# Patient Record
Sex: Female | Born: 1990 | Race: Black or African American | Hispanic: No | Marital: Single | Smoking: Never smoker
Health system: Southern US, Community
[De-identification: ages and names within clinical notes are randomized; demographics above are authoritative.]

## PROBLEM LIST (undated history)

## (undated) ENCOUNTER — Ambulatory Visit: Admission: EM | Payer: BC Managed Care – PPO | Source: Home / Self Care

## (undated) DIAGNOSIS — B009 Herpesviral infection, unspecified: Secondary | ICD-10-CM

## (undated) DIAGNOSIS — K589 Irritable bowel syndrome without diarrhea: Secondary | ICD-10-CM

## (undated) DIAGNOSIS — A749 Chlamydial infection, unspecified: Secondary | ICD-10-CM

## (undated) DIAGNOSIS — K219 Gastro-esophageal reflux disease without esophagitis: Secondary | ICD-10-CM

## (undated) DIAGNOSIS — F988 Other specified behavioral and emotional disorders with onset usually occurring in childhood and adolescence: Secondary | ICD-10-CM

## (undated) DIAGNOSIS — B9689 Other specified bacterial agents as the cause of diseases classified elsewhere: Secondary | ICD-10-CM

## (undated) DIAGNOSIS — N760 Acute vaginitis: Secondary | ICD-10-CM

## (undated) DIAGNOSIS — I82409 Acute embolism and thrombosis of unspecified deep veins of unspecified lower extremity: Secondary | ICD-10-CM

## (undated) DIAGNOSIS — D649 Anemia, unspecified: Secondary | ICD-10-CM

## (undated) HISTORY — PX: NO PAST SURGERIES: SHX2092

## (undated) HISTORY — DX: Anemia, unspecified: D64.9

## (undated) HISTORY — DX: Chlamydial infection, unspecified: A74.9

---
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
## 2010-02-01 ENCOUNTER — Emergency Department (HOSPITAL_COMMUNITY)
Admission: EM | Admit: 2010-02-01 | Discharge: 2010-02-01 | Payer: Self-pay | Source: Home / Self Care | Admitting: Emergency Medicine

## 2011-11-27 ENCOUNTER — Telehealth: Payer: Self-pay | Admitting: Obstetrics and Gynecology

## 2011-11-28 ENCOUNTER — Ambulatory Visit (INDEPENDENT_AMBULATORY_CARE_PROVIDER_SITE_OTHER): Payer: BC Managed Care – PPO | Admitting: Obstetrics and Gynecology

## 2011-11-28 ENCOUNTER — Encounter: Payer: Self-pay | Admitting: Obstetrics and Gynecology

## 2011-11-28 VITALS — BP 92/60 | HR 60 | Ht 63.75 in | Wt 126.0 lb

## 2011-11-28 DIAGNOSIS — Z124 Encounter for screening for malignant neoplasm of cervix: Secondary | ICD-10-CM

## 2011-11-28 DIAGNOSIS — N926 Irregular menstruation, unspecified: Secondary | ICD-10-CM

## 2011-11-28 DIAGNOSIS — Z113 Encounter for screening for infections with a predominantly sexual mode of transmission: Secondary | ICD-10-CM

## 2011-11-28 DIAGNOSIS — Z01419 Encounter for gynecological examination (general) (routine) without abnormal findings: Secondary | ICD-10-CM

## 2011-11-28 NOTE — Progress Notes (Signed)
Regular Periods: no Mammogram: no  Monthly Breast Ex.: yes Exercise: yes  Tetanus < 10 years: yes Seatbelts: yes  NI. Bladder Functn.: yes Abuse at home: no  Daily BM's: yes Stressful Work: yes SCHOOL  Healthy Diet: yes Sigmoid-Colonoscopy: NO  Calcium: no Medical problems this year: CYCLE PROBLEMS   LAST PAP12/11  NL AT [facility]  Contraception: NONE  Mammogram:  NO  PCP: NO  PMH: NO CHANGE  FMH: NO CHANGE  Last Bone Scan: NO  PT IS SINGLE

## 2011-11-28 NOTE — Progress Notes (Signed)
Subjective:    Bridget Mcbride is a 21 y.o. female, G0P0, who presents for an annual exam. The patient reports skipping periods (up to 3 months) but recently has only missed 2 months.  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  Denies molimina during amenorrheic months nor a family history of oligomenorrhea.  Wants to resume birth control pills   Menstrual cycle:   LMP: [name] was 11/08/2011.             Review of Systems Pertinent items are noted in HPI. Denies pelvic pain, urinary tract symptoms, vaginitis symptoms, irregular bleeding, menopausal symptoms, change in bowel habits or rectal bleeding   Objective:    BP 92/60  Pulse 60  Ht 5' 3.75" (1.619 m)  Wt 126 lb (57.153 kg)  BMI 21.80 kg/m2  LMP 11/08/2011   Wt Readings from Last 1 Encounters:  11/28/11 126 lb (57.153 kg)   Body mass index is 21.80 kg/(m^2). General Appearance: Alert, no acute distress HEENT: Grossly normal Neck / Thyroid: Supple, no thyromegaly or cervical adenopathy Lungs: Clear to auscultation bilaterally Back: No CVA tenderness Breast Exam: No masses or nodes.No dimpling, nipple retraction or discharge. Cardiovascular: Regular rate and rhythm.  Gastrointestinal: Soft, non-tender, no masses or organomegaly Pelvic Exam: EGBUS-wnl, vagina-normal rugae, cervix- without lesions or tenderness, uterus appears normal size shape and consistency, adnexae-no masses or tenderness Rectovaginal: no masses and normal sphincter tone Lymphatic Exam: Non-palpable nodes in neck, clavicular,  axillary, or inguinal regions  Skin: no rashes or abnormalities Extremities: no clubbing cyanosis or edema  Neurologic: grossly normal Psychiatric: Alert and oriented  UPT: negative   Assessment:   Routine GYN Exam Irregular Menses   Plan:  STD testing  PAP sent  [facility]  BCP instruction sheet given  RTO 1 year or  prn  Shontel Santee,ELMIRAPA-C

## 2011-11-28 NOTE — Addendum Note (Signed)
Addended by: Tim Lair on: 11/28/2011 06:21 PM   Modules accepted: Orders

## 2011-11-29 LAB — RPR

## 2011-11-29 LAB — HIV ANTIBODY (ROUTINE TESTING W REFLEX): HIV: NONREACTIVE

## 2011-11-29 LAB — HSV 2 ANTIBODY, IGG: HSV 2 Glycoprotein G Ab, IgG: <0.1 IV

## 2011-11-30 LAB — PAP IG, CT-NG, RFX HPV ASCU
Chlamydia Probe Amp: NEGATIVE
GC Probe Amp: NEGATIVE

## 2011-12-02 NOTE — Progress Notes (Signed)
Quick Note:  Patient with LGSIL on PAP smear needs to be scheduled for colposcopy with first available M.D. Thanks, EP ______

## 2011-12-03 ENCOUNTER — Telehealth: Payer: Self-pay

## 2011-12-03 NOTE — Telephone Encounter (Signed)
TC TO PT CONCERNING ABNORMAL PAP. INFORMED PT THAT WE NEED TO SCHEDULE A COLPOSCOPY. WENT OVER COLPOSCOPY PROTOCOL AND SCHEDULED PT WITH ND ON 12-17-11 AT 2:45 PM. WILL MAIL PT A BROCHURE ON THE PROCEDURE. PT VOICED UNDERSTANDING.

## 2011-12-03 NOTE — Telephone Encounter (Signed)
LM FOR PT TO CALL BACK. 

## 2011-12-03 NOTE — Telephone Encounter (Signed)
Message copied by Winfred Leeds on Mon Dec 03, 2011 10:56 AM ------      Message from: Henreitta Leber      Created: Sun Dec 02, 2011  8:13 PM       Patient with LGSIL on PAP smear needs to be scheduled for colposcopy with first available M.D.  Thanks,  EP

## 2011-12-17 ENCOUNTER — Encounter: Payer: Self-pay | Admitting: Obstetrics and Gynecology

## 2011-12-17 ENCOUNTER — Ambulatory Visit (INDEPENDENT_AMBULATORY_CARE_PROVIDER_SITE_OTHER): Payer: BC Managed Care – PPO | Admitting: Obstetrics and Gynecology

## 2011-12-17 VITALS — BP 94/50 | Wt 125.0 lb

## 2011-12-17 DIAGNOSIS — R87612 Low grade squamous intraepithelial lesion on cytologic smear of cervix (LGSIL): Secondary | ICD-10-CM

## 2011-12-17 DIAGNOSIS — R6889 Other general symptoms and signs: Secondary | ICD-10-CM

## 2011-12-17 DIAGNOSIS — IMO0002 Reserved for concepts with insufficient information to code with codable children: Secondary | ICD-10-CM

## 2011-12-17 NOTE — Progress Notes (Signed)
Previous Pap Smear: LSIL HPV MILD DYSPLASIA/CIN1 Previous Colposcopy: n/a Referred From: n/a LMP: 11/08/11 Contraception: none G,P: 0, 0 Pt given Ibuprofen 600 mg in office colpo adequate Aw changes at 6  bx @6  with ecc Rt 6 months for pap Will call with results

## 2011-12-17 NOTE — Patient Instructions (Signed)
Abnormal Pap Test Information  During a Pap test, the cells on the surface of your cervix are checked to see if they look normal, abnormal, or if they show signs of having been altered by a certain type of virus called human papillomavirus, or HPV. Cervical cells that have been affected by HPV are called dysplasia. Dysplasia is not cancer, but describes abnormal cells found on the surface of the cervix. Depending on the degree of dysplasia, some of the cells may be considered pre-cancerous and may turn into cancer over time if follow up with a caregiver is delayed.   WHAT DOES AN ABNORMAL PAP TEST MEAN?  Having an abnormal pap test does not mean that you have cancer. However, certain types of abnormal pap tests can be a sign that a person is at a higher risk of developing cancer. Your caregiver will want to do other tests to find out more about the abnormal cells. Your abnormal Pap test results could show:   · Small and uncertain changes that should be carefully watched.    · Cervical dysplasia that has caused mild changes and can be followed over time.    · Cervical dysplasia that is more severe and needs to be followed and treated to ensure the problem goes away.  · Cancer.    When severe cervical dysplasia is found and treated early, it rarely will grow into cancer.   WHAT WILL BE DONE ABOUT MY ABNORMAL PAP TEST?  · A colposcopy may be needed. This is a procedure where your cervix is examined using light and magnification.  · A small tissue sample of your cervix (biopsy) may need to be removed and then examined. This is often performed if there are areas that appear infected.  · A sample of cells from the cervical canal may be removed with either a small brush or scraping instrument (curette).  Based on the results of the procedures above, some caregivers may recommend either cryotherapy of the cervix or a surgical LEEP where a portion of the cervix is removed. LEEP is short for "loop electrical excisional  procedure." Rarely, a caregiver may recommend a cone biopsy. This is a procedure where a small, cone-shaped sample of your cervix is taken out. The part that is taken out is the area where the abnormal cells are.    WHAT IF I HAVE A DYSPLASIA OR A CANCER?  You may be referred to a specialist. Radiation may also be a treatment for more advanced cancer. Having a hysterectomy is the last treatment option for dysplasia, but it is a more common treatment for someone with cancer. All treatment options will be discussed with you by your caregiver.  WHAT SHOULD YOU DO AFTER BEING TREATED?  If you have had an abnormal pap test, you should continue to have regular pap tests and check-ups as directed by your caregiver. Your cervical problem will be carefully watched so it does not get worse. Also, your caregiver can watch for, and treat, any new problems that may come up.  Document Released: 05/23/2010 Document Revised: 04/30/2011 Document Reviewed: 02/01/2011  ExitCare® Patient Information ©2013 [employer].

## 2011-12-23 ENCOUNTER — Encounter (HOSPITAL_COMMUNITY): Payer: Self-pay | Admitting: *Deleted

## 2011-12-23 ENCOUNTER — Emergency Department (INDEPENDENT_AMBULATORY_CARE_PROVIDER_SITE_OTHER)
Admission: EM | Admit: 2011-12-23 | Discharge: 2011-12-23 | Disposition: A | Discharge status: Home / Self Care | Payer: Self-pay | Source: Home / Self Care | Attending: Emergency Medicine | Admitting: Emergency Medicine

## 2011-12-23 DIAGNOSIS — M549 Dorsalgia, unspecified: Secondary | ICD-10-CM

## 2011-12-23 MED ORDER — IBUPROFEN 800 MG PO TABS
ORAL_TABLET | ORAL | Status: AC
  Filled 2011-12-23: qty 1

## 2011-12-23 MED ORDER — IBUPROFEN 800 MG PO TABS
800.0000 mg | ORAL_TABLET | Freq: Once | ORAL | Status: AC
  Administered 2011-12-23: 800 mg via ORAL

## 2011-12-23 MED ORDER — CYCLOBENZAPRINE HCL 5 MG PO TABS: 5.0000 mg | ORAL_TABLET | Freq: Three times a day (TID) | ORAL | Status: DC | PRN

## 2011-12-23 MED ORDER — NAPROXEN 375 MG PO TABS: 375.0000 mg | ORAL_TABLET | Freq: Two times a day (BID) | ORAL | Status: DC
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
## 2011-12-23 NOTE — ED Provider Notes (Signed)
Medical screening examination/treatment/procedure(s) were performed by non-physician practitioner and as supervising physician I was immediately available for consultation/collaboration.  Raynald Blend, MD 12/23/11 1642

## 2011-12-24 LAB — POCT URINALYSIS DIP (DEVICE)
Hgb urine dipstick: NEGATIVE
Protein, ur: 30 mg/dL — AB
Specific Gravity, Urine: 1.025 (ref 1.005–1.030)
pH: 6.5 (ref 5.0–8.0)

## 2011-12-26 ENCOUNTER — Telehealth: Payer: Self-pay

## 2011-12-26 NOTE — Telephone Encounter (Signed)
Message copied by Rolla Plate on Wed Dec 26, 2011  9:12 AM ------      Message from: Jaymes Graff      Created: Mon Dec 24, 2011  8:28 PM       Please review colpo results with patient and tell her I recommend a pap every six months for the next year.

## 2011-12-26 NOTE — Telephone Encounter (Signed)
Lm on vm tcb rgd labs 

## 2011-12-31 NOTE — Telephone Encounter (Signed)
Spoke with pt rgd labs informed repeat pap every 6 months pt voice understanding 

## 2011-12-31 NOTE — Telephone Encounter (Signed)
Lm on vm tcb rgd labs 

## 2012-02-18 ENCOUNTER — Telehealth: Payer: Self-pay | Admitting: Obstetrics and Gynecology

## 2012-02-18 NOTE — Telephone Encounter (Signed)
Spoke with pt Bridget Mcbride msg pt c/o vag irritation pt has appt 02/29/12 with nd pt voice understanding

## 2012-02-29 ENCOUNTER — Encounter: Payer: BC Managed Care – PPO | Admitting: Obstetrics and Gynecology

## 2012-02-29 ENCOUNTER — Telehealth: Payer: Self-pay | Admitting: Obstetrics and Gynecology

## 2012-02-29 NOTE — Telephone Encounter (Signed)
Tc to pt per telephone call. Appt sched 03/03/12 @ 3:15 with ND for eval. Pt agrees

## 2012-03-03 ENCOUNTER — Encounter: Payer: Self-pay | Admitting: Obstetrics and Gynecology

## 2012-03-03 ENCOUNTER — Ambulatory Visit (INDEPENDENT_AMBULATORY_CARE_PROVIDER_SITE_OTHER): Payer: BC Managed Care – PPO | Admitting: Obstetrics and Gynecology

## 2012-03-03 VITALS — BP 84/48 | Wt 123.0 lb

## 2012-03-03 DIAGNOSIS — N898 Other specified noninflammatory disorders of vagina: Secondary | ICD-10-CM

## 2012-03-03 DIAGNOSIS — A499 Bacterial infection, unspecified: Secondary | ICD-10-CM

## 2012-03-03 DIAGNOSIS — N760 Acute vaginitis: Secondary | ICD-10-CM

## 2012-03-03 DIAGNOSIS — B9689 Other specified bacterial agents as the cause of diseases classified elsewhere: Secondary | ICD-10-CM

## 2012-03-03 LAB — POCT WET PREP (WET MOUNT)
Clue Cells Wet Prep Whiff POC: POSITIVE
PH, VAGINAL: 5.5

## 2012-03-03 MED ORDER — METRONIDAZOLE 500 MG PO TABS: 500.0000 mg | ORAL_TABLET | Freq: Two times a day (BID) | ORAL | Status: DC

## 2012-03-03 NOTE — Patient Instructions (Signed)
Bacterial Vaginosis Bacterial vaginosis (BV) is a vaginal infection where the normal balance of bacteria in the vagina is disrupted. The normal balance is then replaced by an overgrowth of certain bacteria. There are several different kinds of bacteria that can cause BV. BV is the most common vaginal infection in women of childbearing age. CAUSES   The cause of BV is not fully understood. BV develops when there is an increase or imbalance of harmful bacteria.  Some activities or behaviors can upset the normal balance of bacteria in the vagina and put women at increased risk including:  Having a new sex partner or multiple sex partners.  Douching.  Using an intrauterine device (IUD) for contraception.  It is not clear what role sexual activity plays in the development of BV. However, women that have never had sexual intercourse are rarely infected with BV. Women do not get BV from toilet seats, bedding, swimming pools or from touching objects around them.  SYMPTOMS   Grey vaginal discharge.  A fish-like odor with discharge, especially after sexual intercourse.  Itching or burning of the vagina and vulva.  Burning or pain with urination.  Some women have no signs or symptoms at all. DIAGNOSIS  Your caregiver must examine the vagina for signs of BV. Your caregiver will perform lab tests and look at the sample of vaginal fluid through a microscope. They will look for bacteria and abnormal cells (clue cells), a pH test higher than 4.5, and a positive amine test all associated with BV.  RISKS AND COMPLICATIONS   Pelvic inflammatory disease (PID).  Infections following gynecology surgery.  Developing HIV.  Developing herpes virus. TREATMENT  Sometimes BV will clear up without treatment. However, all women with symptoms of BV should be treated to avoid complications, especially if gynecology surgery is planned. Female partners generally do not need to be treated. However, BV may spread  between female sex partners so treatment is helpful in preventing a recurrence of BV.   BV may be treated with antibiotics. The antibiotics come in either pill or vaginal cream forms. Either can be used with nonpregnant or pregnant women, but the recommended dosages differ. These antibiotics are not harmful to the baby.  BV can recur after treatment. If this happens, a second round of antibiotics will often be prescribed.  Treatment is important for pregnant women. If not treated, BV can cause a premature delivery, especially for a pregnant woman who had a premature birth in the past. All pregnant women who have symptoms of BV should be checked and treated.  For chronic reoccurrence of BV, treatment with a type of prescribed gel vaginally twice a week is helpful. HOME CARE INSTRUCTIONS   Finish all medication as directed by your caregiver.  Do not have sex until treatment is completed.  Tell your sexual partner that you have a vaginal infection. They should see their caregiver and be treated if they have problems, such as a mild rash or itching.  Practice safe sex. Use condoms. Only have 1 sex partner. PREVENTION  Basic prevention steps can help reduce the risk of upsetting the natural balance of bacteria in the vagina and developing BV:  Do not have sexual intercourse (be abstinent).  Do not douche.  Use all of the medicine prescribed for treatment of BV, even if the signs and symptoms go away.  Tell your sex partner if you have BV. That way, they can be treated, if needed, to prevent reoccurrence. SEEK MEDICAL CARE IF:     Your symptoms are not improving after 3 days of treatment.  You have increased discharge, pain, or fever. MAKE SURE YOU:   Understand these instructions.  Will watch your condition.  Will get help right away if you are not doing well or get worse. FOR MORE INFORMATION  Division of STD Prevention (DSTDP), Centers for Disease Control and Prevention:  www.cdc.gov/std American Social Health Association (ASHA): www.ashastd.org  Document Released: 02/05/2005 Document Revised: 04/30/2011 Document Reviewed: 07/29/2008 ExitCare Patient Information 2013 [employer].  

## 2012-03-03 NOTE — Progress Notes (Signed)
Color: white Odor: yes Itching:no Thin:yes Thick:no Fever:no Dyspareunia:no Hx PID:no HX STD:no Pelvic Pain:no Desires Gc/CT:no Desires HIV,RPR,HbsAG:no Pt would like to be checked for bv.  She states that the discharge has been present for two months BP 84/48  Wt 123 lb (55.792 kg)  LMP 12/01/2011 Physical Examination: Abdomen - soft, nontender, nondistended, no masses or organomegaly Physical Examination: Pelvic - normal external genitalia, vulva, vagina, cervix, uterus and adnexa, VULVA: normal appearing vulva with no masses, tenderness or lesions Physical Examination: Pelvic - normal external genitalia, vulva, vagina, cervix, uterus and adnexa, VAGINA: normal appearing vagina with normal color and discharge, no lesions, vaginal discharge - white and malodorous, WET MOUNT done - results: clue cells, CERVIX: normal appearing cervix without discharge or lesions, UTERUS: uterus is normal size, shape, consistency and nontender, ADNEXA: normal adnexa in size, nontender and no masses osom positive for BV BV Flagyl rx given to pt.  Perineal hygeine reviewed

## 2012-03-17 ENCOUNTER — Encounter: Payer: BC Managed Care – PPO | Admitting: Obstetrics and Gynecology

## 2012-04-02 ENCOUNTER — Telehealth: Payer: Self-pay | Admitting: Obstetrics and Gynecology

## 2012-04-02 NOTE — Telephone Encounter (Signed)
Lm on vm tcb rgd msg 

## 2012-04-08 ENCOUNTER — Ambulatory Visit: Payer: BC Managed Care – PPO | Admitting: Family Medicine

## 2012-04-08 ENCOUNTER — Encounter: Payer: Self-pay | Admitting: Family Medicine

## 2012-04-08 VITALS — BP 92/62 | Ht 64.0 in | Wt 123.0 lb

## 2012-04-08 DIAGNOSIS — N898 Other specified noninflammatory disorders of vagina: Secondary | ICD-10-CM

## 2012-04-08 DIAGNOSIS — N760 Acute vaginitis: Secondary | ICD-10-CM

## 2012-04-08 LAB — POCT WET PREP (WET MOUNT)
Clue Cells Wet Prep Whiff POC: POSITIVE
pH: 5

## 2012-04-08 NOTE — Progress Notes (Signed)
Vaginal Discharge/Discomfort/Itching  Subjective: BP 92/62  Ht 5\' 4"  (1.626 m)  Wt 123 lb (55.792 kg)  BMI 21.1 kg/m2  LMP 11/02/2011    Bridget Mcbride is an 22 y.o. woman who presents c/o vaginal discharge: large amount, thin white to grey with bad odor. Symptoms have been present for 2 days.  Seen in the office 3 weeks ago and used the treatment 2 weeks ago with improvement x 2 days and smell returned.  Reports following hygiene measures and changing workout clothes as soon as possible. Patient reports this has happened often and want a cure.  Associated symptoms: no pelvic pain, dyspareunia, or bleeding.  States irregular menses for last 2-3 years and uses no contraception.  STD screen:declined       Objective: No hair d/t shaving. Vagina: WNL, Labia: covered with whitish-grey fishy discharge. BSU: normal. Cervix: nonparous os, No CMT,  No discharge.  Uterus:WNL. No adnexa tenderness or masses.    Wet prep: vaginal pH is 5.5,Wet mount positive for clues, no yeast.  Plan: 1. BV: Will treat with Metrogel 0.75% qhs intravaginally for 5 days. Rx given.  Encouraged patient to avoid intercourse during treatment.   Discussed with may need prophylactic treatment which includes, metronidazole gel twice weekly for 4-6 months, which has been shown to reduce recurrences, although this benefit might not persist when suppressive therapy is discontinued.   2. Patient declines PT and contraception at this time.   Counseling:  treatment options Metrogel vs. Pill  Follow-up: prn   Elane Fritz FNP-BC 04/08/2012 1:54 PM

## 2012-04-08 NOTE — Patient Instructions (Signed)
Bacterial Vaginosis Bacterial vaginosis (BV) is a vaginal infection where the normal balance of bacteria in the vagina is disrupted. The normal balance is then replaced by an overgrowth of certain bacteria. There are several different kinds of bacteria that can cause BV. BV is the most common vaginal infection in women of childbearing age. CAUSES   The cause of BV is not fully understood. BV develops when there is an increase or imbalance of harmful bacteria.  Some activities or behaviors can upset the normal balance of bacteria in the vagina and put women at increased risk including:  Having a new sex partner or multiple sex partners.  Douching.  Using an intrauterine device (IUD) for contraception.  It is not clear what role sexual activity plays in the development of BV. However, women that have never had sexual intercourse are rarely infected with BV. Women do not get BV from toilet seats, bedding, swimming pools or from touching objects around them.  SYMPTOMS   Grey vaginal discharge.  A fish-like odor with discharge, especially after sexual intercourse.  Itching or burning of the vagina and vulva.  Burning or pain with urination.  Some women have no signs or symptoms at all. DIAGNOSIS  Your caregiver must examine the vagina for signs of BV. Your caregiver will perform lab tests and look at the sample of vaginal fluid through a microscope. They will look for bacteria and abnormal cells (clue cells), a pH test higher than 4.5, and a positive amine test all associated with BV.  RISKS AND COMPLICATIONS   Pelvic inflammatory disease (PID).  Infections following gynecology surgery.  Developing HIV.  Developing herpes virus. TREATMENT  Sometimes BV will clear up without treatment. However, all women with symptoms of BV should be treated to avoid complications, especially if gynecology surgery is planned. Female partners generally do not need to be treated. However, BV may spread  between female sex partners so treatment is helpful in preventing a recurrence of BV.   BV may be treated with antibiotics. The antibiotics come in either pill or vaginal cream forms. Either can be used with nonpregnant or pregnant women, but the recommended dosages differ. These antibiotics are not harmful to the baby.  BV can recur after treatment. If this happens, a second round of antibiotics will often be prescribed.  Treatment is important for pregnant women. If not treated, BV can cause a premature delivery, especially for a pregnant woman who had a premature birth in the past. All pregnant women who have symptoms of BV should be checked and treated.  For chronic reoccurrence of BV, treatment with a type of prescribed gel vaginally twice a week is helpful. HOME CARE INSTRUCTIONS   Finish all medication as directed by your caregiver.  Do not have sex until treatment is completed.  Tell your sexual partner that you have a vaginal infection. They should see their caregiver and be treated if they have problems, such as a mild rash or itching.  Practice safe sex. Use condoms. Only have 1 sex partner. PREVENTION  Basic prevention steps can help reduce the risk of upsetting the natural balance of bacteria in the vagina and developing BV:  Do not have sexual intercourse (be abstinent).  Do not douche.  Use all of the medicine prescribed for treatment of BV, even if the signs and symptoms go away.  Tell your sex partner if you have BV. That way, they can be treated, if needed, to prevent reoccurrence. SEEK MEDICAL CARE IF:     Your symptoms are not improving after 3 days of treatment.  You have increased discharge, pain, or fever. MAKE SURE YOU:   Understand these instructions.  Will watch your condition.  Will get help right away if you are not doing well or get worse. FOR MORE INFORMATION  Division of STD Prevention (DSTDP), Centers for Disease Control and Prevention:  www.cdc.gov/std American Social Health Association (ASHA): www.ashastd.org  Document Released: 02/05/2005 Document Revised: 04/30/2011 Document Reviewed: 07/29/2008 ExitCare Patient Information 2013 [employer].  

## 2012-04-09 ENCOUNTER — Encounter: Payer: BC Managed Care – PPO | Admitting: Family Medicine

## 2012-04-17 ENCOUNTER — Other Ambulatory Visit: Payer: Self-pay | Admitting: Family Medicine

## 2012-04-17 ENCOUNTER — Telehealth: Payer: Self-pay | Admitting: Family Medicine

## 2012-04-17 DIAGNOSIS — B3731 Acute candidiasis of vulva and vagina: Secondary | ICD-10-CM

## 2012-04-17 MED ORDER — FLUCONAZOLE 150 MG PO TABS: 150.0000 mg | ORAL_TABLET | Freq: Once | ORAL | Status: DC

## 2012-04-17 NOTE — Telephone Encounter (Signed)
Returned pt's call regarding symptoms of BV returned. Pt stated that she finished RX for BV and is now having thick white discharge. Pt was advised per NP LC to pick-up RX for yeast infection, Diflucan (to be E-scribed) Pt to call the office if symptoms do not resolve or worsen. Pt voice understanding. Mathis Bud

## 2012-05-01 ENCOUNTER — Ambulatory Visit: Payer: BC Managed Care – PPO | Admitting: Obstetrics and Gynecology

## 2012-05-01 ENCOUNTER — Encounter: Payer: Self-pay | Admitting: Obstetrics and Gynecology

## 2012-05-01 VITALS — BP 110/70 | HR 68 | Wt 126.0 lb

## 2012-05-01 DIAGNOSIS — N912 Amenorrhea, unspecified: Secondary | ICD-10-CM

## 2012-05-01 DIAGNOSIS — R590 Localized enlarged lymph nodes: Secondary | ICD-10-CM

## 2012-05-01 DIAGNOSIS — N9089 Other specified noninflammatory disorders of vulva and perineum: Secondary | ICD-10-CM

## 2012-05-01 DIAGNOSIS — N898 Other specified noninflammatory disorders of vagina: Secondary | ICD-10-CM

## 2012-05-01 DIAGNOSIS — N915 Oligomenorrhea, unspecified: Secondary | ICD-10-CM

## 2012-05-01 LAB — CBC
HCT: 42.3 % (ref 36.0–46.0)
Hemoglobin: 14 g/dL (ref 12.0–15.0)
MCHC: 33.1 g/dL (ref 30.0–36.0)
MCV: 86.5 fL (ref 78.0–100.0)
WBC: 6.4 10*3/uL (ref 4.0–10.5)

## 2012-05-01 LAB — PROGESTERONE: Progesterone: 0.4 ng/mL

## 2012-05-01 LAB — LUTEINIZING HORMONE: LH: 37 m[IU]/mL

## 2012-05-01 LAB — PROLACTIN: Prolactin: 9.9 ng/mL

## 2012-05-01 MED ORDER — VALACYCLOVIR HCL 500 MG PO TABS: ORAL_TABLET | ORAL | Status: DC

## 2012-05-01 MED ORDER — ACYCLOVIR 5 % EX CREA: 1.0000 "application " | TOPICAL_CREAM | CUTANEOUS | Status: DC

## 2012-05-01 NOTE — Progress Notes (Signed)
21 YO complains of an asymptomatic vaginal discharge and amenorrhea (LMP: 11/02/11).  Has a history of oligomenorrhea but has never been worked up. Menstrual flow is for 6 days with pad change every 3-4 hours  O: Abdomen: soft, non-tender without masses      Pelvic: EGBUS-tender erosion in right inferior vaginal area, vagina-normal with copious white discharge, cervix-no lesions and without tenderness,    uterus-normal size and adnexae-no tenderness or masses  Wet Prep:  ph-4.5,  whiff-negative HSV culture-pending   A: Oligomenorrhea      Recurrent Vulvar Lesions      Recurrent Inguinal Adenopathy      H/O HSV-1 (unknown site)   P:  [facility]       Reviewed physiologic vaginal discharge       Zovirax 5% 15 gm  apply every 3 hours x 10 days      Valtrex 500 mg  #30  bid x 3-5 days prn       HSV culture pending       RTO-as scheduled or prn  Tynslee Bowlds, PA-C

## 2012-05-01 NOTE — Progress Notes (Signed)
Vaginal discharge: mucoidthick mucoid Itching / Burning: no Fever: no  Symptoms have been present for 5 days. Has used over-the-counter treatment: no Associated symptoms:  Pelvic pain: no       Dyspareunia: no     Odor:  no  History of STD:  history of chlamydia STD screen:declined  Pt states she had a little knot on her right side  Over the weekend. Pt states the knot come and go. Also pt has not had a cycle since 9/13.

## 2012-05-01 NOTE — Patient Instructions (Signed)
Avoid: - excess soap on genital area (consider using plain oatmeal soap) - use of powder or sprays in genital area - douching - wearing underwear to bed (except with menses) - using more than is directed detergent when washing clothes - tight fitting garments around genital area - excess sugar intake   

## 2012-05-02 LAB — TSH: TSH: 2.836 u[IU]/mL (ref 0.350–4.500)

## 2012-05-02 LAB — TESTOSTERONE, FREE, TOTAL, SHBG
Testosterone-% Free: 1.1 % (ref 0.4–2.4)
Testosterone: 74 ng/dL — ABNORMAL HIGH (ref 10–70)

## 2012-05-02 LAB — HSV 2 ANTIBODY, IGG: HSV 2 Glycoprotein G Ab, IgG: 0.18 IV

## 2012-05-06 ENCOUNTER — Telehealth: Payer: Self-pay

## 2012-05-06 NOTE — Telephone Encounter (Signed)
Message copied by Rolla Plate on Tue May 06, 2012 11:01 AM ------      Message from: Henreitta Leber      Created: Mon May 05, 2012  8:55 AM       Please advise patient that she has polycystic ovarian syndrome and elevated testosterone.  If  she would like to discuss further, she can make an appointment.  Thank you,  EP ------

## 2012-05-06 NOTE — Telephone Encounter (Signed)
Spoke with pt rgd labs informed results pt has appt 05/14/12 at 2:45 with EP to discuss results pt voice understanding

## 2012-09-26 ENCOUNTER — Emergency Department (HOSPITAL_COMMUNITY)
Admission: EM | Admit: 2012-09-26 | Discharge: 2012-09-26 | Disposition: A | Discharge status: Home / Self Care | Payer: BC Managed Care – PPO | Source: Home / Self Care | Attending: Family Medicine | Admitting: Family Medicine

## 2012-09-26 ENCOUNTER — Encounter (HOSPITAL_COMMUNITY): Payer: Self-pay | Admitting: Emergency Medicine

## 2012-09-26 DIAGNOSIS — K644 Residual hemorrhoidal skin tags: Secondary | ICD-10-CM

## 2012-09-26 MED ORDER — LOPERAMIDE HCL 2 MG PO CAPS: 2.0000 mg | ORAL_CAPSULE | Freq: Four times a day (QID) | ORAL | Status: DC | PRN

## 2012-09-26 MED ORDER — TRIAMCINOLONE ACETONIDE 0.5 % EX OINT: TOPICAL_OINTMENT | Freq: Two times a day (BID) | CUTANEOUS | Status: DC

## 2012-09-26 MED ORDER — LIDOCAINE 5 % EX OINT
TOPICAL_OINTMENT | CUTANEOUS | Status: DC | PRN
Start: 2012-09-26 — End: 2013-08-03

## 2012-09-26 NOTE — ED Provider Notes (Signed)
CSN: [id number]     Arrival date & time 09/26/12  1132 History     First MD Initiated Contact with Patient 09/26/12 1204     Chief Complaint  Patient presents with  . Hemorrhoids   (Consider location/radiation/quality/duration/timing/severity/associated sxs/prior Treatment) HPI Comments: 22 year old female here complaining of rectal tenderness, swelling and pain since yesterday. States she has had constipation for several days followed or diarrhea in the last few days after completing antibiotics for Chlamydia infection about 2 weeks ago. Has not used any type of medications for her symptoms. Still having 1-3 small runny bowel movements per day. Denies abdominal pain. No nausea vomiting.   Past Medical History  Diagnosis Date  . Chlamydia   . Anemia    History reviewed. No pertinent past surgical history. Family History  Problem Relation Age of Onset  . Hypertension Mother   . Migraines Father   . Hypertension Maternal Aunt   . Hypertension Maternal Uncle   . Stroke Maternal Uncle   . Diabetes Paternal Aunt   . Arthritis Paternal Grandmother   . Hypertension Maternal Aunt   . Hypertension Maternal Aunt   . Hypertension Maternal Aunt   . Stroke Cousin    History  Substance Use Topics  . Smoking status: Never Smoker   . Smokeless tobacco: Never Used  . Alcohol Use: Yes     Comment: occasional   OB History   Grav Para Term Preterm Abortions TAB SAB Ect Mult Living   0              Review of Systems  Constitutional: Negative for fever, chills and appetite change.  Gastrointestinal: Positive for diarrhea and rectal pain. Negative for nausea, vomiting and abdominal pain.  Skin: Negative for rash.  All other systems reviewed and are negative.    Allergies  Review of patient's allergies indicates no known allergies.  Home Medications   Current Outpatient Rx  Name  Route  Sig  Dispense  Refill  . drospirenone-ethinyl estradiol (YASMIN,ZARAH,SYEDA) 3-0.03 MG tablet   Oral   Take 1 tablet by mouth daily.         Marland Kitchen acyclovir cream (ZOVIRAX) 5 %   Topical   Apply 1 application topically every 3 (three) hours.   15 g   0   . lidocaine (XYLOCAINE) 5 % ointment   Topical   Apply topically as needed.   35.44 g   0   . loperamide (IMODIUM) 2 MG capsule   Oral   Take 1 capsule (2 mg total) by mouth 4 (four) times daily as needed for diarrhea or loose stools.   30 capsule   0   . triamcinolone ointment (KENALOG) 0.5 %   Topical   Apply topically 2 (two) times daily.   30 g   0   . valACYclovir (VALTREX) 500 MG tablet      1 po bid  3-5 days   30 tablet   11    BP 105/70  Pulse 75  Temp(Src) 99.3 F (37.4 C) (Oral)  Resp 18  SpO2 98%  LMP 09/02/2012 Physical Exam  Nursing note and vitals reviewed. Constitutional: She is oriented to person, place, and time. She appears well-developed and well-nourished. No distress.  HENT:  Head: Normocephalic and atraumatic.  Cardiovascular: Normal heart sounds.   Pulmonary/Chest: Breath sounds normal.  Abdominal: Soft. Bowel sounds are normal. She exhibits no distension and no mass. There is no tenderness.  Genitourinary:  Rectal exam: There is a  large external hemorrhoid at 6:00 o'clock. Appears pink swollen and irritated, fluctuant, no hard consistency, tender to palpation. No mucosal ulcers or exudates. Anal tone is normal. No internal hemorrhoids felt, no rectal masses there is no stools in the vault.  Neurological: She is alert and oriented to person, place, and time.  Skin: No rash noted. She is not diaphoretic.    ED Course   Procedures (including critical care time)  Labs Reviewed - No data to display No results found. 1. Inflamed external hemorrhoid     MDM  Impress irritated external hemorrhoid. Prescribed lidocaine ointment and triamcinolone ointment. As per patient request also prescribe Imodium to help stop diarrhea. Supportive care and red flags that should prompt her  return to medical attention discussed with patient and provided in writing.  Sharin Grave, MD 09/26/12 1313

## 2012-09-26 NOTE — ED Notes (Signed)
Pt c/o external hemorrhoids onset yest. Reports she has been constipated and having diarrhea for the past 2 weeks sxs include: tenderness, swelling at rectum area... Denies: blood in toilet/toilet paper Taking OTC meds for diarrhea... Alert w/no signs of acute distress.

## 2012-09-28 ENCOUNTER — Emergency Department (INDEPENDENT_AMBULATORY_CARE_PROVIDER_SITE_OTHER)
Admission: EM | Admit: 2012-09-28 | Discharge: 2012-09-28 | Discharge status: Against Medical Advice | Payer: BC Managed Care – PPO | Source: Home / Self Care | Attending: Emergency Medicine | Admitting: Emergency Medicine

## 2012-09-28 ENCOUNTER — Emergency Department (INDEPENDENT_AMBULATORY_CARE_PROVIDER_SITE_OTHER): Payer: BC Managed Care – PPO

## 2012-09-28 ENCOUNTER — Encounter (HOSPITAL_COMMUNITY): Payer: Self-pay | Admitting: *Deleted

## 2012-09-28 DIAGNOSIS — R1031 Right lower quadrant pain: Secondary | ICD-10-CM

## 2012-09-28 HISTORY — DX: Other specified bacterial agents as the cause of diseases classified elsewhere: N76.0

## 2012-09-28 HISTORY — DX: Gastro-esophageal reflux disease without esophagitis: K21.9

## 2012-09-28 HISTORY — DX: Other specified bacterial agents as the cause of diseases classified elsewhere: B96.89

## 2012-09-28 LAB — POCT URINALYSIS DIP (DEVICE)
Glucose, UA: NEGATIVE mg/dL
Hgb urine dipstick: NEGATIVE
Ketones, ur: NEGATIVE mg/dL
Specific Gravity, Urine: 1.025 (ref 1.005–1.030)

## 2012-09-28 LAB — POCT PREGNANCY, URINE: Preg Test, Ur: NEGATIVE

## 2012-09-28 LAB — OCCULT BLOOD, POC DEVICE: Fecal Occult Bld: NEGATIVE

## 2012-09-28 NOTE — ED Notes (Signed)
Pt states she will sign out AMA and "go in the morning".  Risks again reviewed with pt.  Informed pt she needs to go to ED immediately for any worsening sxs.  Pt verbalized understanding.

## 2012-09-28 NOTE — ED Provider Notes (Signed)
Medical screening examination/treatment/procedure(s) were performed by -physician practitioner and as supervising physician I was immediately available for consultation/collaboration. We discuss this case and they some information and providers exam seem to be reasonable to pursue further imaging studies. I agreed with plan of care as it transfer the patient to the emergency department.  Raynald Blend, MD 09/28/12 [phone]

## 2012-09-28 NOTE — ED Notes (Signed)
States was told to f/u if any abd pain.  States started with intermittent generalized abd cramping yesterday.  Denies any blood in stool; states stool is "stringy".  Has taken 4 doses Imodium with only slight decrease in frequency of diarrhea.  Has some nausea, but denies vomiting or fevers.  Denies vaginal discharge - reports being treated for BV 2 wks ago.

## 2012-09-28 NOTE — ED Notes (Signed)
Pt states she is unsure if she wants to go to ED due to co-pay; informed pt of risk of not having further evaluation and that she will have to sign out AMA if she chooses to forego further treatment.  Pt discussing with family member.

## 2012-09-28 NOTE — ED Provider Notes (Signed)
CSN: [id number]     Arrival date & time 09/28/12  1348 History     None    Chief Complaint  Patient presents with  . Abdominal Pain    HPI Pt is 22 yo F presenting with progressively worsening abdominal pain. She was evaluated at [facility] 2 days ago for complaint of rectal pain, constipation with intermittent diarrhea. She has been taking imodium as well as steroid cream. Her abd pain has been getting worse and is now diffuse cramping pain.  Previously her abd pain was only with bowel movement. Now she has sharp pain in lower abdomen. Pain is worse before the bowel movement. Pain is better if she is lying flat. She has never had anything like this before.  Currently her bowel movements have been "stringy" for the last week. She states she would have loose bowel movements then feel like she still needed to defecate (almost like she was constipated.) She has been taking Imodium and stool is getting harder. No travel, no new foods. No fevers, no sick contacts. She took Flagyl 2 weeks ago but states she had diarrhea before then.   Past Medical History  Diagnosis Date  . Chlamydia   . Anemia   . BV (bacterial vaginosis)   . GERD (gastroesophageal reflux disease)    History reviewed. No pertinent past surgical history. Family History  Problem Relation Age of Onset  . Hypertension Mother   . Migraines Father   . Hypertension Maternal Aunt   . Hypertension Maternal Uncle   . Stroke Maternal Uncle   . Diabetes Paternal Aunt   . Arthritis Paternal Grandmother   . Hypertension Maternal Aunt   . Hypertension Maternal Aunt   . Hypertension Maternal Aunt   . Stroke Cousin    History  Substance Use Topics  . Smoking status: Never Smoker   . Smokeless tobacco: Never Used  . Alcohol Use: Yes     Comment: occasional   OB History   Grav Para Term Preterm Abortions TAB SAB Ect Mult Living   0              Review of Systems  Allergies  Review of patient's allergies indicates no known  allergies.  Home Medications   Current Outpatient Rx  Name  Route  Sig  Dispense  Refill  . drospirenone-ethinyl estradiol (YASMIN,ZARAH,SYEDA) 3-0.03 MG tablet   Oral   Take 1 tablet by mouth daily.         Marland Kitchen lidocaine (XYLOCAINE) 5 % ointment   Topical   Apply topically as needed.   35.44 g   0   . loperamide (IMODIUM) 2 MG capsule   Oral   Take 1 capsule (2 mg total) by mouth 4 (four) times daily as needed for diarrhea or loose stools.   30 capsule   0   . triamcinolone ointment (KENALOG) 0.5 %   Topical   Apply topically 2 (two) times daily.   30 g   0   . acyclovir cream (ZOVIRAX) 5 %   Topical   Apply 1 application topically every 3 (three) hours.   15 g   0   . valACYclovir (VALTREX) 500 MG tablet      1 po bid  3-5 days   30 tablet   11    BP 113/66  Pulse 84  Temp(Src) 99.8 F (37.7 C) (Oral)  Resp 18  SpO2 95%  LMP 09/02/2012 Physical Exam  Constitutional: She is oriented to person,  place, and time. She appears well-developed and well-nourished. No distress.  HENT:  Head: Normocephalic and atraumatic.  Eyes: Pupils are equal, round, and reactive to light.  Neck: Normal range of motion.  Cardiovascular: Normal rate, regular rhythm and normal heart sounds.   Pulmonary/Chest: Effort normal and breath sounds normal.  Abdominal:  Thin female. No epigastric tenderness. Localized RLQ pain with light palpation. Some TTP suprapubic as well but no rebound.  Musculoskeletal: Normal range of motion. She exhibits no edema.  Neurological: She is alert and oriented to person, place, and time.  Skin: Skin is dry. No rash noted.    ED Course   Procedures (including critical care time)  Labs Reviewed  POCT URINALYSIS DIP (DEVICE)  POCT PREGNANCY, URINE  OCCULT BLOOD, POC DEVICE   Dg Abd 1 View  09/28/2012   *RADIOLOGY REPORT*  Clinical Data: Abdominal pain, diarrhea  ABDOMEN - 1 VIEW  Comparison: None.  Findings: Paucity of small bowel gas.  Stomach  and colon are nondilated.  No abnormal abdominal calcifications.  Regional bones unremarkable.  IMPRESSION:  1. Negative   Original Report Authenticated By: D. Andria Rhein, MD   1. RLQ abdominal pain     MDM  Upreg, FOBT, UA reviewed and negative KUB ordered due to concern for stool burden but this was negative as well. Given patient's localized RLQ pain without other explanation here with negative imaging, I feel it is best she be transferred to the ED for further evaluation. Discussed with patient; she refused transfer to the ED and signed out AMA.  Hilarie Fredrickson, MD 09/28/12 1523  Hilarie Fredrickson, MD 09/28/12 [phone]

## 2013-05-03 LAB — HM PAP SMEAR: HM PAP: NORMAL

## 2013-08-03 ENCOUNTER — Telehealth: Payer: Self-pay

## 2013-08-03 NOTE — Telephone Encounter (Signed)
Medication and allergies:  Reviewed and updated  90 day supply/mail order: n/a Local pharmacy:  [id number] - [place], [place] - [id number]   Immunizations due:  UTD    A/P: No changes to personal, family history or past surgical hx PAP- 04/2013- normal per patient Flu- did not receive Tdap- less than 10 years ago  To Discuss with Provider: Nothing at this time.

## 2013-08-04 ENCOUNTER — Ambulatory Visit (INDEPENDENT_AMBULATORY_CARE_PROVIDER_SITE_OTHER): Payer: BC Managed Care – PPO | Admitting: Family Medicine

## 2013-08-04 ENCOUNTER — Encounter: Payer: Self-pay | Admitting: Family Medicine

## 2013-08-04 VITALS — BP 102/70 | HR 88 | Temp 98.0°F | Resp 16 | Ht 64.5 in | Wt 123.4 lb

## 2013-08-04 DIAGNOSIS — Z0000 Encounter for general adult medical examination without abnormal findings: Secondary | ICD-10-CM | POA: Insufficient documentation

## 2013-08-04 LAB — HEPATIC FUNCTION PANEL
ALT: 12 U/L (ref 0–35)
AST: 14 U/L (ref 0–37)
Albumin: 3.7 g/dL (ref 3.5–5.2)
Alkaline Phosphatase: 42 U/L (ref 39–117)
Bilirubin, Direct: 0 mg/dL (ref 0.0–0.3)
TOTAL PROTEIN: 7.3 g/dL (ref 6.0–8.3)
Total Bilirubin: 0.2 mg/dL (ref 0.2–1.2)

## 2013-08-04 LAB — BASIC METABOLIC PANEL
BUN: 14 mg/dL (ref 6–23)
CO2: 25 mEq/L (ref 19–32)
Calcium: 9.1 mg/dL (ref 8.4–10.5)
Chloride: 102 mEq/L (ref 96–112)
Creatinine, Ser: 1 mg/dL (ref 0.4–1.2)
GFR: 90.74 mL/min (ref >60.00)
GLUCOSE: 81 mg/dL (ref 70–99)
Potassium: 3.9 mEq/L (ref 3.5–5.1)
Sodium: 133 mEq/L — ABNORMAL LOW (ref 135–145)

## 2013-08-04 LAB — CBC WITH DIFFERENTIAL/PLATELET
Basophils Absolute: 0 10*3/uL (ref 0.0–0.1)
Basophils Relative: 0.7 % (ref 0.0–3.0)
EOS PCT: 6 % — AB (ref 0.0–5.0)
Eosinophils Absolute: 0.4 10*3/uL (ref 0.0–0.7)
HEMATOCRIT: 40.3 % (ref 36.0–46.0)
Hemoglobin: 13.5 g/dL (ref 12.0–15.0)
LYMPHS ABS: 2 10*3/uL (ref 0.7–4.0)
Lymphocytes Relative: 33.6 % (ref 12.0–46.0)
MCHC: 33.4 g/dL (ref 30.0–36.0)
MCV: 88.1 fl (ref 78.0–100.0)
Monocytes Absolute: 0.4 10*3/uL (ref 0.1–1.0)
Monocytes Relative: 6.5 % (ref 3.0–12.0)
NEUTROS PCT: 53.2 % (ref 43.0–77.0)
Neutro Abs: 3.2 10*3/uL (ref 1.4–7.7)
Platelets: 200 10*3/uL (ref 150.0–400.0)
RBC: 4.58 Mil/uL (ref 3.87–5.11)
RDW: 13.1 % (ref 11.5–15.5)
WBC: 6.1 10*3/uL (ref 4.0–10.5)

## 2013-08-04 LAB — TSH: TSH: 3.1 u[IU]/mL (ref 0.35–4.50)

## 2013-08-04 LAB — VITAMIN D 25 HYDROXY (VIT D DEFICIENCY, FRACTURES): VITD: 35.12 ng/mL

## 2013-08-04 LAB — LIPID PANEL
CHOL/HDL RATIO: 2
Cholesterol: 180 mg/dL (ref 0–200)
HDL: 75.9 mg/dL (ref >39.00)
LDL CALC: 91 mg/dL (ref 0–99)
NonHDL: 104.1
TRIGLYCERIDES: 68 mg/dL (ref 0.0–149.0)
VLDL: 13.6 mg/dL (ref 0.0–40.0)

## 2013-08-04 NOTE — Assessment & Plan Note (Signed)
Pt's PE WNL.  UTD on GYN.  Check labs.  Anticipatory guidance provided.  

## 2013-08-04 NOTE — Patient Instructions (Signed)
Follow up in 1 year or as needed Keep up the good work!  You look great! We'll notify you of your lab results and make any changes if needed Call with any questions or concerns HAPPY BIRTHDAY!!! Welcome!  We're glad to have you!!

## 2013-08-04 NOTE — Progress Notes (Signed)
Pre visit review using our clinic review tool, if applicable. No additional management support is needed unless otherwise documented below in the visit note. 

## 2013-08-04 NOTE — Progress Notes (Signed)
   Subjective:    Patient ID: Bridget Mcbride, female    DOB: [birth year], 23 y.o.   MRN: [id number]  HPI New to establish.  Previous MD- none.  GYN- [facility].   Review of Systems Patient reports no vision/ hearing changes, adenopathy,fever, weight change,  persistant/recurrent hoarseness , swallowing issues, chest pain, palpitations, edema, persistant/recurrent cough, hemoptysis, dyspnea (rest/exertional/paroxysmal nocturnal), gastrointestinal bleeding (melena, rectal bleeding), abdominal pain, significant heartburn, bowel changes, GU symptoms (dysuria, hematuria, incontinence), Gyn symptoms (abnormal  bleeding, pain),  syncope, focal weakness, memory loss, numbness & tingling, skin/hair/nail changes, abnormal bruising or bleeding, anxiety, or depression.     Objective:   Physical Exam General Appearance:    Alert, cooperative, no distress, appears stated age  Head:    Normocephalic, without obvious abnormality, atraumatic  Eyes:    PERRL, conjunctiva/corneas clear, EOM's intact, fundi    benign, both eyes  Ears:    Normal TM's and external ear canals, both ears  Nose:   Nares normal, septum midline, mucosa normal, no drainage    or sinus tenderness  Throat:   Lips, mucosa, and tongue normal; teeth and gums normal  Neck:   Supple, symmetrical, trachea midline, no adenopathy;    Thyroid: no enlargement/tenderness/nodules  Back:     Symmetric, no curvature, ROM normal, no CVA tenderness  Lungs:     Clear to auscultation bilaterally, respirations unlabored  Chest Wall:    No tenderness or deformity   Heart:    Regular rate and rhythm, S1 and S2 normal, no murmur, rub   or gallop  Breast Exam:    Deferred to GYN  Abdomen:     Soft, non-tender, bowel sounds active all four quadrants,    no masses, no organomegaly  Genitalia:    Deferred to GYN  Rectal:    Extremities:   Extremities normal, atraumatic, no cyanosis or edema  Pulses:   2+ and symmetric all extremities  Skin:    Skin color, texture, turgor normal, no rashes or lesions  Lymph nodes:   Cervical, supraclavicular, and axillary nodes normal  Neurologic:   CNII-XII intact, normal strength, sensation and reflexes    throughout          Assessment & Plan:

## 2013-08-05 ENCOUNTER — Encounter: Payer: Self-pay | Admitting: General Practice

## 2013-10-01 ENCOUNTER — Ambulatory Visit: Payer: BC Managed Care – PPO | Admitting: Medical

## 2013-10-01 ENCOUNTER — Other Ambulatory Visit (HOSPITAL_COMMUNITY): Payer: Self-pay | Admitting: Obstetrics and Gynecology

## 2013-10-01 DIAGNOSIS — N971 Female infertility of tubal origin: Secondary | ICD-10-CM

## 2013-10-01 DIAGNOSIS — Z0289 Encounter for other administrative examinations: Secondary | ICD-10-CM

## 2013-10-08 ENCOUNTER — Ambulatory Visit (HOSPITAL_COMMUNITY)
Admission: RE | Admit: 2013-10-08 | Discharge: 2013-10-08 | Disposition: A | Discharge status: Home / Self Care | Payer: BC Managed Care – PPO | Source: Ambulatory Visit | Attending: Obstetrics and Gynecology | Admitting: Obstetrics and Gynecology

## 2013-10-08 DIAGNOSIS — E282 Polycystic ovarian syndrome: Secondary | ICD-10-CM | POA: Insufficient documentation

## 2013-10-08 DIAGNOSIS — N971 Female infertility of tubal origin: Secondary | ICD-10-CM

## 2013-10-08 MED ORDER — IOHEXOL 300 MG/ML  SOLN
20.0000 mL | Freq: Once | INTRAMUSCULAR | Status: AC | PRN
  Administered 2013-10-08: 20 mL

## 2014-05-09 LAB — HM PAP SMEAR: HM Pap smear: NORMAL

## 2014-05-12 ENCOUNTER — Encounter: Payer: Self-pay | Admitting: General Practice

## 2014-08-09 ENCOUNTER — Encounter: Payer: Self-pay | Admitting: Family Medicine

## 2014-09-07 ENCOUNTER — Telehealth: Payer: Self-pay | Admitting: Family Medicine

## 2014-09-07 NOTE — Telephone Encounter (Signed)
Pre visit letter mailed 09/03/14 °

## 2014-09-23 ENCOUNTER — Telehealth: Payer: Self-pay | Admitting: Behavioral Health

## 2014-09-23 ENCOUNTER — Encounter: Payer: Self-pay | Admitting: Behavioral Health

## 2014-09-23 NOTE — Telephone Encounter (Signed)
Pre-Visit Call completed with patient and chart updated.   Pre-Visit Info documented in Specialty Comments under [employer].    

## 2014-09-24 ENCOUNTER — Encounter: Payer: Self-pay | Admitting: Family Medicine

## 2014-09-24 ENCOUNTER — Ambulatory Visit (INDEPENDENT_AMBULATORY_CARE_PROVIDER_SITE_OTHER): Payer: 59 | Admitting: Family Medicine

## 2014-09-24 VITALS — BP 102/80 | HR 69 | Temp 98.1°F | Resp 16 | Ht 60.0 in | Wt 121.4 lb

## 2014-09-24 DIAGNOSIS — Z0000 Encounter for general adult medical examination without abnormal findings: Secondary | ICD-10-CM | POA: Diagnosis not present

## 2014-09-24 DIAGNOSIS — Z23 Encounter for immunization: Secondary | ICD-10-CM

## 2014-09-24 DIAGNOSIS — R41840 Attention and concentration deficit: Secondary | ICD-10-CM

## 2014-09-24 LAB — CBC WITH DIFFERENTIAL/PLATELET
BASOS ABS: 0 10*3/uL (ref 0.0–0.1)
Basophils Relative: 1 % (ref 0–1)
EOS PCT: 3 % (ref 0–5)
Eosinophils Absolute: 0.1 10*3/uL (ref 0.0–0.7)
HCT: 41.1 % (ref 36.0–46.0)
Hemoglobin: 14.1 g/dL (ref 12.0–15.0)
LYMPHS ABS: 1.8 10*3/uL (ref 0.7–4.0)
LYMPHS PCT: 39 % (ref 12–46)
MCH: 30 pg (ref 26.0–34.0)
MCHC: 34.3 g/dL (ref 30.0–36.0)
MCV: 87.4 fL (ref 78.0–100.0)
MONO ABS: 0.3 10*3/uL (ref 0.1–1.0)
MONOS PCT: 6 % (ref 3–12)
MPV: 9.5 fL (ref 8.6–12.4)
Neutro Abs: 2.3 10*3/uL (ref 1.7–7.7)
Neutrophils Relative %: 51 % (ref 43–77)
Platelets: 197 10*3/uL (ref 150–400)
RBC: 4.7 MIL/uL (ref 3.87–5.11)
RDW: 13.5 % (ref 11.5–15.5)
WBC: 4.6 10*3/uL (ref 4.0–10.5)

## 2014-09-24 LAB — LIPID PANEL
CHOLESTEROL: 172 mg/dL (ref 125–200)
HDL: 64 mg/dL (ref >=46)
LDL CALC: 92 mg/dL (ref <130)
TRIGLYCERIDES: 79 mg/dL (ref <150)
Total CHOL/HDL Ratio: 2.7 Ratio (ref <=5.0)
VLDL: 16 mg/dL (ref <30)

## 2014-09-24 LAB — HEPATIC FUNCTION PANEL
ALT: 11 U/L (ref 6–29)
AST: 16 U/L (ref 10–30)
Albumin: 4.2 g/dL (ref 3.6–5.1)
Alkaline Phosphatase: 56 U/L (ref 33–115)
BILIRUBIN INDIRECT: 0.4 mg/dL (ref 0.2–1.2)
BILIRUBIN TOTAL: 0.5 mg/dL (ref 0.2–1.2)
Bilirubin, Direct: 0.1 mg/dL (ref <=0.2)
Total Protein: 7.2 g/dL (ref 6.1–8.1)

## 2014-09-24 LAB — BASIC METABOLIC PANEL
BUN: 20 mg/dL (ref 7–25)
CO2: 27 mmol/L (ref 20–31)
CREATININE: 0.92 mg/dL (ref 0.50–1.10)
Calcium: 9.4 mg/dL (ref 8.6–10.2)
Chloride: 101 mmol/L (ref 98–110)
GLUCOSE: 83 mg/dL (ref 65–99)
POTASSIUM: 4.5 mmol/L (ref 3.5–5.3)
SODIUM: 136 mmol/L (ref 135–146)

## 2014-09-24 NOTE — Patient Instructions (Signed)
Follow up 2 weeks after your testing to determine what the next steps are in the memory issue We'll notify you of your lab results and make any changes if needed Keep up the good work on healthy diet and regular exercise- you look great! Call and schedule evaluation for ADD at [facility] [phone] Call with any questions or concerns Enjoy the rest of your summer! Have a great weekend!

## 2014-09-24 NOTE — Progress Notes (Signed)
Pre visit review using our clinic review tool, if applicable. No additional management support is needed unless otherwise documented below in the visit note. 

## 2014-09-24 NOTE — Progress Notes (Signed)
   Subjective:    Patient ID: Bridget Mcbride, female    DOB: 1990-06-19, 24 y.o.   MRN: [id number]  HPI CPE- UTD on pap w/ Henreitta Leber (GYN).  Pt's concern today is her memory- 'it's getting really bad'.  Pt is unable to remember names during conversations, tasks she needs to complete.  Boyfriend is very frustrated b/c he feels she is not paying attention to him.  Pt had difficulty completing work in college but not HS.  Pt has difficulty w/ directions.  Pt reports she is having difficulty at both work and home.   Review of Systems Patient reports no vision/ hearing changes, adenopathy,fever, weight change,  persistant/recurrent hoarseness , swallowing issues, chest pain, palpitations, edema, persistant/recurrent cough, hemoptysis, dyspnea (rest/exertional/paroxysmal nocturnal), gastrointestinal bleeding (melena, rectal bleeding), abdominal pain, significant heartburn, bowel changes, GU symptoms (dysuria, hematuria, incontinence), Gyn symptoms (abnormal  bleeding, pain),  syncope, focal weakness, numbness & tingling, skin/hair/nail changes, abnormal bruising or bleeding, anxiety, or depression.     Objective:   Physical Exam General Appearance:    Alert, cooperative, no distress, appears stated age  Head:    Normocephalic, without obvious abnormality, atraumatic  Eyes:    PERRL, conjunctiva/corneas clear, EOM's intact, fundi    benign, both eyes  Ears:    Normal TM's and external ear canals, both ears  Nose:   Nares normal, septum midline, mucosa normal, no drainage    or sinus tenderness  Throat:   Lips, mucosa, and tongue normal; teeth and gums normal  Neck:   Supple, symmetrical, trachea midline, no adenopathy;    Thyroid: no enlargement/tenderness/nodules  Back:     Symmetric, no curvature, ROM normal, no CVA tenderness  Lungs:     Clear to auscultation bilaterally, respirations unlabored  Chest Wall:    No tenderness or deformity   Heart:    Regular rate and rhythm, S1 and S2  normal, no murmur, rub   or gallop  Breast Exam:    Deferred to GYN  Abdomen:     Soft, non-tender, bowel sounds active all four quadrants,    no masses, no organomegaly  Genitalia:    Deferred to GYN  Rectal:    Extremities:   Extremities normal, atraumatic, no cyanosis or edema  Pulses:   2+ and symmetric all extremities  Skin:   Skin color, texture, turgor normal, no rashes or lesions  Lymph nodes:   Cervical, supraclavicular, and axillary nodes normal  Neurologic:   CNII-XII intact, normal strength, sensation and reflexes    throughout          Assessment & Plan:

## 2014-09-25 LAB — VITAMIN D 25 HYDROXY (VIT D DEFICIENCY, FRACTURES): Vit D, 25-Hydroxy: 31 ng/mL (ref 30–100)

## 2014-09-25 LAB — TSH: TSH: 1.218 u[IU]/mL (ref 0.350–4.500)

## 2014-09-26 DIAGNOSIS — R41840 Attention and concentration deficit: Secondary | ICD-10-CM | POA: Insufficient documentation

## 2014-09-26 NOTE — Assessment & Plan Note (Signed)
Pt's PE WNL.  UTD on GYN.  Check labs.  Anticipatory guidance provided.  

## 2014-09-26 NOTE — Assessment & Plan Note (Signed)
New.  Pt's sxs are more consistent w/ inattention than memory loss.  Encouraged pt to seek out psychoeducational assessment prior to neurology referral.  If psychoed evaluation does not provide insight, will proceed w/ neuro referral.  Pt expressed understanding and is in agreement w/ plan.

## 2014-09-27 ENCOUNTER — Encounter: Payer: Self-pay | Admitting: General Practice

## 2015-03-23 ENCOUNTER — Encounter: Payer: Self-pay | Admitting: Family Medicine

## 2015-03-23 ENCOUNTER — Ambulatory Visit (HOSPITAL_BASED_OUTPATIENT_CLINIC_OR_DEPARTMENT_OTHER): Admission: RE | Admit: 2015-03-23 | Payer: 59 | Source: Ambulatory Visit

## 2015-03-23 ENCOUNTER — Ambulatory Visit (INDEPENDENT_AMBULATORY_CARE_PROVIDER_SITE_OTHER): Payer: 59 | Admitting: Family Medicine

## 2015-03-23 VITALS — BP 104/70 | HR 75 | Temp 98.0°F | Ht 64.5 in | Wt 126.2 lb

## 2015-03-23 VITALS — BP 105/69 | HR 69 | Ht 64.0 in | Wt 126.0 lb

## 2015-03-23 DIAGNOSIS — M25561 Pain in right knee: Secondary | ICD-10-CM | POA: Insufficient documentation

## 2015-03-23 DIAGNOSIS — M79604 Pain in right leg: Secondary | ICD-10-CM | POA: Diagnosis not present

## 2015-03-23 MED ORDER — TRAMADOL HCL 50 MG PO TABS: 50.0000 mg | ORAL_TABLET | Freq: Three times a day (TID) | ORAL | Status: DC | PRN

## 2015-03-23 MED ORDER — MELOXICAM 15 MG PO TABS: 15.0000 mg | ORAL_TABLET | Freq: Every day | ORAL | Status: DC

## 2015-03-23 MED ORDER — HYDROCODONE-ACETAMINOPHEN 5-325 MG PO TABS
1.0000 | ORAL_TABLET | Freq: Four times a day (QID) | ORAL | Status: DC | PRN
Start: 2015-03-23 — End: 2015-07-04

## 2015-03-23 NOTE — Assessment & Plan Note (Signed)
New.  Pt w/ exquisite pain and some swelling posterior to R knee.  Unable to flex knee w/o considerable pain.  No known injury.  Pt may have Baker's cyst or other soft tissue cause for pain as she has no bony tenderness on exam.  Due to her severely limited range of motion, will have her see Sports Med later today for complete evaluation and tx.  Start daily Mobic for inflammation and Tramadol for pain.  Pt expressed understanding and is in agreement w/ plan.

## 2015-03-23 NOTE — Patient Instructions (Signed)
Get the ultrasound of your leg today to assess for a blood clot. If this is negative take aleve 2 tabs twice a day with food for pain and inflammation. Heat 15 minutes at a time 3-4 times a day for spasms. Norco every 6 hours as needed for severe pain (no driving on this). ACE wrap when you can tolerate this. We will consider physical therapy depending on the test results also.

## 2015-03-23 NOTE — Patient Instructions (Signed)
You have an appt today at 3:15 with Dr Pearletha Forge upstairs on the 3rd floor Start the Meloxicam once daily for inflammation- take w/ food Use the Tramadol as needed for pain- this is a pain medication and may cause drowsiness Don't take any additional Ibuprofen or Aleve while on the Mobic but tylenol is ok ICE! Call with any questions or concerns Hang in there!!

## 2015-03-23 NOTE — Progress Notes (Signed)
Pre visit review using our clinic review tool, if applicable. No additional management support is needed unless otherwise documented below in the visit note. 

## 2015-03-23 NOTE — Progress Notes (Signed)
   Subjective:    Patient ID: Bridget Mcbride, female    DOB: Feb 25, 1990, 25 y.o.   MRN: [id number]  HPI Knee pain- R knee pain.  Pt reports pain started Monday, yesterday pain worsened.  Woke from a nap and then had pulling pain posterior to knee.  No swelling, redness, warmth.  No recent injury.   Review of Systems For ROS see HPI     Objective:   Physical Exam  Constitutional: She is oriented to person, place, and time. She appears well-developed and well-nourished. No distress.  HENT:  Head: Normocephalic and atraumatic.  Cardiovascular: Intact distal pulses.   Musculoskeletal: She exhibits edema (mild swelling behind R knee) and tenderness (very TTP posterior to R knee).  Pain w/ flexion of R knee- pt prefers to keep leg in full extension No TTP along medial or lateral joint line  Neurological: She is alert and oriented to person, place, and time.  Skin: Skin is warm and dry. No erythema.  Psychiatric: She has a normal mood and affect. Her behavior is normal. Thought content normal.  Vitals reviewed.         Assessment & Plan:

## 2015-03-28 NOTE — Progress Notes (Signed)
PCP and consultation requested by: Neena Rhymes, MD  Subjective:   HPI: Patient is a 25 y.o. female here for right leg pain.  Patient reports since 1/31 she has developed severe 10/10 posterior right knee/leg pain. Pain is sharp. Difficult to walk due to pain. Couldn't sleep due to this. No injuries. No long trips. Does take birth control pills. No skin changes, fever, redness, other complaints.  Past Medical History  Diagnosis Date  . Chlamydia   . Anemia   . BV (bacterial vaginosis)   . GERD (gastroesophageal reflux disease)     Current Outpatient Prescriptions on File Prior to Visit  Medication Sig Dispense Refill  . meloxicam (MOBIC) 15 MG tablet Take 1 tablet (15 mg total) by mouth daily. 30 tablet 1  . traMADol (ULTRAM) 50 MG tablet Take 1 tablet (50 mg total) by mouth every 8 (eight) hours as needed. 30 tablet 0   No current facility-administered medications on file prior to visit.    No past surgical history on file.  No Known Allergies  Social History   Social History  . Marital Status: Single    Spouse Name: N/A  . Number of Children: N/A  . Years of Education: N/A   Occupational History  . Not on file.   Social History Main Topics  . Smoking status: Never Smoker   . Smokeless tobacco: Never Used  . Alcohol Use: 0.0 oz/week    0 Standard drinks or equivalent per week     Comment: occasional  . Drug Use: No  . Sexual Activity: Not on file   Other Topics Concern  . Not on file   Social History Narrative    Family History  Problem Relation Age of Onset  . Hypertension Mother   . Migraines Father   . Hypertension Maternal Aunt   . Hypertension Maternal Uncle   . Stroke Maternal Uncle   . Diabetes Paternal Aunt   . Arthritis Paternal Grandmother   . Hypertension Maternal Aunt   . Hypertension Maternal Aunt   . Hypertension Maternal Aunt   . Stroke Cousin     BP 105/69 mmHg  Pulse 69  Ht  (1.626 m)  Wt 126 lb (57.153 kg)   BMI 21.62 kg/m2  LMP 03/17/2015  Review of Systems: See HPI above.    Objective:  Physical Exam:  Gen: NAD  Right leg/knee: No gross deformity, ecchymoses, swelling.  Cal measures 0.5cm larger in circumference compared to left. TTP popliteal fossa, midportion distal posterior thigh and proximal calf. FROM knee. Negative ant/post drawers. Negative valgus/varus testing. Negative lachmanns. Negative mcmurrays, apleys, patellar apprehension. NV intact distally.  MSK u/s:  No evidence bakers cyst, effusion, other abnormalities.    Assessment & Plan:  1. Right leg pain - concerning for DVT.  I ordered a doppler u/s of her right lower extremity which was ordered STAT.  Patient called and canceled this despite our discussion on the importance of this test, risk for PE, death if she is has a DVT.  We had discussed if this was normal to try heat, norco, nsaids, and ACE wrap.

## 2015-03-28 NOTE — Assessment & Plan Note (Signed)
concerning for DVT.  I ordered a doppler u/s of her right lower extremity which was ordered STAT.  Patient called and canceled this despite our discussion on the importance of this test, risk for PE, death if she is has a DVT.  We had discussed if this was normal to try heat, norco, nsaids, and ACE wrap.

## 2015-06-06 ENCOUNTER — Encounter (HOSPITAL_COMMUNITY): Payer: Self-pay | Admitting: Emergency Medicine

## 2015-06-06 ENCOUNTER — Ambulatory Visit (HOSPITAL_COMMUNITY)
Admission: EM | Admit: 2015-06-06 | Discharge: 2015-06-06 | Disposition: A | Discharge status: Home / Self Care | Payer: 59 | Attending: Family Medicine | Admitting: Family Medicine

## 2015-06-06 DIAGNOSIS — M545 Low back pain, unspecified: Secondary | ICD-10-CM

## 2015-06-06 DIAGNOSIS — T148 Other injury of unspecified body region: Secondary | ICD-10-CM

## 2015-06-06 DIAGNOSIS — T148XXA Other injury of unspecified body region, initial encounter: Secondary | ICD-10-CM
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
## 2015-07-02 ENCOUNTER — Ambulatory Visit (INDEPENDENT_AMBULATORY_CARE_PROVIDER_SITE_OTHER): Payer: 59 | Admitting: Internal Medicine

## 2015-07-02 ENCOUNTER — Ambulatory Visit (HOSPITAL_BASED_OUTPATIENT_CLINIC_OR_DEPARTMENT_OTHER)
Admission: RE | Admit: 2015-07-02 | Discharge: 2015-07-02 | Disposition: A | Discharge status: Home / Self Care | Payer: 59 | Source: Ambulatory Visit | Attending: Internal Medicine | Admitting: Internal Medicine

## 2015-07-02 ENCOUNTER — Ambulatory Visit (INDEPENDENT_AMBULATORY_CARE_PROVIDER_SITE_OTHER): Payer: 59

## 2015-07-02 ENCOUNTER — Emergency Department (HOSPITAL_COMMUNITY)
Admission: EM | Admit: 2015-07-02 | Discharge: 2015-07-02 | Disposition: A | Discharge status: Home / Self Care | Payer: 59 | Attending: Emergency Medicine | Admitting: Emergency Medicine

## 2015-07-02 ENCOUNTER — Encounter (HOSPITAL_COMMUNITY): Payer: Self-pay | Admitting: Emergency Medicine

## 2015-07-02 ENCOUNTER — Other Ambulatory Visit: Payer: Self-pay

## 2015-07-02 ENCOUNTER — Emergency Department (HOSPITAL_COMMUNITY)
Admission: EM | Admit: 2015-07-02 | Discharge: 2015-07-02 | Disposition: A | Discharge status: Other Acute Inpatient Hospital | Payer: 59

## 2015-07-02 VITALS — BP 102/60 | HR 108 | Temp 99.7°F | Resp 17 | Ht 64.0 in | Wt 120.0 lb

## 2015-07-02 DIAGNOSIS — M7989 Other specified soft tissue disorders: Secondary | ICD-10-CM

## 2015-07-02 DIAGNOSIS — M25552 Pain in left hip: Secondary | ICD-10-CM

## 2015-07-02 DIAGNOSIS — M79662 Pain in left lower leg: Secondary | ICD-10-CM | POA: Diagnosis not present

## 2015-07-02 DIAGNOSIS — M79605 Pain in left leg: Secondary | ICD-10-CM | POA: Diagnosis present

## 2015-07-02 DIAGNOSIS — Z793 Long term (current) use of hormonal contraceptives: Secondary | ICD-10-CM | POA: Insufficient documentation

## 2015-07-02 DIAGNOSIS — Z8742 Personal history of other diseases of the female genital tract: Secondary | ICD-10-CM | POA: Diagnosis not present

## 2015-07-02 DIAGNOSIS — I82402 Acute embolism and thrombosis of unspecified deep veins of left lower extremity: Secondary | ICD-10-CM | POA: Diagnosis not present

## 2015-07-02 DIAGNOSIS — Z862 Personal history of diseases of the blood and blood-forming organs and certain disorders involving the immune mechanism: Secondary | ICD-10-CM | POA: Insufficient documentation

## 2015-07-02 DIAGNOSIS — R509 Fever, unspecified: Secondary | ICD-10-CM | POA: Diagnosis not present

## 2015-07-02 DIAGNOSIS — Z8619 Personal history of other infectious and parasitic diseases: Secondary | ICD-10-CM | POA: Diagnosis not present

## 2015-07-02 LAB — POCT CBC
Granulocyte percent: 86.1 %G — AB (ref 37–80)
HEMATOCRIT: 36.4 % — AB (ref 37.7–47.9)
HEMOGLOBIN: 12.9 g/dL (ref 12.2–16.2)
LYMPH, POC: 1.1 (ref 0.6–3.4)
MCH, POC: 29.8 pg (ref 27–31.2)
MCHC: 35.6 g/dL — AB (ref 31.8–35.4)
MCV: 83.7 fL (ref 80–97)
MID (cbc): 0.4 (ref 0–0.9)
MPV: 7.1 fL (ref 0–99.8)
POC GRANULOCYTE: 9 — AB (ref 2–6.9)
POC LYMPH PERCENT: 10.5 %L (ref 10–50)
POC MID %: 3.4 %M (ref 0–12)
Platelet Count, POC: 249 10*3/uL (ref 142–424)
RBC: 4.34 M/uL (ref 4.04–5.48)
RDW, POC: 11.9 %
WBC: 10.4 10*3/uL — AB (ref 4.6–10.2)

## 2015-07-02 LAB — CBC WITH DIFFERENTIAL/PLATELET
Basophils Absolute: 0 10*3/uL (ref 0.0–0.1)
Basophils Relative: 0 %
EOS PCT: 1 %
Eosinophils Absolute: 0.1 10*3/uL (ref 0.0–0.7)
HEMATOCRIT: 36.3 % (ref 36.0–46.0)
Hemoglobin: 12.1 g/dL (ref 12.0–15.0)
LYMPHS ABS: 1.3 10*3/uL (ref 0.7–4.0)
Lymphocytes Relative: 12 %
MCH: 28.6 pg (ref 26.0–34.0)
MCHC: 33.3 g/dL (ref 30.0–36.0)
MCV: 85.8 fL (ref 78.0–100.0)
MONO ABS: 0.4 10*3/uL (ref 0.1–1.0)
MONOS PCT: 4 %
NEUTROS ABS: 8.8 10*3/uL — AB (ref 1.7–7.7)
Neutrophils Relative %: 83 %
PLATELETS: 216 10*3/uL (ref 150–400)
RBC: 4.23 MIL/uL (ref 3.87–5.11)
RDW: 12 % (ref 11.5–15.5)
WBC: 10.6 10*3/uL — AB (ref 4.0–10.5)

## 2015-07-02 LAB — BRAIN NATRIURETIC PEPTIDE: B Natriuretic Peptide: 12.3 pg/mL (ref 0.0–100.0)

## 2015-07-02 LAB — BASIC METABOLIC PANEL
Anion gap: 10 (ref 5–15)
BUN: 14 mg/dL (ref 6–20)
CALCIUM: 9.2 mg/dL (ref 8.9–10.3)
CO2: 24 mmol/L (ref 22–32)
CREATININE: 0.78 mg/dL (ref 0.44–1.00)
Chloride: 103 mmol/L (ref 101–111)
GFR calc Af Amer: >60 mL/min (ref >60)
GLUCOSE: 89 mg/dL (ref 65–99)
Potassium: 4.1 mmol/L (ref 3.5–5.1)
Sodium: 137 mmol/L (ref 135–145)

## 2015-07-02 LAB — CK: Total CK: 82 U/L (ref 7–177)

## 2015-07-02 LAB — COMPREHENSIVE METABOLIC PANEL
ALBUMIN: 4.2 g/dL (ref 3.6–5.1)
ALT: 7 U/L (ref 6–29)
AST: 13 U/L (ref 10–30)
Alkaline Phosphatase: 67 U/L (ref 33–115)
BUN: 16 mg/dL (ref 7–25)
CALCIUM: 9.3 mg/dL (ref 8.6–10.2)
CHLORIDE: 103 mmol/L (ref 98–110)
CO2: 26 mmol/L (ref 20–31)
CREATININE: 0.81 mg/dL (ref 0.50–1.10)
Glucose, Bld: 108 mg/dL — ABNORMAL HIGH (ref 65–99)
POTASSIUM: 4.5 mmol/L (ref 3.5–5.3)
SODIUM: 137 mmol/L (ref 135–146)
Total Bilirubin: 0.5 mg/dL (ref 0.2–1.2)
Total Protein: 7.9 g/dL (ref 6.1–8.1)

## 2015-07-02 LAB — I-STAT TROPONIN, ED: Troponin i, poc: 0 ng/mL (ref 0.00–0.08)

## 2015-07-02 LAB — POCT SEDIMENTATION RATE: POCT SED RATE: 34 mm/h — AB (ref 0–22)

## 2015-07-02 MED ORDER — KETOROLAC TROMETHAMINE 60 MG/2ML IM SOLN
60.0000 mg | Freq: Once | INTRAMUSCULAR | Status: AC
  Administered 2015-07-02: 60 mg via INTRAMUSCULAR

## 2015-07-02 MED ORDER — IBUPROFEN 800 MG PO TABS
800.0000 mg | ORAL_TABLET | Freq: Once | ORAL | Status: AC
  Administered 2015-07-02: 800 mg via ORAL
  Filled 2015-07-02: qty 1

## 2015-07-02 MED ORDER — RIVAROXABAN (XARELTO) VTE STARTER PACK (15 & 20 MG)
ORAL_TABLET | ORAL | Status: DC
Start: 2015-07-02 — End: 2015-10-12

## 2015-07-02 MED ORDER — RIVAROXABAN 15 MG PO TABS
15.0000 mg | ORAL_TABLET | Freq: Once | ORAL | Status: AC
  Administered 2015-07-02: 15 mg via ORAL
  Filled 2015-07-02: qty 1

## 2015-07-02 NOTE — ED Notes (Signed)
Pt here for multiple DVT in left lower leg. [facility] and sent here for outpatient DVT study, which was positive. Pt has pain and swelling to left lower leg. Pt denies CP, SOB.

## 2015-07-02 NOTE — Progress Notes (Signed)
VASCULAR LAB PRELIMINARY  PRELIMINARY  PRELIMINARY  PRELIMINARY  Bilateral lower extremity venous duplex completed.    Preliminary report:  There is acute, occlusive DVT noted in the left lower extremity, including the posterior tibial, peroneal, soleal, gastrocnemius, popliteal, femoral, common femoral veins, profunda, and at the saphenofemoral junction.  There is no propagation to the right lower extremity.   Birdie Beveridge, RVT 07/02/2015, 2:57 PM

## 2015-07-02 NOTE — Discharge Instructions (Signed)
Take the prescribed medication as directed-- start tomorrow, you have already had today's dose. Use caution when taking this medication, UA at higher risk for bleeding. Please monitor for any blood in the stool, urine, bleeding from mouth/gums, etc.   Avoid taking NSAIDs with this as it will increase your risk of bleeding further.  Recommend tylenol instead. Follow-up with your primary care physician as soon as possible. Return to the ED for new or worsening symptoms-- chest pain, shortness of breath, excessive bleeding, etc.

## 2015-07-02 NOTE — Progress Notes (Signed)
By signing my name below I, [name], attest that this documentation has been prepared under the direction and in the presence of Ellamae Siaobert Dorlis Judice, MD. Electonically Signed. [name], Scribe 07/02/2015 at 1:42 PM   Subjective:    Patient ID: Bridget CaneJasmine N Bott, female    DOB: 02/26/1990, 25 y.o.   MRN: [id number]  Chief Complaint  Patient presents with  . Leg Pain    left side     HPI Bridget CaneJasmine N Mcbride is a 25 y.o. female who presents to the [facility] complaining of left leg pain and swelling that has been constant since yesterday. Swelling is in left thigh and calf. Pain is from hip down to her ankle. Pt also reports pain radiating throughout her left hip with urination and BM. Pt denies fever, or redness in her skin. Pain started in her hip 4 days after working out for the first time in 3 months at the gym. Pt cannot walk due to pain in left leg.   Pt states she had similar leg pain in her rt knee 6 months ago. Pain was only in lower leg and knee. Pain at that time occurred 3 days after working out at the gym.   Pt denies SOB, cough, sore throat, runny nose, joint swelling, vaginal discharge, dysuria, blood in her stool, black tarry stool, or hematuria.   Pt on hormonal birth control for the past 7 years.   Pt reports low back pain and stated 3 days ago pt went to the chiropractor and her back pain was resolved.  Pt's period started on time yesterday. Pt finished treating her BV a week ago.     Patient Active Problem List   Diagnosis Date Noted  . Posterior right knee pain 03/23/2015  . Inattention 09/26/2014  . Routine general medical examination at a health care facility 08/04/2013  . BV (bacterial vaginosis) 03/03/2012    Current outpatient prescriptions:  .  drospirenone-ethinyl estradiol (YASMIN,ZARAH,SYEDA) 3-0.03 MG tablet, TAKE 1 TABLET(S) EVERY DAY BY ORAL ROUTE., Disp: , Rfl: 0 .  HYDROcodone-acetaminophen (NORCO) 5-325 MG  tablet, Take 1 tablet by mouth every 6 (six) hours as needed for moderate pain. (Patient not taking: Reported on 07/02/2015), Disp: 40 tablet, Rfl: 0 .  meloxicam (MOBIC) 15 MG tablet, Take 1 tablet (15 mg total) by mouth daily. (Patient not taking: Reported on 07/02/2015), Disp: 30 tablet, Rfl: 1 .  traMADol (ULTRAM) 50 MG tablet, Take 1 tablet (50 mg total) by mouth every 8 (eight) hours as needed. (Patient not taking: Reported on 07/02/2015), Disp: 30 tablet, Rfl: 0  No Known Allergies     Review of Systems  Constitutional: Negative for chills.  HENT: Negative for congestion.   Respiratory: Negative for cough.   Genitourinary: Negative for dysuria.  Musculoskeletal:       Left leg pain and swelling       Objective:   Physical Exam  Constitutional: She is oriented to person, place, and time. She appears well-developed and well-nourished. No distress.  HENT:  Head: Normocephalic and atraumatic.  Eyes: Conjunctivae are normal. Pupils are equal, round, and reactive to light.  Neck: Neck supple.  Pulmonary/Chest: Effort normal.  Musculoskeletal: Normal range of motion.  Left leg swollen from hip to ankle without pitting edema. There is no redness no petechiae, distal pulses intact. Left leg is tender to palpation everywhere. There is pain with any ROM of the left leg and any weight bearing. The left knee has no  effusion, redness, tenderness except in the popliteal region.  Neurological: She is alert and oriented to person, place, and time.  Skin: Skin is warm and dry. No ecchymosis and no rash noted. No erythema.  Psychiatric: She has a normal mood and affect. Her behavior is normal.  Nursing note and vitals reviewed.  Pt's legs were measured with measuring tape: 10 cm below knee cap: Pt's left calf was 37cm while her right calf was 33cm. 10cm above knee cap: Pt's left thigh was 48cm while her right thigh was 45cm.  Filed Vitals:   07/02/15 1116  BP: 102/60  Pulse: 108  Temp:  99.7 F (37.6 C)  TempSrc: Oral  Resp: 17  Height:  (1.626 m)  Weight: 120 lb (54.432 kg)  SpO2: 97%   Results for orders placed or performed in visit on 07/02/15  POCT CBC  Result Value Ref Range   WBC 10.4 (A) 4.6 - 10.2 K/uL   Lymph, poc 1.1 0.6 - 3.4   POC LYMPH PERCENT 10.5 10 - 50 %L   MID (cbc) 0.4 0 - 0.9   POC MID % 3.4 0 - 12 %M   POC Granulocyte 9.0 (A) 2 - 6.9   Granulocyte percent 86.1 (A) 37 - 80 %G   RBC 4.34 4.04 - 5.48 M/uL   Hemoglobin 12.9 12.2 - 16.2 g/dL   HCT, POC 40.9 (A) 81.1 - 47.9 %   MCV 83.7 80 - 97 fL   MCH, POC 29.8 27 - 31.2 pg   MCHC 35.6 (A) 31.8 - 35.4 g/dL   RDW, POC 91.4 %   Platelet Count, POC 249 142 - 424 K/uL   MPV 7.1 0 - 99.8 fL   Dg Hip Unilat W Or W/o Pelvis 2-3 Views Left  07/02/2015  CLINICAL DATA:  Left hip pain. EXAM: DG HIP (WITH OR WITHOUT PELVIS) 2-3V LEFT COMPARISON:  [facility] 09/28/2012 FINDINGS: There is no evidence of hip fracture or dislocation. There is no evidence of arthropathy or other focal bone abnormality. IMPRESSION: Negative. Electronically Signed   By: Elberta Fortis M.D.   On: 07/02/2015 13:39         Assessment & Plan:  Pain and swelling of lower leg, left - Plan: POCT SEDIMENTATION RATE, CK, ANA, ketorolac (TORADOL) injection 60 mg, LE VENOUS  Fever, unspecified - Plan: POCT CBC, Comprehensive metabolic panel  Pain, hip, left - Plan: DG HIP UNILAT W OR W/O PELVIS 2-3 VIEWS LEFT, LE VENOUS  Suspect acute DVT to iliac area Send emerg [facility] for [facility]  I have completed the patient encounter in its entirety as documented by the scribe, with editing by me where necessary. Paras Kreider P. Merla Riches, M.D.  Addend-doppler:positive clots thru LE to groin Will send to ER for further dx and admit for stabiliz Will need hypercoag profile ?extens to inf VC/need for basket

## 2015-07-02 NOTE — Patient Instructions (Addendum)
[facility].  Let them know that you are there as an outpatient for a doppler    IF you received an x-ray today, you will receive an invoice from [facility]. Please contact [facility] at [phone] with questions or concerns regarding your invoice.   IF you received labwork today, you will receive an invoice from [facility]. Please contact [facility] at [phone] with questions or concerns regarding your invoice.   Our billing staff will not be able to assist you with questions regarding bills from these companies.  You will be contacted with the lab results as soon as they are available. The fastest way to get your results is to activate your My Chart account. Instructions are located on the last page of this paperwork. If you have not heard from us regarding the results in 2 weeks, please contact this office.

## 2015-07-02 NOTE — ED Provider Notes (Signed)
CSN: [id number]     Arrival date & time 07/02/15  1512 History   First MD Initiated Contact with Patient 07/02/15 1533     Chief Complaint  Patient presents with  . DVT     (Consider location/radiation/quality/duration/timing/severity/associated sxs/prior Treatment) The history is provided by the patient and medical records.    25 year old female with history of anemia, GERD, presenting to the ED for DVT in left leg.  Patient reports she began having left leg pain yesterday. She states pain is throughout her entire leg, but worse in her left calf. She reports some mild swelling of her left leg and foot as well. States pain is worse with ambulation or exertional activity. She states similar symptoms in her right leg about 3 months ago but never had this evaluated. She was seen at urgent care earlier today and sent for an outpatient vascular study which did reveal acute DVT throughout the left leg. She denies any chest pain or shortness of breath. No fever or chills. No history of DVT or PE in the past. Patient is currently on OCPs. She states she has been on these for several years.  States her physician recently did change her pills because of changes in her insurance coverage.  Patient denies possibility of pregnancy, started her menstrual cycle yesterday.  VSS.  Past Medical History  Diagnosis Date  . Chlamydia   . Anemia   . BV (bacterial vaginosis)   . GERD (gastroesophageal reflux disease)    History reviewed. No pertinent past surgical history. Family History  Problem Relation Age of Onset  . Hypertension Mother   . Migraines Father   . Hypertension Maternal Aunt   . Hypertension Maternal Uncle   . Stroke Maternal Uncle   . Diabetes Paternal Aunt   . Arthritis Paternal Grandmother   . Hypertension Maternal Aunt   . Hypertension Maternal Aunt   . Hypertension Maternal Aunt   . Stroke Cousin    Social History  Substance Use Topics  . Smoking status: Never Smoker   .  Smokeless tobacco: Never Used  . Alcohol Use: 0.0 oz/week    0 Standard drinks or equivalent per week     Comment: occasional   OB History    Gravida Para Term Preterm AB TAB SAB Ectopic Multiple Living   0              Review of Systems  Musculoskeletal: Positive for myalgias and arthralgias.  All other systems reviewed and are negative.     Allergies  Review of patient's allergies indicates no known allergies.  Home Medications   Prior to Admission medications   Medication Sig Start Date End Date Taking? Authorizing Provider  drospirenone-ethinyl estradiol (YASMIN,ZARAH,SYEDA) 3-0.03 MG tablet TAKE 1 TABLET(S) EVERY DAY BY ORAL ROUTE. 02/17/15   Historical Provider, MD  HYDROcodone-acetaminophen (NORCO) 5-325 MG tablet Take 1 tablet by mouth every 6 (six) hours as needed for moderate pain. Patient not taking: Reported on 07/02/2015 03/23/15   Lenda KelpShane R Hudnall, MD  meloxicam (MOBIC) 15 MG tablet Take 1 tablet (15 mg total) by mouth daily. Patient not taking: Reported on 07/02/2015 03/23/15   Sheliah HatchKatherine E Tabori, MD  traMADol (ULTRAM) 50 MG tablet Take 1 tablet (50 mg total) by mouth every 8 (eight) hours as needed. Patient not taking: Reported on 07/02/2015 03/23/15   Sheliah HatchKatherine E Tabori, MD  valACYclovir (VALTREX) 500 MG tablet TAKE 1 TABLET BY MOUTH TWICE A DAY FOR 3-5 DAYS 05/08/15   Historical  Provider, MD   BP 118/66 mmHg  Pulse 95  Temp(Src) 98.7 F (37.1 C) (Oral)  Resp 20  Wt 54.432 kg  SpO2 98%  LMP 07/01/2015 (Exact Date)   Physical Exam  Constitutional: She is oriented to person, place, and time. She appears well-developed and well-nourished.  HENT:  Head: Normocephalic and atraumatic.  Mouth/Throat: Oropharynx is clear and moist.  Eyes: Conjunctivae and EOM are normal. Pupils are equal, round, and reactive to light.  Neck: Normal range of motion.  Cardiovascular: Normal rate, regular rhythm and normal heart sounds.   Pulmonary/Chest: Effort normal and breath sounds  normal.  Abdominal: Soft. Bowel sounds are normal.  Musculoskeletal: Normal range of motion.  Slight asymmetry of left leg compared with right, tenderness throughout left thigh and calf; there is no overlying skin changes or significant warmth to touch, DP pulses intact; ambulatory with steady gait  Neurological: She is alert and oriented to person, place, and time.  Skin: Skin is warm and dry.  Psychiatric: She has a normal mood and affect.  Nursing note and vitals reviewed.   ED Course  Procedures (including critical care time) Labs Review Labs Reviewed  CBC WITH DIFFERENTIAL/PLATELET - Abnormal; Notable for the following:    WBC 10.6 (*)    Neutro Abs 8.8 (*)    All other components within normal limits  BASIC METABOLIC PANEL  BRAIN NATRIURETIC PEPTIDE  I-STAT TROPOININ, ED    Imaging Review  VASCULAR LAB PRELIMINARY PRELIMINARY PRELIMINARY PRELIMINARY  Bilateral lower extremity venous duplex completed.   Preliminary report: There is acute, occlusive DVT noted in the left lower extremity, including the posterior tibial, peroneal, soleal, gastrocnemius, popliteal, femoral, common femoral veins, profunda, and at the saphenofemoral junction. There is no propagation to the right lower extremity.   KANADY, CANDACE, RVT 07/02/2015, 2:57 PM  Dg Hip Unilat W Or W/o Pelvis 2-3 Views Left  07/02/2015  CLINICAL DATA:  Left hip pain. EXAM: DG HIP (WITH OR WITHOUT PELVIS) 2-3V LEFT COMPARISON:  KUB 09/28/2012 FINDINGS: There is no evidence of hip fracture or dislocation. There is no evidence of arthropathy or other focal bone abnormality. IMPRESSION: Negative. Electronically Signed   By: Elberta Fortis M.D.   On: 07/02/2015 13:39   I have personally reviewed and evaluated these images and lab results as part of my medical decision-making.   EKG Interpretation None      MDM   Final diagnoses:  DVT of leg (deep venous thrombosis), left   25 y.o. F here for DVT of left leg.   This was discovered today at an OP vascular study.  Findings as above.  Patient does have some mild swelling and tenderness of left leg when compared with right.  She is currently on OCP's which i suspect is the culprit for her DVT.  She denies chest pain or SOB.  Lab work reassuring without any evidence of heart strain.  EKG NSR, no acute ischemia.  VS remain stable.  Will start on xarelto, first dose given here.  Discussed bleeding precautions for home and recommended to hold on NSAIDs while taking this.  Monitor for chest pain/SOB that could indicate acute PE.  Follow-up with PCP.  She will likely need to come off her OCP's as well-- patient seems unhappy about this but will discuss with PCP.  Discussed plan with patient, he/she acknowledged understanding and agreed with plan of care.  Return precautions given for new or worsening symptoms.  Case discussed with attending physician, Dr. Adela Lank,  who agrees with assessment and plan of care.  Garlon Hatchet, PA-C 07/02/15 1954  Melene Plan, DO 07/02/15 2233

## 2015-07-04 ENCOUNTER — Encounter: Payer: Self-pay | Admitting: Family Medicine

## 2015-07-04 ENCOUNTER — Ambulatory Visit (INDEPENDENT_AMBULATORY_CARE_PROVIDER_SITE_OTHER): Payer: 59 | Admitting: Family Medicine

## 2015-07-04 VITALS — BP 102/66 | HR 72 | Temp 98.1°F | Resp 16 | Ht 64.0 in | Wt 124.1 lb

## 2015-07-04 DIAGNOSIS — I82412 Acute embolism and thrombosis of left femoral vein: Secondary | ICD-10-CM

## 2015-07-04 DIAGNOSIS — I82409 Acute embolism and thrombosis of unspecified deep veins of unspecified lower extremity: Secondary | ICD-10-CM | POA: Insufficient documentation

## 2015-07-04 LAB — ANA: Anti Nuclear Antibody(ANA): NEGATIVE

## 2015-07-04 MED ORDER — HYDROCODONE-ACETAMINOPHEN 5-325 MG PO TABS: 1.0000 | ORAL_TABLET | Freq: Four times a day (QID) | ORAL | Status: DC | PRN

## 2015-07-04 NOTE — Progress Notes (Signed)
Pre visit review using our clinic review tool, if applicable. No additional management support is needed unless otherwise documented below in the visit note. 

## 2015-07-04 NOTE — Progress Notes (Signed)
   Subjective:    Patient ID: Bridget CaneJasmine N Pulley, female    DOB: [birth year], 25 y.o.   MRN: [id number]  HPI ER F/U- pt was in ER 5/13 after going to UC for L leg pain.  Venous doppler showerd 'acute, occlusive DVT noted in the left lower extremity, including the posterior tibial, peroneal, soleal, gastrocnemius, popliteal, femoral, common femoral veins, profunda, and at the saphenofemoral junction'.  She was started on Xarelto in the ER and told to stop her OCPs.  Pt had been having leg pain since Feb but declined additional w/u at that time.  No family hx of blood clots- was told that it is most likely due to her birth control.  Pt is not a smoker.  Pt has a sedentary job and rides a motorcycle.  Pt states she is done w/ OCPs.  Pt is in pain from groin to lower leg.   Review of Systems For ROS see HPI     Objective:   Physical Exam  Constitutional: She is oriented to person, place, and time. She appears well-developed and well-nourished.  HENT:  Head: Normocephalic and atraumatic.  Cardiovascular: Intact distal pulses.   Pulmonary/Chest: Effort normal. No respiratory distress. She has no wheezes. She has no rales.  Musculoskeletal: She exhibits edema (edema of entire L leg) and tenderness (TTP over L leg).  Neurological: She is alert and oriented to person, place, and time. No cranial nerve deficit. Coordination normal.  Skin: Skin is warm and dry. No erythema.  Psychiatric: She has a normal mood and affect. Her behavior is normal. Thought content normal.  Vitals reviewed.         Assessment & Plan:

## 2015-07-04 NOTE — Patient Instructions (Signed)
Follow up as needed We'll call you with your hematology appt If your toes are blue, cold, or you cannot find a pulse- please go to the ER!  Same applies for chest pain or shortness of breath! Call with any questions or concerns Hang in there!!!

## 2015-07-05 ENCOUNTER — Telehealth: Payer: Self-pay

## 2015-07-05 ENCOUNTER — Telehealth: Payer: Self-pay | Admitting: Family Medicine

## 2015-07-05 NOTE — Assessment & Plan Note (Signed)
New.  Pt has extensive L leg DVT that is reported as 'near occlusive'.  Pt was on OCPs but no other risk factors.  No family hx of clot.  Based on extensive degree of clotting, will refer to hematology for a hypercoagulable work up.  Pt is comfortable taking her Xarelto and thus far is not having side effects.  Due to degree of pain, will start pain meds prn.  Reviewed supportive care and red flags that should prompt return.  Pt expressed understanding and is in agreement w/ plan.

## 2015-07-05 NOTE — Telephone Encounter (Signed)
Notes Recorded by Rogers Seedsallie A Sundi Slevin, Rad Tech on 07/05/2015 at 11:39 AM Left VM to call back to confirm appt with Dr. Beverely Lowabori  Notes Recorded by Tonye Pearsonobert P Doolittle, MD on 07/04/2015 at 8:26 PM She was seen in ER--is supposed to see Dr Beverely Lowabori in F/U--call to be sure she has this appt as she needs to be seen fairly quickly

## 2015-07-05 NOTE — Telephone Encounter (Signed)
Ok for work note indicating pt can return on Monday 5/23 (she can return sooner if she would like but I'm comfortable w/ her being out until Monday due to her pain and swelling)

## 2015-07-05 NOTE — Telephone Encounter (Signed)
Pt calling to report she was seen in the office on yesterday(07/04/15) and has not returned to work.  She is requesting a work note be faxed to [employer]),  stating she has been written out of work.  The note should indicate when she is able to return to work.  Please fax to [phone], Attn: Judene CompanionMary McFadden.

## 2015-07-06 NOTE — Telephone Encounter (Signed)
Letter faxed.

## 2015-07-07 ENCOUNTER — Telehealth: Payer: Self-pay | Admitting: Family Medicine

## 2015-07-07 NOTE — Telephone Encounter (Signed)
Pt called asking for a note to give to her part time job (reprinted the one you wrote for full time and you have already signed it), also pt asking for a note to release her from her obligation for a month to [employer] due to her injury.

## 2015-07-07 NOTE — Telephone Encounter (Signed)
Letters printed and placed at front desk for pick up.

## 2015-07-07 NOTE — Telephone Encounter (Signed)
Ok to provide note indicating that due to medical reasons, she will not be able to exercise for 1 month from date of DVT so that she will not have to pay the monthly fee

## 2015-07-08 ENCOUNTER — Telehealth: Payer: Self-pay

## 2015-07-08 NOTE — Telephone Encounter (Signed)
Pt has been called and made aware that [facility] and pt should be receiving a call for scheduling.

## 2015-07-08 NOTE — Telephone Encounter (Signed)
Pt mom called stating that she was told someone would have called her/ the patient by today regarding the referral that was submitted for pt blood clots and they have not heard from anyone. Pt mom would like a call back today [phone].

## 2015-07-08 NOTE — Telephone Encounter (Signed)
Bridget Mcbride can you check on this referral it was Placed as a STAT to hematology, and still says ready for initial scheduling

## 2015-07-11 ENCOUNTER — Telehealth: Payer: Self-pay | Admitting: Family Medicine

## 2015-07-11 NOTE — Telephone Encounter (Signed)
Pt is still waiting for referral to go through for scheduling, I called to check status w/[facility] spoke with Debby Budndre' and he states that referral is up for review w/Dr. Pt is concern about waiting and wants to know what she is do.

## 2015-07-11 NOTE — Telephone Encounter (Signed)
There is no concern w/ waiting- she's on the appropriate blood thinner and the hematology work up is just to make sure there is not an underlying problem for when she goes off the blood thinner in 6 months

## 2015-07-11 NOTE — Telephone Encounter (Signed)
Patient notified of PCP recommendations and is agreement and expresses an understanding.  

## 2015-07-15 ENCOUNTER — Ambulatory Visit: Payer: 59

## 2015-07-15 ENCOUNTER — Other Ambulatory Visit (HOSPITAL_BASED_OUTPATIENT_CLINIC_OR_DEPARTMENT_OTHER): Payer: 59

## 2015-07-15 ENCOUNTER — Ambulatory Visit (HOSPITAL_BASED_OUTPATIENT_CLINIC_OR_DEPARTMENT_OTHER): Payer: 59 | Admitting: Hematology & Oncology

## 2015-07-15 ENCOUNTER — Encounter: Payer: Self-pay | Admitting: Hematology & Oncology

## 2015-07-15 VITALS — BP 95/58 | HR 71 | Temp 98.1°F | Resp 14 | Ht 64.0 in | Wt 123.0 lb

## 2015-07-15 DIAGNOSIS — I82402 Acute embolism and thrombosis of unspecified deep veins of left lower extremity: Secondary | ICD-10-CM | POA: Diagnosis not present

## 2015-07-15 DIAGNOSIS — I82409 Acute embolism and thrombosis of unspecified deep veins of unspecified lower extremity: Secondary | ICD-10-CM

## 2015-07-15 LAB — CBC WITH DIFFERENTIAL (CANCER CENTER ONLY)
BASO#: 0 10*3/uL (ref 0.0–0.2)
BASO%: 0.5 % (ref 0.0–2.0)
EOS ABS: 0.3 10*3/uL (ref 0.0–0.5)
EOS%: 4.8 % (ref 0.0–7.0)
HEMATOCRIT: 36.9 % (ref 34.8–46.6)
HEMOGLOBIN: 12.5 g/dL (ref 11.6–15.9)
LYMPH#: 1.9 10*3/uL (ref 0.9–3.3)
LYMPH%: 32.9 % (ref 14.0–48.0)
MCH: 29 pg (ref 26.0–34.0)
MCHC: 33.9 g/dL (ref 32.0–36.0)
MCV: 86 fL (ref 81–101)
MONO#: 0.4 10*3/uL (ref 0.1–0.9)
MONO%: 6.3 % (ref 0.0–13.0)
NEUT%: 55.5 % (ref 39.6–80.0)
NEUTROS ABS: 3.3 10*3/uL (ref 1.5–6.5)
Platelets: 368 10*3/uL (ref 145–400)
RBC: 4.31 10*6/uL (ref 3.70–5.32)
RDW: 11.9 % (ref 11.1–15.7)
WBC: 5.9 10*3/uL (ref 3.9–10.0)

## 2015-07-15 MED ORDER — RIVAROXABAN 20 MG PO TABS: 20.0000 mg | ORAL_TABLET | Freq: Every day | ORAL | Status: DC

## 2015-07-15 NOTE — Progress Notes (Signed)
Referral MD  Reason for Referral: Thromboembolic disease of the left leg   Chief Complaint  Patient presents with  . Other    New Patient  : I will blood clot in my left leg.  HPI: Bridget Mcbride is a very nice 25 year old African-American female. She has no significant past medical history. She was on oral contraceptives. She been on these for about 6 years.  A couple weeks ago, she began have some pain in the left leg. There was some swelling.  Of note, she said that she been on different oral contraceptives.  They swelling and pain got or prominent. She went to urgent care on May 13. She then had a Doppler done. She had a thrombus extending from the posterior tibial vein up to the common femoral vein.  She started on Xarelto. She stopped her oral contraceptive.  She was then referred to the [facility] for an evaluation.  She does not smoke. She only drinks socially. She works but does not have any issues at work with sitting or walking.  There is no family history of blood clots. There's no history of miscarriages most women. [name] has never been pregnant.  She was referred to the [facility] to see how week and help manage her blood clot.  She has no cough. There's no shortness of breath. She's had no chest wall pain. She's had no hemoptysis.  Her monthly cycles have been a little bit erratic.  She's had no change in bowel or bladder habits. She thought that she may have had a blood clot in the right leg several weeks prior but did not get anything done because of the co-pay for the Doppler test.  Overall, her performance status is ECOG 0.                Past Medical History  Diagnosis Date  . Chlamydia   . Anemia   . BV (bacterial vaginosis)   . GERD (gastroesophageal reflux disease)   :  No past surgical history on file.:   Current outpatient prescriptions:  .  fluconazole (DIFLUCAN) 150 MG tablet,  Take 150 mg by mouth as needed., Disp: , Rfl:  .  HYDROcodone-acetaminophen (NORCO/VICODIN) 5-325 MG tablet, Take 1 tablet by mouth every 6 (six) hours as needed for moderate pain., Disp: 60 tablet, Rfl: 0 .  metroNIDAZOLE (METROCREAM) 0.75 % cream, Apply 1 application topically 2 (two) times daily. PRN, Disp: , Rfl:  .  Rivaroxaban 15 & 20 MG TBPK, Take as directed on package: Start with one  tablet by mouth twice a day with food. On Day 22, switch to one  tablet once a day with food., Disp: 51 each, Rfl: 0 .  valACYclovir (VALTREX) 500 MG tablet, Take 500 mg by mouth as needed., Disp: , Rfl:  .  rivaroxaban (XARELTO) 20 MG TABS tablet, Take 1 tablet (20 mg total) by mouth daily with supper., Disp: 30 tablet, Rfl: 12:  :  No Known Allergies:  Family History  Problem Relation Age of Onset  . Hypertension Mother   . Migraines Father   . Hypertension Maternal Aunt   . Hypertension Maternal Uncle   . Stroke Maternal Uncle   . Diabetes Paternal Aunt   . Arthritis Paternal Grandmother   . Hypertension Maternal Aunt   . Hypertension Maternal Aunt   . Hypertension Maternal Aunt   . Stroke Cousin   :  Social History   Social History  .  Marital Status: Single    Spouse Name: N/A  . Number of Children: N/A  . Years of Education: N/A   Occupational History  . Not on file.   Social History Main Topics  . Smoking status: Never Smoker   . Smokeless tobacco: Never Used  . Alcohol Use: 0.0 oz/week    0 Standard drinks or equivalent per week     Comment: occasional  . Drug Use: No  . Sexual Activity: No   Other Topics Concern  . Not on file   Social History Narrative  :  Pertinent items are noted in HPI.  Exam: @IPVITALS @  well-developed and well-nourished [place]. She actually is quite petite. Vital signs show a temperature of 98.1. Pulse 71. Blood pressure 95/58. Weight is 123 pounds. Head and neck exam shows no ocular or oral lesions. She has no  palpable cervical or supraclavicular lymph nodes. Lungs are clear bilaterally. Cardiac exam regular rate and rhythm with no murmurs, rubs or bruits. Abdomen is soft. She has good bowel sounds. There is no palpable abdominal mass prism palpable hepatosplenomegaly. Back exam shows no tenderness over the spine, ribs or hips. Extremities shows some nonpitting edema of the left leg. She has a negative Homans sign. Right leg is unremarkable. Skin exam shows no rashes, ecchymoses or petechia. Neurological exam shows no focal neurological deficits.    Recent Labs  07/15/15 0848  WBC 5.9  HGB 12.5  HCT 36.9  PLT 368   No results for input(s): NA, K, CL, CO2, GLUCOSE, BUN, CREATININE, CALCIUM in the last 72 hours.  Blood smear review:  None  Pathology: None     Assessment and Plan:  Bridget Mcbride is a very charming 25 year old Afro-American female. She has a thromboembolic event with her left leg. This happened while she was on oral contraceptives. I don't know if the change in oral contraceptives made a difference.  She obviously cannot be on oral contraceptives in the future.  It seems likely Xarelto is working pretty well. Her left leg is not as swollen.  I think that she will need at least 6 months of anticoagulation. I may elect to put her on for a year.  It is likely we will not find any hypercoagulable state but given that she is so young, and this happened while she was on oral contraceptives, she certainly may have an underlying hypercoagulable condition.  I told her that the 1 issue that she will have to take about is when she becomes pregnant, she will need anticoagulation for her pregnancy and afterwards. This would be with Lovenox. She was worried about having to do this.  I told her that she cancer have some I called beverages on occasion. She is not to drink every day. She can eat what she wants. She has a motorcycle. I do not see any problems with her riding on the  motorcycle  I told her that if she wants take Advil on occasion, that she can take this as long she takes it with food and not on empty stomach.  I probably would repeat her Doppler in 3 months. I think this would be very reasonable. Hopefully, we will see that most of the thrombus has resolved.  I told her that it would probably take about a week or so to get the hypercoagulable studies back.  I spent about 45 minutes with her. She is very nice. I answered all of her questions.

## 2015-07-16 LAB — D-DIMER, QUANTITATIVE (NOT AT ARMC): D-DIMER: 1.77 mg{FEU}/L — AB (ref 0.00–0.49)

## 2015-07-17 LAB — PROTEIN S, TOTAL: PROTEIN S AG TOTAL: 136 % (ref 60–150)

## 2015-07-17 LAB — ANTITHROMBIN III: Antithrombin Activity: 135 % (ref 75–135)

## 2015-07-17 LAB — PROTEIN S ACTIVITY: PROTEIN S ACTIVITY: 164 % — AB (ref 63–140)

## 2015-07-17 LAB — PROTEIN C ACTIVITY: PROTEIN C ACTIVITY: 132 % (ref 73–180)

## 2015-07-18 LAB — PROTEIN C, TOTAL: PROTEIN C ANTIGEN: 87 % (ref 60–150)

## 2015-07-19 LAB — BETA-2-GLYCOPROTEIN I ABS, IGG/M/A: Beta-2 Glycoprotein I Ab, IgG: <9 GPI IgG units (ref 0–20)

## 2015-07-19 LAB — LUPUS ANTICOAGULANT PANEL
PTT-LA: 38.5 s (ref 0.0–43.6)
dRVVT Confirm: 1.8 ratio — ABNORMAL HIGH (ref 0.8–1.2)
dRVVT Mix: 61.9 s — ABNORMAL HIGH (ref 0.0–47.0)
dRVVT: 97.6 s — ABNORMAL HIGH (ref 0.0–47.0)

## 2015-07-19 LAB — CARDIOLIPIN ANTIBODIES, IGG, IGM, IGA
Anticardiolipin Ab,IgA,Qn: <9 APL U/mL (ref 0–11)
Anticardiolipin Ab,IgG,Qn: <9 GPL U/mL (ref 0–14)

## 2015-07-21 LAB — PROTHROMBIN GENE MUTATION

## 2015-07-22 LAB — FACTOR 5 LEIDEN

## 2015-08-08 ENCOUNTER — Other Ambulatory Visit: Payer: Self-pay | Admitting: Hematology & Oncology

## 2015-08-08 DIAGNOSIS — I82401 Acute embolism and thrombosis of unspecified deep veins of right lower extremity: Secondary | ICD-10-CM

## 2015-08-08 NOTE — Telephone Encounter (Signed)
I left a message on her answer machine about the results of her hypercoagulable panel. Everything looks normal outside of the positive lupus anticoagulant. I'm not sure how significant this is. By criteria, we have to repeat this when we see her back. It will not change how we manage her with anticoagulation right now.  I told her she was cost back if she has any questions.  Again I told her that the actual blood clotting studies and conditions are all normal area and we just have to repeat this lupus anticoagulant.  Bridget Mcbride

## 2015-09-15 LAB — HM PAP SMEAR

## 2015-09-26 ENCOUNTER — Encounter: Payer: Self-pay | Admitting: General Practice

## 2015-10-11 ENCOUNTER — Ambulatory Visit (HOSPITAL_BASED_OUTPATIENT_CLINIC_OR_DEPARTMENT_OTHER)
Admission: RE | Admit: 2015-10-11 | Discharge: 2015-10-11 | Disposition: A | Discharge status: Home / Self Care | Payer: BLUE CROSS/BLUE SHIELD | Source: Ambulatory Visit | Attending: Hematology & Oncology | Admitting: Hematology & Oncology

## 2015-10-11 ENCOUNTER — Telehealth: Payer: Self-pay | Admitting: *Deleted

## 2015-10-11 ENCOUNTER — Other Ambulatory Visit: Payer: Self-pay

## 2015-10-11 ENCOUNTER — Ambulatory Visit: Payer: Self-pay | Admitting: Hematology & Oncology

## 2015-10-11 DIAGNOSIS — I82409 Acute embolism and thrombosis of unspecified deep veins of unspecified lower extremity: Secondary | ICD-10-CM | POA: Diagnosis present

## 2015-10-11 NOTE — Telephone Encounter (Signed)
-----   Message from Josph MachoPeter R Ennever, MD sent at 10/11/2015  1:45 PM EDT ----- Call - NO blood clot in the LEFT leg!!! Yeah!!!  Bridget Mcbride

## 2015-10-12 ENCOUNTER — Encounter: Payer: Self-pay | Admitting: Hematology & Oncology

## 2015-10-12 ENCOUNTER — Other Ambulatory Visit (HOSPITAL_BASED_OUTPATIENT_CLINIC_OR_DEPARTMENT_OTHER): Payer: BLUE CROSS/BLUE SHIELD

## 2015-10-12 ENCOUNTER — Ambulatory Visit (HOSPITAL_BASED_OUTPATIENT_CLINIC_OR_DEPARTMENT_OTHER): Payer: BLUE CROSS/BLUE SHIELD | Admitting: Hematology & Oncology

## 2015-10-12 VITALS — BP 113/64 | HR 77 | Temp 99.3°F | Resp 16 | Ht 64.0 in | Wt 123.0 lb

## 2015-10-12 DIAGNOSIS — D6862 Lupus anticoagulant syndrome: Secondary | ICD-10-CM

## 2015-10-12 DIAGNOSIS — Z86718 Personal history of other venous thrombosis and embolism: Secondary | ICD-10-CM | POA: Diagnosis not present

## 2015-10-12 DIAGNOSIS — Z7901 Long term (current) use of anticoagulants: Secondary | ICD-10-CM

## 2015-10-12 DIAGNOSIS — I82409 Acute embolism and thrombosis of unspecified deep veins of unspecified lower extremity: Secondary | ICD-10-CM

## 2015-10-12 DIAGNOSIS — I82402 Acute embolism and thrombosis of unspecified deep veins of left lower extremity: Secondary | ICD-10-CM

## 2015-10-12 DIAGNOSIS — I82401 Acute embolism and thrombosis of unspecified deep veins of right lower extremity: Secondary | ICD-10-CM

## 2015-10-12 LAB — COMPREHENSIVE METABOLIC PANEL (CC13)
ALK PHOS: 58 IU/L (ref 39–117)
ALT: 9 IU/L (ref 0–32)
AST: 16 IU/L (ref 0–40)
Albumin, Serum: 4.5 g/dL (ref 3.5–5.5)
Albumin/Globulin Ratio: 1.4 (ref 1.2–2.2)
BILIRUBIN TOTAL: 0.3 mg/dL (ref 0.0–1.2)
BUN/Creatinine Ratio: 16 (ref 9–23)
BUN: 14 mg/dL (ref 6–20)
CHLORIDE: 96 mmol/L (ref 96–106)
CO2: 26 mmol/L (ref 18–29)
CREATININE: 0.88 mg/dL (ref 0.57–1.00)
Calcium, Ser: 9 mg/dL (ref 8.7–10.2)
GFR calc Af Amer: 105 mL/min/{1.73_m2} (ref >59)
GFR calc non Af Amer: 91 mL/min/{1.73_m2} (ref >59)
GLOBULIN, TOTAL: 3.2 g/dL (ref 1.5–4.5)
GLUCOSE: 104 mg/dL — AB (ref 65–99)
Potassium, Ser: 3.4 mmol/L — ABNORMAL LOW (ref 3.5–5.2)
SODIUM: 127 mmol/L — AB (ref 134–144)
Total Protein: 7.7 g/dL (ref 6.0–8.5)

## 2015-10-12 LAB — CBC WITH DIFFERENTIAL (CANCER CENTER ONLY)
BASO#: 0 10*3/uL (ref 0.0–0.2)
BASO%: 0.5 % (ref 0.0–2.0)
EOS%: 1.4 % (ref 0.0–7.0)
Eosinophils Absolute: 0.1 10*3/uL (ref 0.0–0.5)
HEMATOCRIT: 41.4 % (ref 34.8–46.6)
HGB: 14.3 g/dL (ref 11.6–15.9)
LYMPH#: 2.1 10*3/uL (ref 0.9–3.3)
LYMPH%: 36.9 % (ref 14.0–48.0)
MCH: 29.3 pg (ref 26.0–34.0)
MCHC: 34.5 g/dL (ref 32.0–36.0)
MCV: 85 fL (ref 81–101)
MONO#: 0.3 10*3/uL (ref 0.1–0.9)
MONO%: 4.7 % (ref 0.0–13.0)
NEUT#: 3.3 10*3/uL (ref 1.5–6.5)
NEUT%: 56.5 % (ref 39.6–80.0)
Platelets: 178 10*3/uL (ref 145–400)
RBC: 4.88 10*6/uL (ref 3.70–5.32)
RDW: 12.7 % (ref 11.1–15.7)
WBC: 5.8 10*3/uL (ref 3.9–10.0)

## 2015-10-12 NOTE — Progress Notes (Signed)
Hematology and Oncology Follow Up Visit  Bridget Mcbride [id number] 1990/09/09 25 y.o. 10/12/2015   Principle Diagnosis:   DVT of the LEFT posterior tibial vein to the common femoral vein  Current Therapy:    Xarelto 20 mg po q day     Interim History:  [name] is back for follow-up. We saw her initially on May 26. At that point time, we do some hypercoagulable studies. Of course, there is also show that she had a lupus anticoagulant. All her other studies were negative.  We did go ahead and repeat a Doppler of her leg today. This did not show any evidence of thromboembolic disease.  The left leg feels well. She has occasional tightness. This mostly is in the right leg however.  She's had no problems with bleeding or bruising from the Xarelto. She's had no excessive monthly cycles.  She's had no rashes. She's had no cough or shortness of breath. Has been no chest wall discomfort.  She is still working full-time. She may go to the beach for [place] weekend.  Overall, her performance status is ECOG 0.  Medications:  Current Outpatient Prescriptions:  .  Cholecalciferol (VITAMIN D3) 50000 units CAPS, Take 50,000 capsules by mouth once a week., Disp: , Rfl:  .  fluconazole (DIFLUCAN) 150 MG tablet, Take 150 mg by mouth as needed., Disp: , Rfl:  .  rivaroxaban (XARELTO) 20 MG TABS tablet, Take 1 tablet (20 mg total) by mouth daily with supper., Disp: 30 tablet, Rfl: 12 .  valACYclovir (VALTREX) 500 MG tablet, Take 500 mg by mouth as needed., Disp: , Rfl:   Allergies: No Known Allergies  Past Medical History, Surgical history, Social history, and Family History were reviewed and updated.  Review of Systems: As above  Physical Exam:  height is 5\' 4"  (1.626 m) and weight is 123 lb (55.8 kg). Her oral temperature is 99.3 F (37.4 C). Her blood pressure is 113/64 and her pulse is 77. Her respiration is 16.   Wt Readings from Last 3 Encounters:  10/12/15 123 lb  (55.8 kg)  07/15/15 123 lb (55.8 kg)  07/04/15 124 lb 2 oz (56.3 kg)     Head and neck exam shows no ocular or oral lesions. She has no palpable cervical or supraclavicular lymph nodes. Lungs are clear bilaterally. Cardiac exam regular rate and rhythm with no murmurs, rubs or bruits. Abdomen is soft. She has good bowel sounds. There is no palpable abdominal mass prism palpable hepatosplenomegaly. Back exam shows no tenderness over the spine, ribs or hips. Extremities shows some minimal nonpitting edema of the left leg. She has a negative Homan's sign. Right leg is unremarkable. Skin exam shows no rashes, ecchymoses or petechia. Neurological exam shows no focal neurological deficits.   Lab Results  Component Value Date   WBC 5.8 10/12/2015   HGB 14.3 10/12/2015   HCT 41.4 10/12/2015   MCV 85 10/12/2015   PLT 178 10/12/2015     Chemistry      Component Value Date/Time   NA 137 07/02/2015 1626   K 4.1 07/02/2015 1626   CL 103 07/02/2015 1626   CO2 24 07/02/2015 1626   BUN 14 07/02/2015 1626   CREATININE 0.78 07/02/2015 1626   CREATININE 0.81 07/02/2015 1222      Component Value Date/Time   CALCIUM 9.2 07/02/2015 1626   ALKPHOS 67 07/02/2015 1222   AST 13 07/02/2015 1222   ALT 7 07/02/2015 1222   BILITOT 0.5  07/02/2015 1222         Impression and Plan: [name] is a[age] year old African-American female. She had a thrombus in the left leg.  I'm not convinced of this lupus anticoagulant is truly positive. We are checking this again today.  I do think that she probably would be a good candidate for maintenance Xarelto for 1 year. I think some of the newer studies seem to suggest that this is a good way of helping patients with thromboembolic disease and decreasing that risk of recurrence.  I talked to her about this. She is agreeable to the low-dose Xarelto. She has 1 more month of full dose Xarelto that she will pick up at the drugstore. After that, we will put her on  low-dose.  We will get her back here in 6 months. I did this would be reasonable. I will check the lupus anticoagulant again.       Josph MachoENNEVER,PETER R, MD 8/23/20175:33 PM

## 2015-10-13 LAB — D-DIMER, QUANTITATIVE: D-DIMER: <0.2 mg/L FEU (ref 0.00–0.49)

## 2015-10-14 ENCOUNTER — Telehealth: Payer: Self-pay

## 2015-10-14 LAB — LUPUS ANTICOAGULANT PANEL
DRVVT MIX: 45.1 s (ref 0.0–47.0)
DRVVT: 52.6 s — AB (ref 0.0–47.0)
PTT-LA: 31.9 s (ref 0.0–51.9)

## 2015-10-14 NOTE — Telephone Encounter (Addendum)
-----   Message from Josph MachoPeter R Ennever, MD sent at 10/14/2015 10:18 AM EDT ----- Call - NO lupus anti-coagulant is found!!  Keep taking your same medications.  pete  Attempted to contact patient with above message. Received answer stating pt's VM had not been set up. Will continue to attempt to reach. dph

## 2015-10-18 ENCOUNTER — Telehealth: Payer: Self-pay | Admitting: *Deleted

## 2015-10-18 ENCOUNTER — Other Ambulatory Visit: Payer: Self-pay | Admitting: *Deleted

## 2015-10-18 MED ORDER — RIVAROXABAN 10 MG PO TABS: 10.0000 mg | ORAL_TABLET | Freq: Every day | ORAL | 12 refills | Status: DC

## 2015-10-18 NOTE — Telephone Encounter (Signed)
-----   Message from Josph MachoPeter R Ennever, MD sent at 10/14/2015 10:18 AM EDT ----- Call - NO lupus anti-coagulant is found!!  Keep taking your same medications.  pete

## 2016-04-13 ENCOUNTER — Telehealth: Payer: Self-pay | Admitting: *Deleted

## 2016-04-13 NOTE — Telephone Encounter (Signed)
Patient notifying the office that she injured her foot yesterday morning. She hit her middle toe on the tub and it's now swollen and bruised. She wants to know if she should be seen prior to next Wednesday already scheduled appointment.   Spoke to Dr Myna HidalgoEnnever He just wants patient to use ice and follow up at her already scheduled appointment. Patient aware of plan.

## 2016-04-18 ENCOUNTER — Ambulatory Visit (HOSPITAL_BASED_OUTPATIENT_CLINIC_OR_DEPARTMENT_OTHER): Payer: BLUE CROSS/BLUE SHIELD | Admitting: Hematology & Oncology

## 2016-04-18 ENCOUNTER — Other Ambulatory Visit (HOSPITAL_BASED_OUTPATIENT_CLINIC_OR_DEPARTMENT_OTHER): Payer: BLUE CROSS/BLUE SHIELD

## 2016-04-18 ENCOUNTER — Telehealth: Payer: Self-pay | Admitting: *Deleted

## 2016-04-18 ENCOUNTER — Ambulatory Visit (HOSPITAL_BASED_OUTPATIENT_CLINIC_OR_DEPARTMENT_OTHER)
Admission: RE | Admit: 2016-04-18 | Discharge: 2016-04-18 | Disposition: A | Discharge status: Home / Self Care | Payer: BLUE CROSS/BLUE SHIELD | Source: Ambulatory Visit | Attending: Hematology & Oncology | Admitting: Hematology & Oncology

## 2016-04-18 VITALS — BP 118/68 | HR 76 | Temp 97.9°F | Resp 20 | Wt 135.8 lb

## 2016-04-18 DIAGNOSIS — Z7901 Long term (current) use of anticoagulants: Secondary | ICD-10-CM | POA: Diagnosis not present

## 2016-04-18 DIAGNOSIS — D6862 Lupus anticoagulant syndrome: Secondary | ICD-10-CM

## 2016-04-18 DIAGNOSIS — I824Z2 Acute embolism and thrombosis of unspecified deep veins of left distal lower extremity: Secondary | ICD-10-CM

## 2016-04-18 DIAGNOSIS — I82442 Acute embolism and thrombosis of left tibial vein: Secondary | ICD-10-CM | POA: Diagnosis not present

## 2016-04-18 DIAGNOSIS — R200 Anesthesia of skin: Secondary | ICD-10-CM | POA: Insufficient documentation

## 2016-04-18 DIAGNOSIS — Z86718 Personal history of other venous thrombosis and embolism: Secondary | ICD-10-CM | POA: Diagnosis not present

## 2016-04-18 DIAGNOSIS — I82402 Acute embolism and thrombosis of unspecified deep veins of left lower extremity: Secondary | ICD-10-CM

## 2016-04-18 LAB — COMPREHENSIVE METABOLIC PANEL
ALBUMIN: 4.2 g/dL (ref 3.5–5.0)
ALK PHOS: 74 U/L (ref 40–150)
ALT: 13 U/L (ref 0–55)
AST: 17 U/L (ref 5–34)
Anion Gap: 8 mEq/L (ref 3–11)
BILIRUBIN TOTAL: 0.31 mg/dL (ref 0.20–1.20)
BUN: 21.1 mg/dL (ref 7.0–26.0)
CALCIUM: 9.4 mg/dL (ref 8.4–10.4)
CO2: 27 mEq/L (ref 22–29)
Chloride: 106 mEq/L (ref 98–109)
Creatinine: 1.1 mg/dL (ref 0.6–1.1)
EGFR: 79 mL/min/{1.73_m2} — ABNORMAL LOW (ref >90)
GLUCOSE: 53 mg/dL — AB (ref 70–140)
Potassium: 3.9 mEq/L (ref 3.5–5.1)
SODIUM: 141 meq/L (ref 136–145)
TOTAL PROTEIN: 7.7 g/dL (ref 6.4–8.3)

## 2016-04-18 LAB — CBC WITH DIFFERENTIAL (CANCER CENTER ONLY)
BASO#: 0 10*3/uL (ref 0.0–0.2)
BASO%: 0.8 % (ref 0.0–2.0)
EOS ABS: 0.3 10*3/uL (ref 0.0–0.5)
EOS%: 7.6 % — AB (ref 0.0–7.0)
HEMATOCRIT: 41.7 % (ref 34.8–46.6)
HEMOGLOBIN: 14.4 g/dL (ref 11.6–15.9)
LYMPH#: 1.8 10*3/uL (ref 0.9–3.3)
LYMPH%: 47.4 % (ref 14.0–48.0)
MCH: 30.5 pg (ref 26.0–34.0)
MCHC: 34.5 g/dL (ref 32.0–36.0)
MCV: 88 fL (ref 81–101)
MONO#: 0.3 10*3/uL (ref 0.1–0.9)
MONO%: 8.2 % (ref 0.0–13.0)
NEUT%: 36 % — ABNORMAL LOW (ref 39.6–80.0)
NEUTROS ABS: 1.4 10*3/uL — AB (ref 1.5–6.5)
Platelets: 183 10*3/uL (ref 145–400)
RBC: 4.72 10*6/uL (ref 3.70–5.32)
RDW: 12.2 % (ref 11.1–15.7)
WBC: 3.8 10*3/uL — AB (ref 3.9–10.0)

## 2016-04-18 NOTE — Progress Notes (Signed)
Hematology and Oncology Follow Up Visit  Bridget Mcbride [id number] 1991/01/09 26 y.o. 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 She is now on maintenance Xarelto. She is doing well with maintenance Xarelto. Of note her last Doppler showed a resolution of the thrombus in the left leg.  Her complaint now is some numbness in the right leg. This started a couple days ago. There was no trauma. There is no bowel or bladder issues. She's had no weakness. This seems to be more so on the lateral thigh. She has no bruising.  She denies any back pain. There is no headache. She's had no visual changes.  She says that when she is exercising, there is some slight swelling in her left leg. I suspect that she probably has some valvular damage in the veins. I told that she has to wear a compression stocking on the left leg when she exercises.  She is still working full-time.  She's had no cough or shortness of breath. There's been no issues with her monthly cycles. She's had no change in bowel or bladder habits. She's had no nausea or vomiting. She's had no problem with infections during the influenza season.  Overall, her performance status is ECOG 0.  Medications:  Current Outpatient Prescriptions:  .  rivaroxaban (XARELTO) 10 MG TABS tablet, Take 1 tablet (10 mg total) by mouth daily., Disp: 30 tablet, Rfl: 12 .  valACYclovir (VALTREX) 500 MG tablet, Take 500 mg by mouth as needed., Disp: , Rfl:   Allergies: No Known Allergies  Past Medical History, Surgical history, Social history, and Family History were reviewed and updated.  Review of Systems: As above  Physical Exam:  weight is 135 lb 12.8 oz (61.6 kg). Her oral temperature is 97.9 F (36.6 C). Her blood pressure is 118/68 and  her pulse is 76. Her respiration is 20.   Wt Readings from Last 3 Encounters:  04/18/16 135 lb 12.8 oz (61.6 kg)  10/12/15 123 lb (55.8 kg)  07/15/15 123 lb (55.8 kg)     Head and neck exam shows no ocular or oral lesions. She has no palpable cervical or supraclavicular lymph nodes. Lungs are clear bilaterally. Cardiac exam regular rate and rhythm with no murmurs, rubs or bruits. Abdomen is soft. She has good bowel sounds. There is no palpable abdominal mass prism palpable hepatosplenomegaly. Back exam shows no tenderness over the spine, ribs or hips. No spasms are noted in the lumbar paravertebral muscles. Extremities shows some minimal nonpitting edema of the left leg. She has a negative Homan's sign. Right leg is without swelling, rash, weakness. She has decent pulses in the distal extremities. She has good range of motion of the joints. She has a negative Homans sign. There is a negative straight leg raise. Skin exam shows no rashes, ecchymoses or petechia. Neurological exam shows no focal neurological deficits.   Lab Results  Component Value Date   WBC 3.8 (L) 04/18/2016   HGB 14.4 04/18/2016   HCT 41.7 04/18/2016   MCV 88 04/18/2016   PLT 183 04/18/2016     Chemistry      Component Value Date/Time   NA 127 (L) 10/12/2015 1452   K 3.4 (L) 10/12/2015 1452   CL 96 10/12/2015  1452   CO2 26 10/12/2015 1452   BUN 14 10/12/2015 1452   CREATININE 0.88 10/12/2015 1452   CREATININE 0.81 07/02/2015 1222      Component Value Date/Time   CALCIUM 9.0 10/12/2015 1452   ALKPHOS 58 10/12/2015 1452   AST 16 10/12/2015 1452   ALT 9 10/12/2015 1452   BILITOT 0.3 10/12/2015 1452         Impression and Plan: Bridget Mcbride is a 26 year old African-American female. She had a thrombus in the left leg.On her last Doppler, there was no thrombus in the left leg. Her last lupus anticoagulant test was also negative.   I'm not sure what is going on with the numbness in the right leg. She does  not have a swollen leg. She has decent pulses. There is no rash. There is no weakness.  I will check a Doppler of the right leg today.   She is on low-dose Xarelto. I don't think this is anything neurologic. I do not know if it might be coming from her back.  If the Doppler is negative, then I told her that she probably needs to see her family doctor to see what needs to be done.   I will like to see her back in 3 weeks just to follow-up.      [name], MD 2/28/20189:07 AM

## 2016-04-18 NOTE — Telephone Encounter (Addendum)
Patient aware of results  ----- Message from Josph MachoPeter R Ennever, MD sent at 04/18/2016 11:55 AM EST ----- Call - the doppler is NORMAL -- NO blood clot in the right leg!!! I will set up an MRI of your lower back to make sure that there is no issues with a bulging disc!!!  Bridget Mcbride

## 2016-04-18 NOTE — Addendum Note (Signed)
Addended by: Arlan OrganENNEVER, PETER R on: 04/18/2016 12:01 PM   Modules accepted: Orders

## 2016-04-19 ENCOUNTER — Ambulatory Visit (HOSPITAL_COMMUNITY): Admission: RE | Admit: 2016-04-19 | Payer: BLUE CROSS/BLUE SHIELD | Source: Ambulatory Visit

## 2016-04-20 LAB — LUPUS ANTICOAGULANT PANEL
DRVVT MIX: 60.2 s — AB (ref 0.0–47.0)
PTT-LA: 37.8 s (ref 0.0–51.9)
dRVVT Confirm: 1.5 ratio — ABNORMAL HIGH (ref 0.8–1.2)
dRVVT: 83.5 s — ABNORMAL HIGH (ref 0.0–47.0)

## 2016-05-04 ENCOUNTER — Ambulatory Visit (HOSPITAL_COMMUNITY): Admission: RE | Admit: 2016-05-04 | Payer: BLUE CROSS/BLUE SHIELD | Source: Ambulatory Visit

## 2016-05-04 ENCOUNTER — Ambulatory Visit (HOSPITAL_COMMUNITY)
Admission: RE | Admit: 2016-05-04 | Discharge: 2016-05-04 | Disposition: A | Discharge status: Home / Self Care | Payer: BLUE CROSS/BLUE SHIELD | Source: Ambulatory Visit | Attending: Hematology & Oncology | Admitting: Hematology & Oncology

## 2016-05-04 DIAGNOSIS — M4696 Unspecified inflammatory spondylopathy, lumbar region: Secondary | ICD-10-CM | POA: Insufficient documentation

## 2016-05-04 DIAGNOSIS — R200 Anesthesia of skin: Secondary | ICD-10-CM | POA: Diagnosis present

## 2016-05-04 MED ORDER — GADOBENATE DIMEGLUMINE 529 MG/ML IV SOLN
15.0000 mL | Freq: Once | INTRAVENOUS | Status: AC | PRN
  Administered 2016-05-04: 12 mL via INTRAVENOUS

## 2016-05-07 ENCOUNTER — Telehealth: Payer: Self-pay | Admitting: *Deleted

## 2016-05-07 NOTE — Telephone Encounter (Signed)
As noted by Dr. Myna HidalgoEnnever, I left a message informing patient that her MRI is normal. Nothing on the MRI accounts for her symptoms. He wants you to see your PCP to see what else can be done. Instructed patient to call [phone] if she had any questions.

## 2016-05-09 ENCOUNTER — Ambulatory Visit: Payer: Self-pay | Admitting: Hematology & Oncology

## 2016-05-09 ENCOUNTER — Other Ambulatory Visit: Payer: Self-pay

## 2016-05-15 ENCOUNTER — Other Ambulatory Visit: Payer: Self-pay | Admitting: *Deleted

## 2016-05-15 DIAGNOSIS — I824Z2 Acute embolism and thrombosis of unspecified deep veins of left distal lower extremity: Secondary | ICD-10-CM

## 2016-05-16 ENCOUNTER — Other Ambulatory Visit (HOSPITAL_BASED_OUTPATIENT_CLINIC_OR_DEPARTMENT_OTHER): Payer: BLUE CROSS/BLUE SHIELD

## 2016-05-16 ENCOUNTER — Ambulatory Visit (HOSPITAL_BASED_OUTPATIENT_CLINIC_OR_DEPARTMENT_OTHER): Payer: BLUE CROSS/BLUE SHIELD | Admitting: Family

## 2016-05-16 VITALS — BP 111/57 | HR 75 | Temp 98.9°F | Resp 19 | Wt 137.0 lb

## 2016-05-16 DIAGNOSIS — I824Z2 Acute embolism and thrombosis of unspecified deep veins of left distal lower extremity: Secondary | ICD-10-CM

## 2016-05-16 DIAGNOSIS — Z7901 Long term (current) use of anticoagulants: Secondary | ICD-10-CM | POA: Diagnosis not present

## 2016-05-16 DIAGNOSIS — Z86718 Personal history of other venous thrombosis and embolism: Secondary | ICD-10-CM | POA: Diagnosis not present

## 2016-05-16 LAB — CBC WITH DIFFERENTIAL (CANCER CENTER ONLY)
BASO#: 0 10*3/uL (ref 0.0–0.2)
BASO%: 0.4 % (ref 0.0–2.0)
EOS%: 3.6 % (ref 0.0–7.0)
Eosinophils Absolute: 0.3 10*3/uL (ref 0.0–0.5)
HCT: 40.3 % (ref 34.8–46.6)
HEMOGLOBIN: 13.9 g/dL (ref 11.6–15.9)
LYMPH#: 2.2 10*3/uL (ref 0.9–3.3)
LYMPH%: 31.2 % (ref 14.0–48.0)
MCH: 30.4 pg (ref 26.0–34.0)
MCHC: 34.5 g/dL (ref 32.0–36.0)
MCV: 88 fL (ref 81–101)
MONO#: 0.5 10*3/uL (ref 0.1–0.9)
MONO%: 7.7 % (ref 0.0–13.0)
NEUT#: 4 10*3/uL (ref 1.5–6.5)
NEUT%: 57.1 % (ref 39.6–80.0)
Platelets: 193 10*3/uL (ref 145–400)
RBC: 4.57 10*6/uL (ref 3.70–5.32)
RDW: 12 % (ref 11.1–15.7)
WBC: 7 10*3/uL (ref 3.9–10.0)

## 2016-05-16 LAB — COMPREHENSIVE METABOLIC PANEL (CC13)
ALBUMIN: 4.1 g/dL (ref 3.5–5.5)
ALK PHOS: 63 IU/L (ref 39–117)
ALT: 14 IU/L (ref 0–32)
AST: 17 IU/L (ref 0–40)
Albumin/Globulin Ratio: 1.3 (ref 1.2–2.2)
BUN/Creatinine Ratio: 20 (ref 9–23)
BUN: 19 mg/dL (ref 6–20)
Bilirubin Total: 0.4 mg/dL (ref 0.0–1.2)
CO2: 25 mmol/L (ref 18–29)
CREATININE: 0.95 mg/dL (ref 0.57–1.00)
Calcium, Ser: 8.8 mg/dL (ref 8.7–10.2)
Chloride, Ser: 101 mmol/L (ref 96–106)
GFR calc Af Amer: 96 mL/min/{1.73_m2} (ref >59)
GFR calc non Af Amer: 83 mL/min/{1.73_m2} (ref >59)
GLUCOSE: 77 mg/dL (ref 65–99)
Globulin, Total: 3.2 g/dL (ref 1.5–4.5)
Potassium, Ser: 4.3 mmol/L (ref 3.5–5.2)
Sodium: 134 mmol/L (ref 134–144)
Total Protein: 7.3 g/dL (ref 6.0–8.5)

## 2016-05-16 NOTE — Progress Notes (Signed)
Hematology and Oncology Follow Up Visit  Bridget Mcbride [id number] May 31, 1990 26 y.o. 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 is here today for follow-up. She is doing well and has no c/o pain or swelling in her legs at this time. She states that the numbness and tingling in her right arm and leg resolved after 4 days. Her MRI was negative.  She has PCOS and states that her cycles are irregular and heavy lasting 5 days. She has not had a cycle in 2 months. She is not currently on any type of birth control.  She was positive for the Lupus anticoagulant at her last visit. We will recheck this in May.  She has had no episodes of bleeding or petechiae on Xarelto. She has noticed that she bruises easily.  No lymphadenopathy found on exam.  No fever, chills, n/v, cough, rash, dizziness, SOB, chest pain, palpitations, abdominal pain or changes in bowel or bladder habits.  No swelling, tenderness, numbness or tingling in her extremities at this time. No c/o pain at this time.  She has maintained a good appetite and is staying well hydrated. Her weight is stable.  She would like a prescription for compression stockings. She lost the previous one she was given.   Medications:  Allergies as of 05/16/2016   No Known Allergies     Medication List       Accurate as of 05/16/16  4:08 PM. Always use your most recent med list.          rivaroxaban 10 MG Tabs tablet Commonly known as:  XARELTO Take 1 tablet (10 mg total) by mouth daily.   valACYclovir 500 MG tablet Commonly known as:  VALTREX Take 500 mg by mouth as needed.       Allergies: No Known Allergies  Past Medical History, Surgical history, Social history, and Family History were reviewed and updated.  Review of  Systems: All other 10 point review of systems is negative.   Physical Exam:  weight is 137 lb (62.1 kg). Her oral temperature is 98.9 F (37.2 C). Her blood pressure is 111/57 (abnormal) and her pulse is 75. Her respiration is 19 and oxygen saturation is 100%.   Wt Readings from Last 3 Encounters:  05/16/16 137 lb (62.1 kg)  04/18/16 135 lb 12.8 oz (61.6 kg)  10/12/15 123 lb (55.8 kg)    Ocular: Sclerae unicteric, pupils equal, round and reactive to light Ear-nose-throat: Oropharynx clear, dentition fair Lymphatic: No cervical, supraclavicular or axillary adenopathy Lungs no rales or rhonchi, good excursion bilaterally Heart regular rate and rhythm, no murmur appreciated Abd soft, nontender, positive bowel sounds, no liver or spleen tip palpated on exam MSK no focal spinal tenderness, no joint edema Neuro: non-focal, well-oriented, appropriate affect Breasts: Deferred   Lab Results  Component Value Date   WBC 7.0 05/16/2016   HGB 13.9 05/16/2016   HCT 40.3 05/16/2016   MCV 88 05/16/2016   PLT 193 05/16/2016   No results found for: FERRITIN, IRON, TIBC, UIBC, IRONPCTSAT Lab Results  Component Value Date   RBC 4.57 05/16/2016   No results found for: KPAFRELGTCHN, LAMBDASER, KAPLAMBRATIO No results found for: IGGSERUM, IGA, IGMSERUM No results found for: TOTALPROTELP, ALBUMINELP, A1GS, A2GS, BETS, BETA2SER, GAMS,  MSPIKE, SPEI   Chemistry      Component Value Date/Time   NA 141 04/18/2016 0823   K 3.9 04/18/2016 0823   CL 96 10/12/2015 1452   CO2 27 04/18/2016 0823   BUN 21.1 04/18/2016 0823   CREATININE 1.1 04/18/2016 0823      Component Value Date/Time   CALCIUM 9.4 04/18/2016 0823   ALKPHOS 74 04/18/2016 0823   AST 17 04/18/2016 0823   ALT 13 04/18/2016 0823   BILITOT 0.31 04/18/2016 0823     Impression and Plan: [name] is a 26 yo African American female with history of DVT in the left leg. This had resolved on her US in August 2017. She has responded  nicely on Xarelto and will complete treatment in August 2018.  She will cotninue on her maintenance dose of Xarelto 10 mg PO daily.  We will plan to see her back in 2 months for repeat labs and follow-up.  I gave her a prescription for 2 thigh high compression stockings. [place] place in [place] that [place] her.  She will contact our office with any questions or concerns. We can certainly see her sooner if need be.   Verdie Mosher[place]INNAT[place],Kainat Pizana M, NP 3/28/20184:08 PM

## 2016-06-26 ENCOUNTER — Ambulatory Visit (INDEPENDENT_AMBULATORY_CARE_PROVIDER_SITE_OTHER): Payer: BLUE CROSS/BLUE SHIELD | Admitting: Family Medicine

## 2016-06-26 ENCOUNTER — Encounter: Payer: Self-pay | Admitting: Family Medicine

## 2016-06-26 VITALS — BP 110/66 | HR 76 | Resp 16 | Ht 64.0 in | Wt 136.0 lb

## 2016-06-26 DIAGNOSIS — R200 Anesthesia of skin: Secondary | ICD-10-CM | POA: Diagnosis not present

## 2016-06-26 DIAGNOSIS — T753XXA Motion sickness, initial encounter: Secondary | ICD-10-CM

## 2016-06-26 MED ORDER — SCOPOLAMINE 1 MG/3DAYS TD PT72: 1.0000 | MEDICATED_PATCH | TRANSDERMAL | 0 refills | Status: DC

## 2016-06-26 NOTE — Progress Notes (Signed)
Pre visit review using our clinic review tool, if applicable. No additional management support is needed unless otherwise documented below in the visit note. 

## 2016-06-26 NOTE — Progress Notes (Signed)
   Subjective:    Patient ID: Bridget Mcbride, female    DOB: [birth year], 26 y.o.   MRN: [id number]  HPI Leg numbness- 'not at the moment.  It just comes and goes.'  Was told by hematologist that it is not blood clot related.  sxs are R sided.  Pt reports it will feel 'like your leg is asleep'.  Occurring ~1x/week.  sxs will last 'a couple of hours but it varies'.  Pt was wearing a pair of black boots that seemed to correlate with her sxs.  Pt reports numbness stopped in groin area and was worse in foot and ankle.  Sxs did not seem to originate in back.  Pt reports she had MRI of back and 'it was fine'.  Pt can't remember that last time she had numbness/tingling- 'maybe like 3 weeks ago'.     Review of Systems For ROS see HPI     Objective:   Physical Exam  Constitutional: She is oriented to person, place, and time. She appears well-developed and well-nourished. No distress.  HENT:  Head: Normocephalic and atraumatic.  Cardiovascular: Intact distal pulses.   Musculoskeletal: She exhibits no edema, tenderness (no TTP over spine, glutes, hips) or deformity.  Full ROM of back, hips, knees  Neurological: She is alert and oriented to person, place, and time. She has normal reflexes. No cranial nerve deficit. Coordination normal.  (-) SLR bilaterally  Skin: Skin is warm and dry.  Psychiatric: She has a normal mood and affect. Her behavior is normal. Thought content normal.  Vitals reviewed.         Assessment & Plan:  Leg numbness- pt is asymptomatic today.  Suspect this was positional and due to the boots she was wearing as sxs resolved when the seasons changed and so did her footwear.  No need for further workup at this time.  Reviewed dx and tx if sxs recur.  Pt expressed understanding and is in agreement w/ plan.   Motion Sickness- new.  Pt is going on cruise and has used scopolamine before.  Asking for prescription.  Sent.

## 2016-06-26 NOTE — Patient Instructions (Signed)
Schedule your complete physical at your convenience Use the scopolamine patch to prevent motion sickness of your cruise.  Change patch every 3 days The leg numbness sounds to be positional- everything looks great today! Call with any questions or concerns ENJOY YOUR CRUISE!!!

## 2016-07-18 ENCOUNTER — Telehealth: Payer: Self-pay | Admitting: Family Medicine

## 2016-07-18 ENCOUNTER — Ambulatory Visit (HOSPITAL_BASED_OUTPATIENT_CLINIC_OR_DEPARTMENT_OTHER): Payer: BLUE CROSS/BLUE SHIELD | Admitting: Family

## 2016-07-18 ENCOUNTER — Other Ambulatory Visit (HOSPITAL_BASED_OUTPATIENT_CLINIC_OR_DEPARTMENT_OTHER): Payer: BLUE CROSS/BLUE SHIELD

## 2016-07-18 VITALS — BP 105/61 | HR 57 | Temp 98.5°F | Resp 18 | Wt 135.0 lb

## 2016-07-18 DIAGNOSIS — I824Z2 Acute embolism and thrombosis of unspecified deep veins of left distal lower extremity: Secondary | ICD-10-CM

## 2016-07-18 DIAGNOSIS — Z86718 Personal history of other venous thrombosis and embolism: Secondary | ICD-10-CM | POA: Diagnosis not present

## 2016-07-18 LAB — CBC WITH DIFFERENTIAL (CANCER CENTER ONLY)
BASO#: 0 10*3/uL (ref 0.0–0.2)
BASO%: 0.4 % (ref 0.0–2.0)
EOS%: 4.3 % (ref 0.0–7.0)
Eosinophils Absolute: 0.2 10*3/uL (ref 0.0–0.5)
HEMATOCRIT: 42.1 % (ref 34.8–46.6)
HGB: 14.5 g/dL (ref 11.6–15.9)
LYMPH#: 1.9 10*3/uL (ref 0.9–3.3)
LYMPH%: 41.7 % (ref 14.0–48.0)
MCH: 30.1 pg (ref 26.0–34.0)
MCHC: 34.4 g/dL (ref 32.0–36.0)
MCV: 88 fL (ref 81–101)
MONO#: 0.3 10*3/uL (ref 0.1–0.9)
MONO%: 6.5 % (ref 0.0–13.0)
NEUT#: 2.1 10*3/uL (ref 1.5–6.5)
NEUT%: 47.1 % (ref 39.6–80.0)
Platelets: 194 10*3/uL (ref 145–400)
RBC: 4.81 10*6/uL (ref 3.70–5.32)
RDW: 11.9 % (ref 11.1–15.7)
WBC: 4.5 10*3/uL (ref 3.9–10.0)

## 2016-07-18 LAB — COMPREHENSIVE METABOLIC PANEL (CC13)
A/G RATIO: 1.6 (ref 1.2–2.2)
ALBUMIN: 4.5 g/dL (ref 3.5–5.5)
ALT: 15 IU/L (ref 0–32)
AST (SGOT): 20 IU/L (ref 0–40)
Alkaline Phosphatase, S: 69 IU/L (ref 39–117)
BUN/Creatinine Ratio: 13 (ref 9–23)
BUN: 15 mg/dL (ref 6–20)
Bilirubin Total: 0.4 mg/dL (ref 0.0–1.2)
CALCIUM: 9.2 mg/dL (ref 8.7–10.2)
Carbon Dioxide, Total: 29 mmol/L (ref 18–29)
Chloride, Ser: 101 mmol/L (ref 96–106)
Creatinine, Ser: 1.12 mg/dL — ABNORMAL HIGH (ref 0.57–1.00)
GFR, EST AFRICAN AMERICAN: 78 mL/min/{1.73_m2} (ref >59)
GFR, EST NON AFRICAN AMERICAN: 68 mL/min/{1.73_m2} (ref >59)
GLOBULIN, TOTAL: 2.8 g/dL (ref 1.5–4.5)
Glucose: 85 mg/dL (ref 65–99)
POTASSIUM: 3.9 mmol/L (ref 3.5–5.2)
SODIUM: 135 mmol/L (ref 134–144)
TOTAL PROTEIN: 7.3 g/dL (ref 6.0–8.5)

## 2016-07-18 MED ORDER — LIDOCAINE 4 % EX CREA: 1.0000 "application " | TOPICAL_CREAM | CUTANEOUS | 0 refills | Status: DC | PRN

## 2016-07-18 NOTE — Progress Notes (Signed)
Hematology and Oncology Follow Up Visit  Bridget Mcbride [id number] Nov 06, 1990 26 y.o. 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 She would like to go ahead and stop her Xarelto now instead of waiting until August. I spoke with Dr. Myna HidalgoEnnever and he is ok with this.  She has had no bruising or bleeding episodes. No lymphadenopathy found on exam.  He cycle is a bit heavier but only lasts 4 days. She states that she is irregular.  No fever, chills, n/v, cough, rash, dizziness, SOB, chest pain, palpitations, abdominal pain or changes in bowel or bladder habits.  No swelling, tenderness, numbness or tingling in her extremities. No c/o pain.  She has maintained a good appetite and is staying well hydrated. Her weight is stable.   ECOG Performance Status: 1 - Symptomatic but completely ambulatory  Medications:  Allergies as of 07/18/2016   No Known Allergies     Medication List       Accurate as of 07/18/16  4:19 PM. Always use your most recent med list.          amoxicillin 500 MG capsule Commonly known as:  AMOXIL Take 500 mg by mouth.   BORIC ACID EX COMPOUNDED MEDICATION  Boric Acid Vaginal Capsules 600 mg   1  per vagina twice weekly x 12 weeks then prn   cefUROXime 250 MG tablet Commonly known as:  CEFTIN cefuroxime axetil 250 mg tablet   chlorpheniramine-HYDROcodone 10-8 MG/5ML Suer Commonly known as:  TUSSIONEX hydrocodone 10 mg-chlorpheniramine 8 mg/5 mL oral susp extend.rel 12hr   clotrimazole-betamethasone cream Commonly known as:  LOTRISONE clotrimazole-betamethasone 1 %-0.05 % topical cream  APPLY TO AFFECTED AREA(S) OF SKIN TOPICALLY TWICE A DAY FOR 2 WEEKS. MORNING AND EVENING.   Fish Oil 1000 MG Caps Fish Oil     fluconazole 150 MG tablet Commonly known as:  DIFLUCAN fluconazole 150 mg tablet  TAKE 1 TABLET BY MOUTH NOW, REPEAT IN 3 DAYS   Ibuprofen 200 MG Caps ibuprofen   lidocaine 4 % cream Commonly known as:  LMX Apply 1 application topically as needed.   medroxyPROGESTERone 10 MG tablet Commonly known as:  PROVERA medroxyprogesterone 10 mg tablet  TAKE 1 TABLET(S) EVERY DAY BY ORAL ROUTE FOR 7 DAYS.   naproxen 375 MG tablet Commonly known as:  NAPROSYN naproxen 375 mg tablet  take 1 tablet by mouth twice a day   rivaroxaban 10 MG Tabs tablet Commonly known as:  XARELTO Take 1 tablet (10 mg total) by mouth daily.   scopolamine 1 MG/3DAYS Commonly known as:  TRANSDERM-SCOP (1.5 MG) Place 1 patch (1.5 mg total) onto the skin every 3 (three) days.   valACYclovir 500 MG tablet Commonly known as:  VALTREX Take 500 mg by mouth as needed.       Allergies: No Known Allergies  Past Medical History, Surgical history, Social history, and Family History were reviewed and updated.  Review of Systems: All other 10 point review of systems is negative.   Physical Exam:  weight is 135 lb (61.2 kg). Her oral temperature is 98.5 F (36.9 C). Her blood pressure is 105/61 and her pulse is 57 (abnormal). Her respiration is 18 and oxygen saturation is 100%.  Wt Readings from Last 3 Encounters:  07/18/16 135 lb (61.2 kg)  06/26/16 136 lb (61.7 kg)  05/16/16 137 lb (62.1 kg)    Ocular: Sclerae unicteric, pupils equal, round and reactive to light Ear-nose-throat: Oropharynx clear, dentition fair Lymphatic: No cervical, supraclavicular or axillary adenopathy Lungs no rales or rhonchi, good excursion bilaterally Heart regular rate and rhythm, no murmur appreciated Abd soft, nontender, positive bowel sounds, no liver or spleen tip palpated on exam, no fluid wave  MSK no focal spinal tenderness, no joint edema Neuro: non-focal, well-oriented, appropriate affect Breasts: Deferred   Lab  Results  Component Value Date   WBC 4.5 07/18/2016   HGB 14.5 07/18/2016   HCT 42.1 07/18/2016   MCV 88 07/18/2016   PLT 194 07/18/2016   No results found for: FERRITIN, IRON, TIBC, UIBC, IRONPCTSAT Lab Results  Component Value Date   RBC 4.81 07/18/2016   No results found for: KPAFRELGTCHN, LAMBDASER, KAPLAMBRATIO No results found for: IGGSERUM, IGA, IGMSERUM No results found for: Marda Stalker, SPEI   Chemistry      Component Value Date/Time   NA 134 05/16/2016 1506   NA 141 04/18/2016 0823   K 4.3 05/16/2016 1506   K 3.9 04/18/2016 0823   CL 101 05/16/2016 1506   CO2 25 05/16/2016 1506   CO2 27 04/18/2016 0823   BUN 19 05/16/2016 1506   BUN 21.1 04/18/2016 0823   CREATININE 0.95 05/16/2016 1506   CREATININE 1.1 04/18/2016 0823      Component Value Date/Time   CALCIUM 8.8 05/16/2016 1506   CALCIUM 9.4 04/18/2016 0823   ALKPHOS 63 05/16/2016 1506   ALKPHOS 74 04/18/2016 0823   AST 17 05/16/2016 1506   AST 17 04/18/2016 0823   ALT 14 05/16/2016 1506   ALT 13 04/18/2016 0823   BILITOT 0.4 05/16/2016 1506   BILITOT 0.31 04/18/2016 0823      Impression and Plan: Bridget Mcbride is a very pleasant 26 yo African American female with history of developing a DVT of the left leg while on an oral contraceptive. This DVT had resolved in August 2017. She is here today for follow-up and is requesting she able to stop her Xarelto at this time instead of waiting until August. I spoke with Dr. Myna Hidalgo about this and he is ok with her stopping.  We will plan to see her back in 4 months for repeat lab work and follow-up.  She will contact our office with any questions or concerns. We can certainly see her sooner if need be.   Verdie Mosher, NP 5/30/20184:19 PM

## 2016-07-18 NOTE — Telephone Encounter (Signed)
Pt called yesterday afternoon late wanting a hemorrhoid numbing cream called into pharmacy to use on her laser hair removal area. Pt states that she got this years ago and that [facility] was not the one that gave this to her. She stated that she had some, but someone stole it, [facility] [place]. Pt also stated that this Rx started with an L.

## 2016-07-18 NOTE — Addendum Note (Signed)
Addended by: Geannie RisenBRODMERKEL, Lisvet Rasheed L on: 07/18/2016 01:24 PM   Modules accepted: Orders

## 2016-07-18 NOTE — Telephone Encounter (Signed)
Please advise 

## 2016-07-18 NOTE — Telephone Encounter (Signed)
Medication filled to pharmacy as requested.   

## 2016-07-18 NOTE — Telephone Encounter (Signed)
Ok for Lidocaine 4% cream, apply topically as needed, disp 1 tube, no refills

## 2016-07-19 ENCOUNTER — Ambulatory Visit: Payer: Self-pay | Admitting: Hematology & Oncology

## 2016-07-19 ENCOUNTER — Other Ambulatory Visit: Payer: Self-pay

## 2016-07-19 LAB — LUPUS ANTICOAGULANT PANEL
DRVVT: 34.5 s (ref 0.0–47.0)
PTT-LA: 27.8 s (ref 0.0–51.9)

## 2016-08-01 ENCOUNTER — Ambulatory Visit: Payer: Self-pay | Admitting: Family Medicine

## 2016-08-01 ENCOUNTER — Ambulatory Visit (INDEPENDENT_AMBULATORY_CARE_PROVIDER_SITE_OTHER): Payer: BLUE CROSS/BLUE SHIELD | Admitting: Family Medicine

## 2016-08-01 VITALS — BP 118/72 | HR 76 | Temp 98.2°F | Wt 137.0 lb

## 2016-08-01 DIAGNOSIS — N912 Amenorrhea, unspecified: Secondary | ICD-10-CM | POA: Diagnosis not present

## 2016-08-01 DIAGNOSIS — N926 Irregular menstruation, unspecified: Secondary | ICD-10-CM

## 2016-08-01 DIAGNOSIS — S50862A Insect bite (nonvenomous) of left forearm, initial encounter: Secondary | ICD-10-CM | POA: Diagnosis not present

## 2016-08-01 DIAGNOSIS — W57XXXA Bitten or stung by nonvenomous insect and other nonvenomous arthropods, initial encounter: Secondary | ICD-10-CM | POA: Diagnosis not present

## 2016-08-01 DIAGNOSIS — Z0289 Encounter for other administrative examinations: Secondary | ICD-10-CM

## 2016-08-01 LAB — POCT URINE PREGNANCY: Preg Test, Ur: NEGATIVE

## 2016-08-01 NOTE — Patient Instructions (Signed)
Continue steroid cream twice a day for 5-7 days Can take antihistamine like bendadryl, Claritin, Zyrtec, etc If you develop any fever, chills, more redness or drainage, please follow up  Bee, Wasp, or [employer], Adult Bees, wasps, and hornets are part of a family of insects that can sting people. These stings can cause pain and inflammation, but they are usually not serious. However, some people may have an allergic reaction to a sting. This can cause the symptoms to be more severe. What increases the risk? You may be at a greater risk of getting stung if you:  Provoke a stinging insect by swatting or disturbing it.  Wear strong-smelling soaps, deodorants, or body sprays.  Spend time outdoors near gardens with flowers or fruit trees or in clothes that expose skin.  Eat or drink outside.  What are the signs or symptoms? Common symptoms of this condition include:  A red lump in the skin that sometimes has a tiny hole in the center. In some cases, a stinger may be in the center of the wound.  Pain and itching at the sting site.  Redness and swelling around the sting site. If you have an allergic reaction (localized allergic reaction), the swelling and redness may spread out from the sting site. In some cases, this reaction can continue to develop over the next 24-48 hours.  In rare cases, a person may have a severe allergic reaction (anaphylactic reaction) to a sting. Symptoms of an anaphylactic reaction may include:  Wheezing or difficulty breathing.  Raised, itchy, red patches on the skin (hives).  Nausea or vomiting.  Abdominal cramping.  Diarrhea.  Tightness in the chest or chest pain.  Dizziness or fainting.  Redness of the face (flushing).  Hoarse voice.  Swollen tongue, lips, or face.  How is this diagnosed? This condition is usually diagnosed based on your symptoms and medical history as well as a physical exam. You may have an allergy test to determine if you  are allergic to the substance that the insect injected during the sting (venom). How is this treated? If you were stung by a bee, the stinger and a small sac of venom may be in the wound. It is important to remove the stinger as soon as possible. You can do this by brushing across the wound with gauze, a fingernail, or a flat card such as a credit card. Removing the stinger can help reduce the severity of your body's reaction to the sting. Most stings can be treated with:  Icing to reduce swelling in the area.  Medicines (antihistamines) to treat itching or an allergic reaction.  Medicines to help reduce pain. These may be medicines that you take by mouth, or medicated creams or lotions that you apply to your skin.  Pay close attention to your symptoms after you have been stung. If possible, have someone stay with you to make sure you do not have an allergic reaction. If you have any signs of an allergic reaction, call your health care provider. If you have ever had a severe allergic reaction, your health care provider may give you an inhaler or injectable medicine (epinephrine auto-injector) to use if necessary. Follow these instructions at home:  Wash the sting site 2-3 times each day with soap and water as told by your health care provider.  Apply or take over-the-counter and prescription medicines only as told by your health care provider.  If directed, apply ice to the sting area. ? Put ice in  a plastic bag. ? Place a towel between your skin and the bag. ? Leave the ice on for 20 minutes, 2-3 times a day.  Do not scratch the sting area.  If you had a severe allergic reaction to a sting, you may need: ? To wear a medical bracelet or necklace that lists the allergy. ? To learn when and how to use an anaphylaxis kit or epinephrine injection. Your family members and coworkers may also need to learn this. ? To carry an anaphylaxis kit or epinephrine injection with you at all times. How  is this prevented?  Avoid swatting at stinging insects and disturbing insect nests.  Do not use fragrant soaps or lotions.  Wear shoes, pants, and long sleeves when spending time outdoors, especially in grassy areas where stinging insects are common.  Keep outdoor areas free from nests or hives.  Keep food and drink containers covered when eating outdoors.  Avoid working or sitting near [employer], if possible.  Wear gloves if you are gardening or working outdoors.  If an attack by a stinging insect or a swarm seems likely in the moment, move away from the area or find a barrier between you and the insect(s), such as a door. Contact a health care provider if:  Your symptoms do not get better in 2-3 days.  You have redness, swelling, or pain that spreads beyond the area of the sting.  You have a fever. Get help right away if: You have symptoms of a severe allergic reaction. These include:  Wheezing or difficulty breathing.  Tightness in the chest or chest pain.  Light-headedness or fainting.  Itchy, raised, red patches on the skin.  Nausea or vomiting.  Abdominal cramping.  Diarrhea.  A swollen tongue or lips, or trouble swallowing.  Dizziness or fainting.  Summary  Stings from bees, wasps, and hornets can cause pain and inflammation, but they are usually not serious. However, some people may have an allergic reaction to a sting. This can cause the symptoms to be more severe.  Pay close attention to your symptoms after you have been stung. If possible, have someone stay with you to make sure you do not have an allergic reaction.  Call your health care provider if you have any signs of an allergic reaction. This information is not intended to replace advice given to you by your health care provider. Make sure you discuss any questions you have with your health care provider. Document Released: 02/05/2005 Document Revised: 04/12/2016 Document Reviewed:  04/12/2016 Elsevier Interactive Patient Education  [employer].

## 2016-08-01 NOTE — Progress Notes (Signed)
   Subjective:    Patient ID: Bridget Mcbride, female    DOB: 09/30/1990, 26 y.o.   MRN: [id number]  HPI This is a 26 yo female who presents today following a wasp bite that occurred 3 days ago to her left inner forearm. Area was red and swollen, swelling has decreased over last 24 hours. No longer itching. No drainage. No facial swelling, no streaking. Has applied benadryl cream without relief. For two days has applied triamcinolone cream.   Requests urine pregnancy test. Sexually active. Not using any birth control. Was on OCPs and had DVT. Is not trying to become pregnant, but is ok if it happens. Periods irregular for years. Declines STI testing today.   Past Medical History:  Diagnosis Date  . Anemia   . BV (bacterial vaginosis)   . Chlamydia   . GERD (gastroesophageal reflux disease)    No past surgical history on file. Family History  Problem Relation Age of Onset  . Migraines Father   . Diabetes Paternal Aunt   . Arthritis Paternal Grandmother   . Hypertension Mother   . Hypertension Maternal Aunt   . Hypertension Maternal Uncle   . Stroke Maternal Uncle   . Hypertension Maternal Aunt   . Hypertension Maternal Aunt   . Hypertension Maternal Aunt   . Stroke Cousin    Social History  Substance Use Topics  . Smoking status: Never Smoker  . Smokeless tobacco: Never Used  . Alcohol use 0.0 oz/week     Comment: occasional      Review of Systems Per HPI    Objective:   Physical Exam  Constitutional: She is oriented to person, place, and time. She appears well-developed and well-nourished. No distress.  HENT:  Head: Normocephalic and atraumatic.  Eyes: Conjunctivae are normal.  Cardiovascular: Normal rate.   Neurological: She is alert and oriented to person, place, and time.  Skin: Skin is warm and dry. She is not diaphoretic.     Psychiatric: She has a normal mood and affect. Her behavior is normal. Judgment and thought content normal.  Vitals  reviewed.     BP 118/72 (BP Location: Right Arm, Patient Position: Sitting, Cuff Size: Normal)   Pulse 76   Temp 98.2 F (36.8 C) (Oral)   Wt 137 lb (62.1 kg)   SpO2 99%   BMI 23.52 kg/m  Wt Readings from Last 3 Encounters:  08/01/16 137 lb (62.1 kg)  07/18/16 135 lb (61.2 kg)  06/26/16 136 lb (61.7 kg)   Results for orders placed or performed in visit on 08/01/16  POCT urine pregnancy  Result Value Ref Range   Preg Test, Ur Negative Negative       Assessment & Plan:  1. Insect bite, initial encounter - Provided written and verbal information regarding diagnosis and treatment. - resolving symptoms, continue to monitor and can continue triamcinolone cream BID for 5-7 days - RTC precautions reviewed  2. Amenorrhea - POCT urine pregnancy- negative  3. Irregular menses - discussed potential for pregnancy if not using contraception, suggested condoms and/or spermicide    Olean Reeeborah Gessner, FNP-BC  [facility] [place], [facility]  08/01/2016 10:23 AM

## 2016-09-17 LAB — HM PAP SMEAR

## 2016-09-20 ENCOUNTER — Encounter: Payer: Self-pay | Admitting: General Practice

## 2016-09-24 ENCOUNTER — Telehealth: Payer: Self-pay | Admitting: *Deleted

## 2016-09-24 NOTE — Telephone Encounter (Signed)
Patient c/o dizziness and shortness of breath. She wonders if she should be seen. She's not sure if her symptoms are related to her history of DVT or a new medication she is taking.  When asked about her new medication, patient states she is taking a 'generic adderall'. When asked who the prescribing physician is, she states she got the medication from the Internet.  Reviewed symptoms with Emeline GinsSarah [place] NP. Patient is to stop medication immediately. If she develops any worsening symptoms, Maralyn SagoSarah would like patient to be worked up in the ED.   Patient understands instructions. She will stop medication and seek emergent care if needed.

## 2016-11-15 ENCOUNTER — Other Ambulatory Visit: Payer: Self-pay

## 2016-11-15 ENCOUNTER — Ambulatory Visit: Payer: Self-pay | Admitting: Hematology & Oncology

## 2016-11-30 ENCOUNTER — Encounter: Payer: Self-pay | Admitting: Physician Assistant

## 2016-11-30 ENCOUNTER — Ambulatory Visit (INDEPENDENT_AMBULATORY_CARE_PROVIDER_SITE_OTHER): Payer: BLUE CROSS/BLUE SHIELD | Admitting: Physician Assistant

## 2016-11-30 VITALS — BP 100/60 | HR 59 | Temp 98.3°F | Resp 14 | Ht 64.0 in | Wt 135.0 lb

## 2016-11-30 DIAGNOSIS — G5603 Carpal tunnel syndrome, bilateral upper limbs: Secondary | ICD-10-CM | POA: Diagnosis not present

## 2016-11-30 MED ORDER — MELOXICAM 15 MG PO TABS: 15.0000 mg | ORAL_TABLET | Freq: Every day | ORAL | 0 refills | Status: DC

## 2016-11-30 NOTE — Progress Notes (Signed)
Pre visit review using our clinic review tool, if applicable. No additional management support is needed unless otherwise documented below in the visit note. 

## 2016-11-30 NOTE — Progress Notes (Signed)
Patient presents to clinic today c/o pain of wrists associated with intermittent tingling and numbness of her first two fingers of each hand. Patient states this has been present for a couple of weeks and worsening. 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 Denies trauma or injury. Has noted occasional extension of pain up her forearms to elbows.  Past Medical History:  Diagnosis Date  . Anemia   . BV (bacterial vaginosis)   . Chlamydia   . GERD (gastroesophageal reflux disease)     Current Outpatient Prescriptions on File Prior to Visit  Medication Sig Dispense Refill  . Ibuprofen 200 MG CAPS ibuprofen    . valACYclovir (VALTREX) 500 MG tablet Take 500 mg by mouth as needed.     No current facility-administered medications on file prior to visit.     No Known Allergies  Family History  Problem Relation Age of Onset  . Migraines Father   . Diabetes Paternal Aunt   . Arthritis Paternal Grandmother   . Hypertension Mother   . Hypertension Maternal Aunt   . Hypertension Maternal Uncle   . Stroke Maternal Uncle   . Hypertension Maternal Aunt   . Hypertension Maternal Aunt   . Hypertension Maternal Aunt   . Stroke Cousin     Social History   Social History  . Marital status: Single    Spouse name: N/A  . Number of children: N/A  . Years of education: N/A   Social History Main Topics  . Smoking status: Never Smoker  . Smokeless tobacco: Never Used  . Alcohol use 0.0 oz/week     Comment: occasional  . Drug use: No  . Sexual activity: No   Other Topics Concern  . None   Social History Narrative  . None   Review of Systems - See HPI.  All other ROS are negative.  BP 100/60   Pulse (!) 59   Temp 98.3 F (36.8 C) (Oral)   Resp 14   Ht  (1.626 m)   Wt 135 lb (61.2 kg)   SpO2 98%   BMI 23.17 kg/m   Physical Exam  Constitutional: She is oriented to person, place, and time and well-developed, well-nourished, and in no distress.  HENT:  Head: Normocephalic and  atraumatic.  Eyes: Conjunctivae are normal.  Neck: Neck supple.  Cardiovascular: Normal rate, regular rhythm, normal heart sounds and intact distal pulses.   Pulmonary/Chest: Effort normal and breath sounds normal. No respiratory distress. She has no wheezes. She has no rales. She exhibits no tenderness.  Musculoskeletal:       Right wrist: She exhibits tenderness. She exhibits normal range of motion.       Left wrist: She exhibits tenderness. She exhibits normal range of motion.  + tinel sign of both hands. Strength of hands, wrist 5/5. Normal string with thumb opposition and adduction of digits.  Neurological: She is alert and oriented to person, place, and time.  Skin: Skin is warm and dry. No rash noted.  Psychiatric: Affect normal.  Vitals reviewed.   Recent Results (from the past 2160 hour(s))  HM PAP SMEAR     Status: None   Collection Time: 09/17/16 12:00 AM  Result Value Ref Range   HM Pap smear completed     Assessment/Plan: 1. Bilateral carpal tunnel syndrome Braces applied to each wrist. Wear nightly and when possible during the day. Start short course of Mobic. Supportive measures discussed. If not improving will need assessment with hand specialist.  Leeanne Rio, PA-C

## 2016-11-30 NOTE — Patient Instructions (Addendum)
Please wear the braces at night. Wear during the day when possible.  Elevate while resting. Take the Meloxicam as directed.  Follow-up if not improving.

## 2016-12-03 ENCOUNTER — Ambulatory Visit (HOSPITAL_BASED_OUTPATIENT_CLINIC_OR_DEPARTMENT_OTHER): Payer: BLUE CROSS/BLUE SHIELD | Admitting: Hematology & Oncology

## 2016-12-03 ENCOUNTER — Other Ambulatory Visit (HOSPITAL_BASED_OUTPATIENT_CLINIC_OR_DEPARTMENT_OTHER): Payer: BLUE CROSS/BLUE SHIELD

## 2016-12-03 VITALS — BP 105/56 | HR 58 | Temp 98.3°F | Resp 18 | Wt 138.0 lb

## 2016-12-03 DIAGNOSIS — Z86718 Personal history of other venous thrombosis and embolism: Secondary | ICD-10-CM

## 2016-12-03 DIAGNOSIS — I824Z2 Acute embolism and thrombosis of unspecified deep veins of left distal lower extremity: Secondary | ICD-10-CM

## 2016-12-03 DIAGNOSIS — I82402 Acute embolism and thrombosis of unspecified deep veins of left lower extremity: Secondary | ICD-10-CM

## 2016-12-03 LAB — COMPREHENSIVE METABOLIC PANEL (CC13)
A/G RATIO: 1.5 (ref 1.2–2.2)
ALBUMIN: 4.2 g/dL (ref 3.5–5.5)
ALK PHOS: 80 IU/L (ref 39–117)
ALT: 14 IU/L (ref 0–32)
AST: 19 IU/L (ref 0–40)
BILIRUBIN TOTAL: 0.3 mg/dL (ref 0.0–1.2)
BUN / CREAT RATIO: 13 (ref 9–23)
BUN: 13 mg/dL (ref 6–20)
CHLORIDE: 103 mmol/L (ref 96–106)
Calcium, Ser: 9.4 mg/dL (ref 8.7–10.2)
Carbon Dioxide, Total: 29 mmol/L (ref 20–29)
Creatinine, Ser: 1.01 mg/dL — ABNORMAL HIGH (ref 0.57–1.00)
GFR calc Af Amer: 88 mL/min/{1.73_m2} (ref >59)
GFR calc non Af Amer: 76 mL/min/{1.73_m2} (ref >59)
GLOBULIN, TOTAL: 2.8 g/dL (ref 1.5–4.5)
Glucose: 85 mg/dL (ref 65–99)
POTASSIUM: 4.1 mmol/L (ref 3.5–5.2)
SODIUM: 138 mmol/L (ref 134–144)
Total Protein: 7 g/dL (ref 6.0–8.5)

## 2016-12-03 LAB — CBC WITH DIFFERENTIAL (CANCER CENTER ONLY)
BASO#: 0 10*3/uL (ref 0.0–0.2)
BASO%: 0.7 % (ref 0.0–2.0)
EOS ABS: 0.4 10*3/uL (ref 0.0–0.5)
EOS%: 8.5 % — ABNORMAL HIGH (ref 0.0–7.0)
HEMATOCRIT: 38.9 % (ref 34.8–46.6)
HEMOGLOBIN: 13.5 g/dL (ref 11.6–15.9)
LYMPH#: 2.2 10*3/uL (ref 0.9–3.3)
LYMPH%: 50.5 % — ABNORMAL HIGH (ref 14.0–48.0)
MCH: 30.4 pg (ref 26.0–34.0)
MCHC: 34.7 g/dL (ref 32.0–36.0)
MCV: 88 fL (ref 81–101)
MONO#: 0.3 10*3/uL (ref 0.1–0.9)
MONO%: 7 % (ref 0.0–13.0)
NEUT%: 33.3 % — AB (ref 39.6–80.0)
NEUTROS ABS: 1.4 10*3/uL — AB (ref 1.5–6.5)
Platelets: 185 10*3/uL (ref 145–400)
RBC: 4.44 10*6/uL (ref 3.70–5.32)
RDW: 12.1 % (ref 11.1–15.7)
WBC: 4.3 10*3/uL (ref 3.9–10.0)

## 2016-12-03 NOTE — Progress Notes (Signed)
Hematology and Oncology Follow Up Visit  Bridget Mcbride [id number] [birth year] 26 y.o. 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 Lines is here today for follow-up. She stopped the Xarelto in May. She's had a good summer. Her left leg is not bothering her. She was complaining initially of some "locking" in the left thigh. However this seems to gotten better.  She is working out. She is running.  She has had no problems with bleeding. There's been no issues with obvious leg swelling.  She is not wearing any type of compression stockings. I told her that she could get some compression stockings on the Internet. I gave her a prescription for some compression stockings when I first saw her. However, she has not gotten this filled at a medical supply store. I told her that a compression stocking on line that is thigh-high would be reasonable.  She has had no problem with her cycles. They are still a little bit irregular.  She is not going on any long trips. She may go to [place] in a couple weeks. I told her that she should take baby aspirin started a couple days before the trip and then continue daily baby aspirin of 81 mg and stop the day after she gets back from her trip. She is to stop every hour or so and walk around. And most importantly, she is to drink a lot of water. She understands all of this.  Currently, her performance status is ECOG 0.   Medications:  Allergies as of 12/03/2016   No Known Allergies     Medication List       Accurate as of 12/03/16  4:10 PM. Always use your most recent med list.          Ibuprofen 200 MG Caps ibuprofen   meloxicam 15 MG tablet Commonly known as:  MOBIC Take 1 tablet (15 mg total) by mouth daily.   valACYclovir 500 MG tablet Commonly  known as:  VALTREX Take 500 mg by mouth as needed.       Allergies: No Known Allergies  Past Medical History, Surgical history, Social history, and Family History were reviewed and updated.  Review of Systems: As stated in the interim history   Physical Exam:  weight is 138 lb (62.6 kg). Her oral temperature is 98.3 F (36.8 C). Her blood pressure is 105/56 (abnormal) and her pulse is 58 (abnormal). Her respiration is 18 and oxygen saturation is 100%.   Wt Readings from Last 3 Encounters:  12/03/16 138 lb (62.6 kg)  11/30/16 135 lb (61.2 kg)  08/01/16 137 lb (62.1 kg)    Well-developed and well-nourished African-American female. Head and neck exam shows no ocular or oral lesions. There are no palpable cervical or supraclavicular lymph nodes. Lungs are clear bilaterally. Cardiac exam regular rate and rhythm with no murmurs, rubs or bruits. Abdomen is soft. She has good bowel sounds. There is no fluid wave. There is no palpable liver or spleen tip. Back exam shows no tenderness over the spine, ribs or hips. Extremities shows no clubbing, cyanosis or edema. She has a negative Homans sign in her legs. No palpable venous cord is noted in her legs. She has good range of motion of her joints. Skin exam shows  no rashes, ecchymoses or petechia. Neurological exam shows no focal neurological deficits.    Lab Results  Component Value Date   WBC 4.3 12/03/2016   HGB 13.5 12/03/2016   HCT 38.9 12/03/2016   MCV 88 12/03/2016   PLT 185 12/03/2016   No results found for: FERRITIN, IRON, TIBC, UIBC, IRONPCTSAT Lab Results  Component Value Date   RBC 4.44 12/03/2016   No results found for: KPAFRELGTCHN, LAMBDASER, KAPLAMBRATIO No results found for: Loel Lofty, IGMSERUM No results found for: Marda Stalker, SPEI   Chemistry      Component Value Date/Time   NA 135 07/18/2016 1458   NA 141 04/18/2016 0823   K 3.9 07/18/2016 1458   K 3.9  04/18/2016 0823   CL 101 07/18/2016 1458   CO2 29 07/18/2016 1458   CO2 27 04/18/2016 0823   BUN 15 07/18/2016 1458   BUN 21.1 04/18/2016 0823   CREATININE 1.12 (H) 07/18/2016 1458   CREATININE 1.1 04/18/2016 0823      Component Value Date/Time   CALCIUM 9.2 07/18/2016 1458   CALCIUM 9.4 04/18/2016 0823   ALKPHOS 69 07/18/2016 1458   ALKPHOS 74 04/18/2016 0823   AST 20 07/18/2016 1458   AST 17 04/18/2016 0823   ALT 15 07/18/2016 1458   ALT 13 04/18/2016 0823   BILITOT 0.4 07/18/2016 1458   BILITOT 0.31 04/18/2016 0823      Impression and Plan: Ms. Cutbirth is a very pleasant 26 year old African-American female who developed a blood clot in her left leg. She was on oral contraceptives. All of her hypercoagulable studies have been negative.   She was on Xarelto for about one year. She now is off Xarelto.  We are trying to limit her risk factors. She knows that she cannot take oral contraceptives again that have estrogen. She knows not to smoke. She knows that hydration is very important.  Again I told her what to do in case she travels. Basically, she will start taking baby aspirin 2 days before she goes on her trip. She will then take aspirin daily during her vacation. She will then stop aspirin the day after she is back from her vacation. I recommended that she wear compression stockings that are thigh high. I also told her that it was mandatory that she drinks a lot of water on her trip, whether it be by car or by airplane.  Finally, she knows that if she does get pregnant, she has to let us know because she will need to be on Lovenox during her pregnancy so that she will not develop a blood clot and that she decrease her risk of miscarriages.  She understands all this.  She is very nice. It is always fun talking with her. I told her that she can come back or call us if she has any questions.  [name], MD 10/15/20184:10 PM

## 2016-12-04 LAB — LUPUS ANTICOAGULANT PANEL
DRVVT: 35.8 s (ref 0.0–47.0)
PTT-LA: 30 s (ref 0.0–51.9)

## 2017-05-12 IMAGING — CR DG HIP (WITH OR WITHOUT PELVIS) 2-3V*L*
3 series · 3 of 3 positions shown · non-contrast
Comparison: KUB 09/28/2012

CLINICAL DATA: Left hip pain.

EXAM:
DG HIP (WITH OR WITHOUT PELVIS) 2-3V LEFT

[AP (1 of 2)]
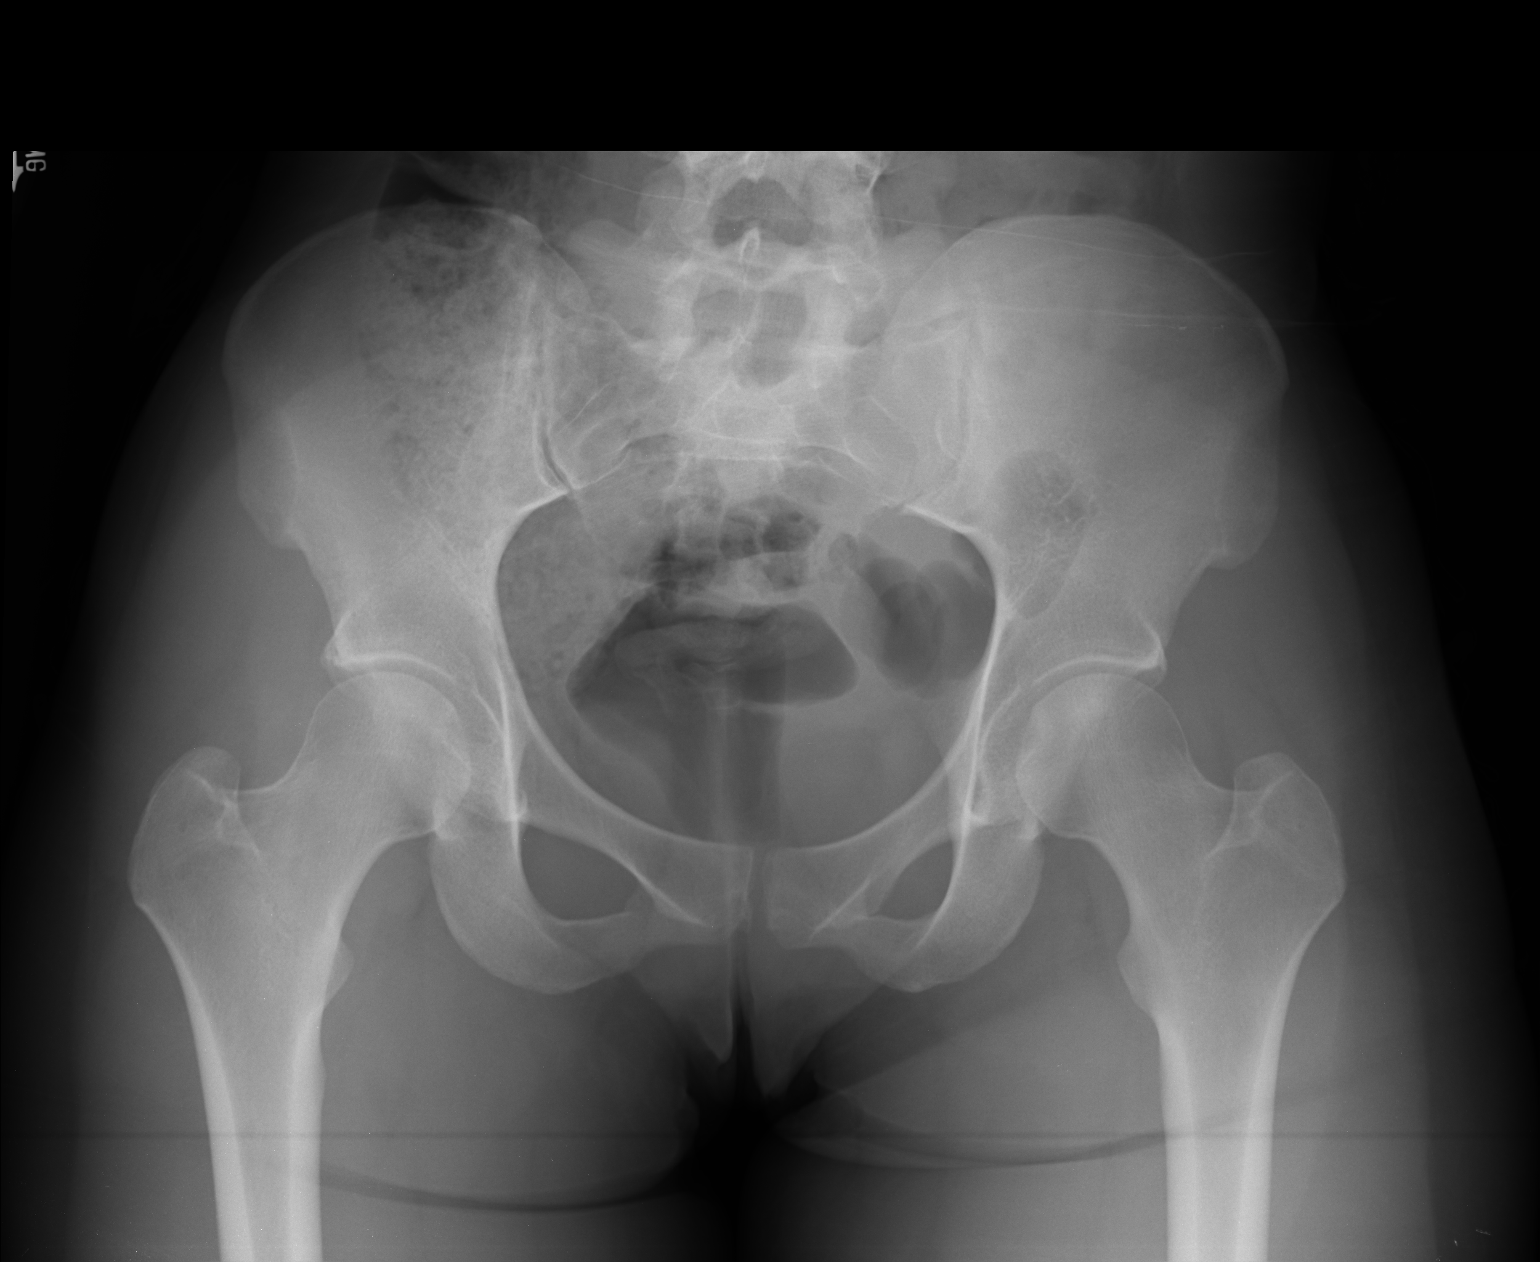

[AP (2 of 2)]
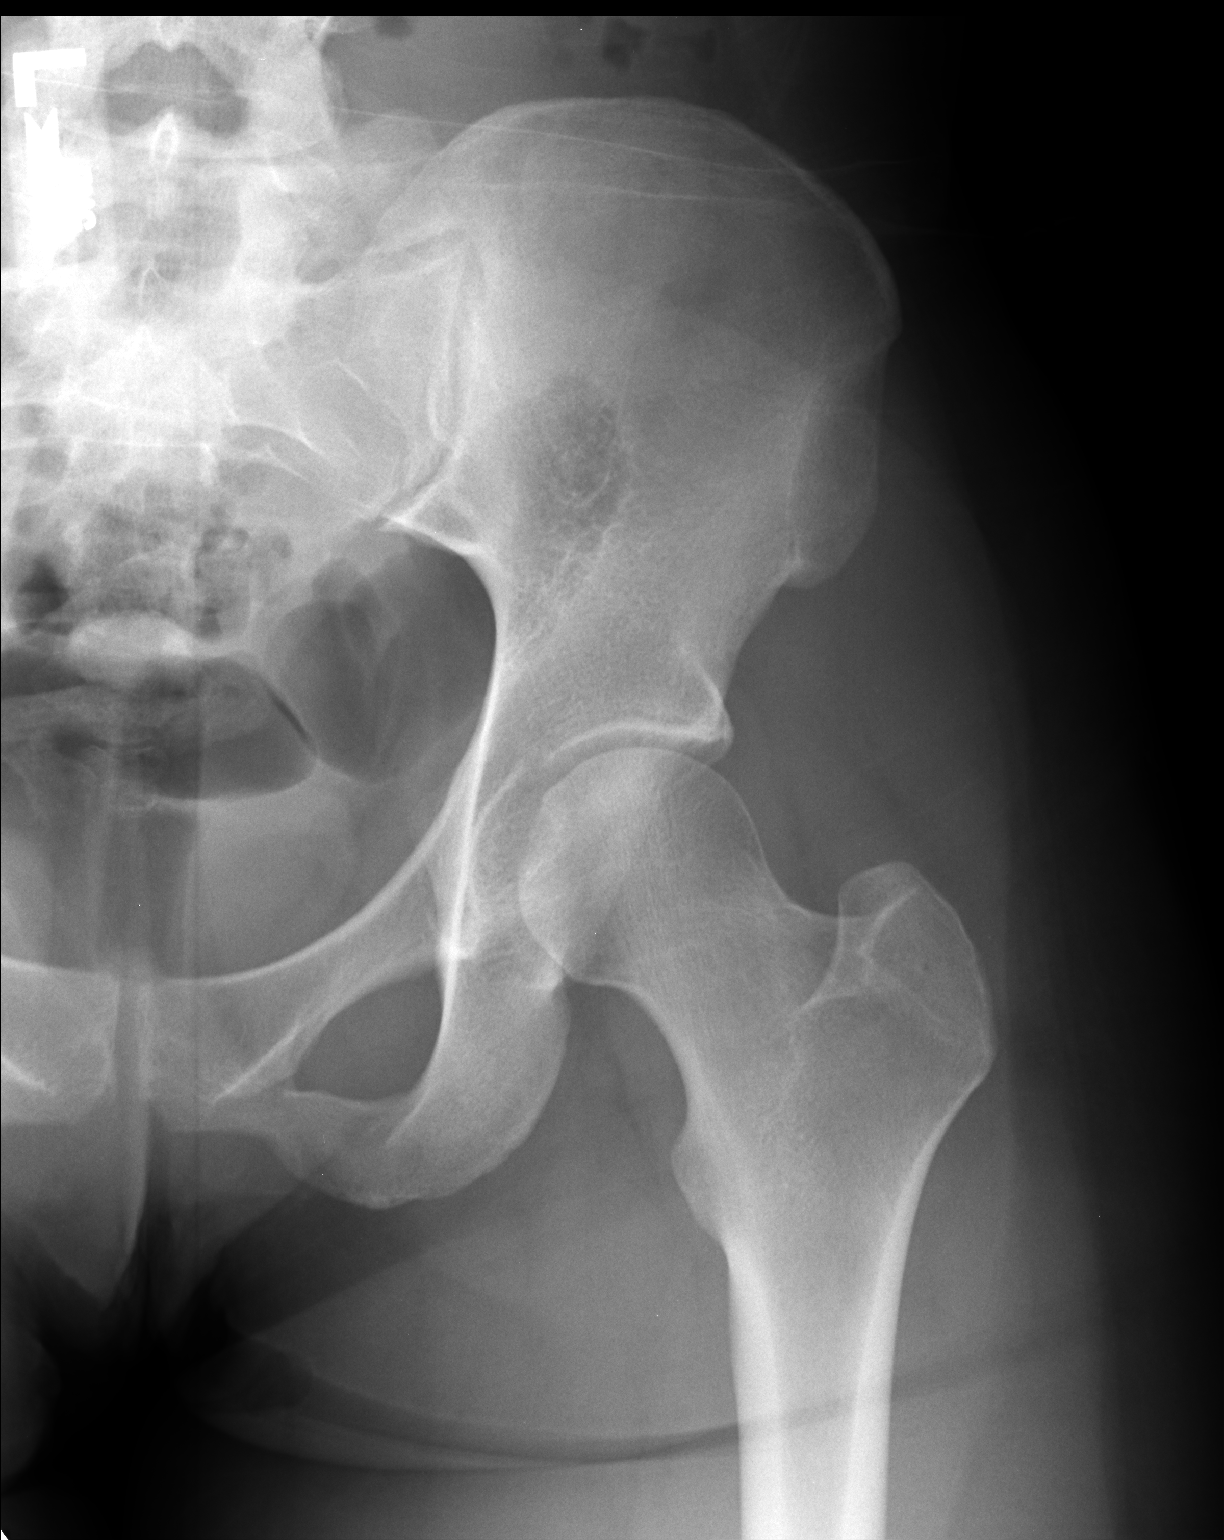

[lateral]
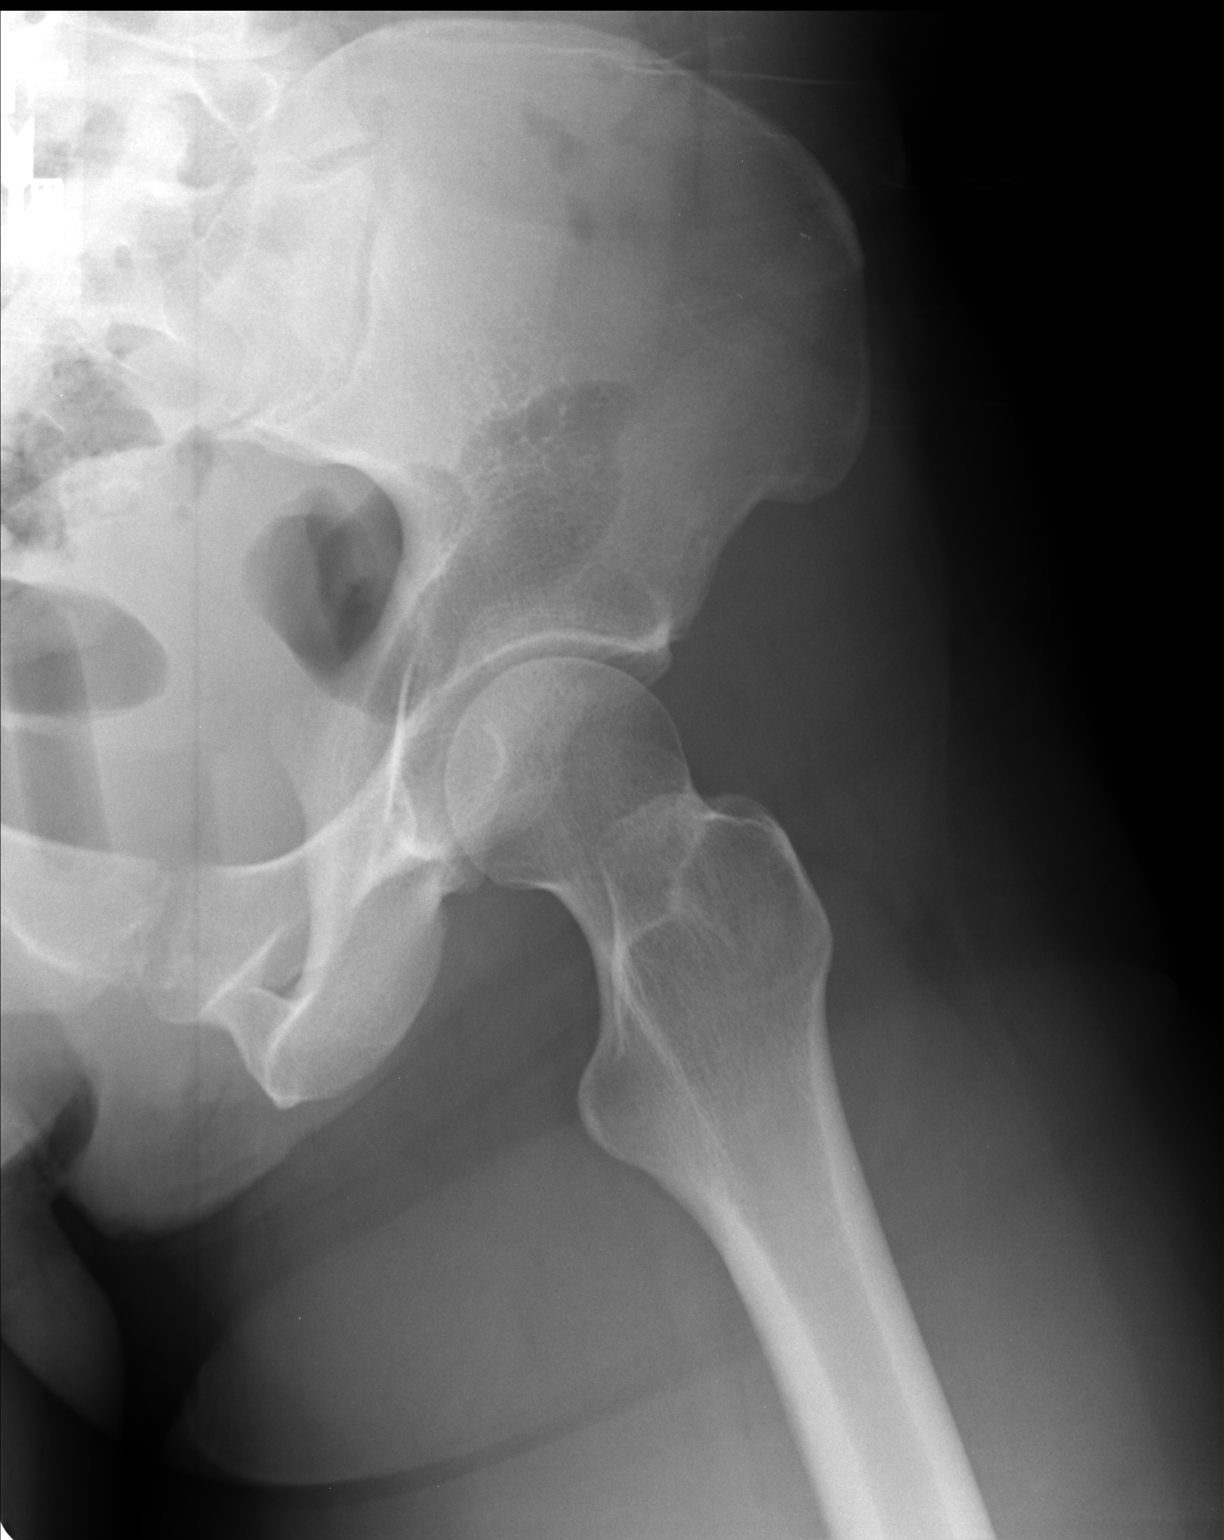

[3 of 3 positions shown; findings below may reference images not displayed]

FINDINGS: There is no evidence of hip fracture or dislocation. There is no
evidence of arthropathy or other focal bone abnormality.
IMPRESSION: Negative.

## 2017-07-09 ENCOUNTER — Telehealth: Payer: Self-pay | Admitting: Family Medicine

## 2017-07-09 ENCOUNTER — Other Ambulatory Visit: Payer: Self-pay | Admitting: *Deleted

## 2017-07-09 DIAGNOSIS — G5600 Carpal tunnel syndrome, unspecified upper limb: Secondary | ICD-10-CM

## 2017-07-09 DIAGNOSIS — Z0184 Encounter for antibody response examination: Secondary | ICD-10-CM

## 2017-07-09 NOTE — Telephone Encounter (Signed)
Patient was seen 11/2016 by Malva Cogan, PA for Carpal Tunnel. Okay to place referral or does patient need to be seen again?

## 2017-07-09 NOTE — Telephone Encounter (Signed)
Spoke with patient. She states that she did not have the shot - but she had chicken pox as a child. She is asking what she needs to do to show this.    Advised patient that we will probably need to draw titer on her to verify  - Routing to PCP to advise.

## 2017-07-09 NOTE — Telephone Encounter (Signed)
Orders for blood draw have been placed. Patient is going to go to the [place] office for labs since this is closer to her job.

## 2017-07-09 NOTE — Telephone Encounter (Signed)
Ok for referral?

## 2017-07-09 NOTE — Addendum Note (Signed)
Addended by: Lenis Dickinson on: 07/09/2017 09:01 AM   Modules accepted: Orders

## 2017-07-09 NOTE — Telephone Encounter (Signed)
Copied from CRM [id number]. Topic: Referral - Request >> Jul 09, 2017  8:39 AM Bridget Mcbride, [name] wrote: Reason for CRM: Patient called and would like a referral to see someone for her hands, she thinks she has carpal tunnel. Please call patient back, thanks.

## 2017-07-09 NOTE — Telephone Encounter (Signed)
Copied from CRM [id number]. Topic: Quick Communication - See Telephone Encounter >> Jul 09, 2017  8:41 AM Louie Bun, [name] wrote: CRM for notification. See Telephone encounter for: 07/09/17. Patient would like a cope of her immunization records that has when she got her varicella shot. Please call patient when this is ready she needs it for her job.

## 2017-07-09 NOTE — Addendum Note (Signed)
Addended by: Lenis Dickinson on: 07/09/2017 09:43 AM   Modules accepted: Orders

## 2017-07-09 NOTE — Telephone Encounter (Signed)
Referral has been placed. Patient is aware.  

## 2017-07-09 NOTE — Telephone Encounter (Signed)
Will need varicella titer to prove immunity

## 2017-07-25 ENCOUNTER — Other Ambulatory Visit: Payer: BLUE CROSS/BLUE SHIELD

## 2017-07-25 DIAGNOSIS — Z0184 Encounter for antibody response examination: Secondary | ICD-10-CM

## 2017-07-26 ENCOUNTER — Telehealth: Payer: Self-pay | Admitting: Family Medicine

## 2017-07-26 LAB — VARICELLA ZOSTER ANTIBODY, IGG: Varicella IgG: 1999 index

## 2017-07-26 NOTE — Telephone Encounter (Signed)
LM for patient to let her know results are not back yet.

## 2017-07-26 NOTE — Telephone Encounter (Signed)
Copied from CRM [id number]. Topic: Quick Communication - See Telephone Encounter >> Jul 26, 2017  3:26 PM Raquel SarnaHayes, Teresa G wrote: Pt needing the proof of the varicella vaccine she received for immunity to chicken pox. Please fax today to:  Fax [phone] Bridget Mcbride

## 2017-10-03 LAB — HM PAP SMEAR

## 2017-10-15 ENCOUNTER — Encounter: Payer: Self-pay | Admitting: General Practice

## 2017-12-12 ENCOUNTER — Telehealth: Payer: Self-pay | Admitting: Hematology & Oncology

## 2017-12-12 NOTE — Telephone Encounter (Signed)
Pt has been scheduled to see Dr. Pamelia Hoit on 11/20 at 345pm. Ms. Campoy is a former pt of Dr. Myna Hidalgo and wanted to transfer to a facility closer to home. Letter mailed.

## 2018-01-08 ENCOUNTER — Telehealth: Payer: Self-pay | Admitting: Hematology and Oncology

## 2018-01-08 ENCOUNTER — Inpatient Hospital Stay: Payer: BLUE CROSS/BLUE SHIELD | Attending: Hematology and Oncology | Admitting: Hematology and Oncology

## 2018-01-08 DIAGNOSIS — Z86718 Personal history of other venous thrombosis and embolism: Secondary | ICD-10-CM | POA: Insufficient documentation

## 2018-01-08 DIAGNOSIS — Z3A21 21 weeks gestation of pregnancy: Secondary | ICD-10-CM | POA: Diagnosis not present

## 2018-01-08 DIAGNOSIS — I82402 Acute embolism and thrombosis of unspecified deep veins of left lower extremity: Secondary | ICD-10-CM

## 2018-01-08 DIAGNOSIS — O99112 Other diseases of the blood and blood-forming organs and certain disorders involving the immune mechanism complicating pregnancy, second trimester: Secondary | ICD-10-CM | POA: Insufficient documentation

## 2018-01-08 MED ORDER — ENOXAPARIN SODIUM 80 MG/0.8ML ~~LOC~~ SOLN: 80.0000 mg | SUBCUTANEOUS | 6 refills | Status: DC

## 2018-01-08 NOTE — Telephone Encounter (Signed)
Gave pt avs and calendar  °

## 2018-01-08 NOTE — Assessment & Plan Note (Signed)
History of blood clot May 2017 left popliteal vein Evaluated for different causes and felt to be related to poor oral contraception Lupus anticoagulant was negative  Prior treatment: 1 year of Xarelto completed May 2018  Patient is currently pregnant Recommendation: 1.  Anticoagulation with Lovenox 1.5 mg/kg subcu daily until 1 week prior to delivery 2. subsequently switched to heparin 5000 subcu 3 times daily until delivery 3.  Postpartum use Lovenox 1.5 mg/kg subcu daily for 1 month  Expected date of delivery 05/19/2018  I discussed risks and benefits of Lovenox. Patient is going on a cruise trip in January.  I warned her to be very careful not to get injured during this trip.  Return to clinic 2 weeks prior to delivery to discuss switching her to heparin

## 2018-01-08 NOTE — Progress Notes (Signed)
[facility] CONSULT NOTE  Patient Care Team: Sheliah Hatch, MD as PCP - General (Family Medicine) Henreitta Leber, PA-C as Physician Assistant (Obstetrics and Gynecology)  CHIEF COMPLAINTS/PURPOSE OF CONSULTATION:  Pregnancy with hypercoagulability disorder  HISTORY OF PRESENTING ILLNESS:  Bridget Mcbride 27 y.o. female is here because patient is pregnant and has a history of DVT related to oral contraceptive use.  She was previously seen by Dr. Myna Hidalgo who gave her 1 year of anticoagulation with Xarelto which completed in May 2018.  She wanted to see [place] so she can be closer to her home.  She is not [redacted] weeks pregnant and was referred by her gynecologist to be seen by [place] to discuss anticoagulation.  Previous work-up was negative for lupus anticoagulant.  She has a history of PCOS and that is why she was taking birth control.  I reviewed her records extensively and collaborated the history with the patient.  SUMMARY OF ONCOLOGIC HISTORY:  No history exists.     MEDICAL HISTORY:  Past Medical History:  Diagnosis Date  . Anemia   . BV (bacterial vaginosis)   . Chlamydia   . GERD (gastroesophageal reflux disease)     SURGICAL HISTORY: No past surgical history on file.  SOCIAL HISTORY: Social History   Socioeconomic History  . Marital status: Single    Spouse name: Not on file  . Number of children: Not on file  . Years of education: Not on file  . Highest education level: Not on file  Occupational History  . Not on file  Social Needs  . Financial resource strain: Not on file  . Food insecurity:    Worry: Not on file    Inability: Not on file  . Transportation needs:    Medical: Not on file    Non-medical: Not on file  Tobacco Use  . Smoking status: Never Smoker  . Smokeless tobacco: Never Used  Substance and Sexual Activity  . Alcohol use: Yes    Alcohol/week: 0.0 standard drinks    Comment: occasional  . Drug use: No  . Sexual activity:  Never    Birth control/protection: Pill  Lifestyle  . Physical activity:    Days per week: Not on file    Minutes per session: Not on file  . Stress: Not on file  Relationships  . Social connections:    Talks on phone: Not on file    Gets together: Not on file    Attends religious service: Not on file    Active member of club or organization: Not on file    Attends meetings of clubs or organizations: Not on file    Relationship status: Not on file  . Intimate partner violence:    Fear of current or ex partner: Not on file    Emotionally abused: Not on file    Physically abused: Not on file    Forced sexual activity: Not on file  Other Topics Concern  . Not on file  Social History Narrative  . Not on file    FAMILY HISTORY: Family History  Problem Relation Age of Onset  . Migraines Father   . Diabetes Paternal Aunt   . Arthritis Paternal Grandmother   . Hypertension Mother   . Hypertension Maternal Aunt   . Hypertension Maternal Uncle   . Stroke Maternal Uncle   . Hypertension Maternal Aunt   . Hypertension Maternal Aunt   . Hypertension Maternal Aunt   . Stroke Cousin  ALLERGIES:  has No Known Allergies.  MEDICATIONS:  Current Outpatient Medications  Medication Sig Dispense Refill  . enoxaparin (LOVENOX) 80 MG/0.8ML injection Inject 0.8 mLs (80 mg total) into the skin daily. 30 Syringe 6  . Ibuprofen 200 MG CAPS ibuprofen    . meloxicam (MOBIC) 15 MG tablet Take 1 tablet (15 mg total) by mouth daily. 30 tablet 0  . valACYclovir (VALTREX) 500 MG tablet Take 500 mg by mouth as needed.     No current facility-administered medications for this visit.     REVIEW OF SYSTEMS:   Constitutional: Denies fevers, chills or abnormal night sweats Eyes: Denies blurriness of vision, double vision or watery eyes Ears, nose, mouth, throat, and face: Denies mucositis or sore throat Respiratory: Denies cough, dyspnea or wheezes Cardiovascular: Denies palpitation, chest  discomfort or lower extremity swelling Gastrointestinal: Abdomen pregnant  skin: Denies abnormal skin rashes Lymphatics: Denies new lymphadenopathy or easy bruising Neurological:Denies numbness, tingling or new weaknesses Behavioral/Psych: Mood is stable, no new changes    All other systems were reviewed with the patient and are negative.  PHYSICAL EXAMINATION: ECOG PERFORMANCE STATUS: 1 - Symptomatic but completely ambulatory  Vitals:   01/08/18 1602  BP: 117/63  Pulse: 77  Resp: 18  Temp: 98.6 F (37 C)  SpO2: 100%   Filed Weights   01/08/18 1602  Weight: 145 lb 8 oz (66 kg)    GENERAL:alert, no distress and comfortable SKIN: skin color, texture, turgor are normal, no rashes or significant lesions EYES: normal, conjunctiva are pink and non-injected, sclera clear OROPHARYNX:no exudate, no erythema and lips, buccal mucosa, and tongue normal  NECK: supple, thyroid normal size, non-tender, without nodularity LYMPH:  no palpable lymphadenopathy in the cervical, axillary or inguinal LUNGS: clear to auscultation and percussion with normal breathing effort HEART: regular rate & rhythm and no murmurs and no lower extremity edema ABDOMEN: Pregnant belly Musculoskeletal:no cyanosis of digits and no clubbing  PSYCH: alert & oriented x 3 with fluent speech NEURO: no focal motor/sensory deficits    LABORATORY DATA:  I have reviewed the data as listed Lab Results  Component Value Date   WBC 4.3 12/03/2016   HGB 13.5 12/03/2016   HCT 38.9 12/03/2016   MCV 88 12/03/2016   PLT 185 12/03/2016   Lab Results  Component Value Date   NA 138 12/03/2016   K 4.1 12/03/2016   CL 103 12/03/2016   CO2 29 12/03/2016    RADIOGRAPHIC STUDIES: I have personally reviewed the radiological reports and agreed with the findings in the report.  ASSESSMENT AND PLAN:  DVT of lower extremity (deep venous thrombosis) (HCC) History of blood clot May 2017 left popliteal vein Evaluated for  different causes and felt to be related to poor oral contraception Lupus anticoagulant was negative  Prior treatment: 1 year of Xarelto completed May 2018  Patient is currently pregnant Recommendation: 1.  Anticoagulation with Lovenox 1.5 mg/kg subcu daily until 1 week prior to delivery 2. subsequently switched to heparin 5000 subcu 3 times daily until delivery 3.  Postpartum use Lovenox 1.5 mg/kg subcu daily for 1 month  Expected date of delivery 05/19/2018  I discussed risks and benefits of Lovenox. Patient is going on a cruise trip in January.  I warned her to be very careful not to get injured during this trip.  Return to clinic 2 weeks prior to delivery to discuss switching her to heparin   All questions were answered. The patient knows to call the clinic  with any problems, questions or concerns.    Tamsen MeekViinay K Kebra Lowrimore, MD 01/08/18

## 2018-01-30 ENCOUNTER — Telehealth: Payer: Self-pay | Admitting: Hematology and Oncology

## 2018-01-30 ENCOUNTER — Telehealth: Payer: Self-pay

## 2018-01-30 ENCOUNTER — Telehealth: Payer: Self-pay | Admitting: *Deleted

## 2018-01-30 NOTE — Telephone Encounter (Signed)
"  Vernona RiegerLaura RN, Dr. Normand Sloopillard with [facility] calling about this mutual patient we saw 01-28-2018 asking our office to teach how to administer Lovenox injections.  We do not teach injections here.  Do you all do this in your office?"

## 2018-01-30 NOTE — Telephone Encounter (Signed)
Received call from "Vernona RiegerLaura" [facility] regarding pt has not started on the Lovenox that was ordered back in November with Dr Pamelia HoitGudena and they would like us to arrange patient teaching for self- administration of Lovenox.  Called pt and pt verbalized she has not started the Lovenox, she said was "non-complaint" and she has spoke with Dr. Normand Sloopillard, pt verbalized that she understands that she needs to start Lovenox and will start Lovenox.  Appt given for patient teaching in flush dept here tomorrow at 2 pm.  Pt verbalized she has picked up the prescription and will bring with her tomorrow.  Reminded pt of treatment plan per Dr Pamelia HoitGudena per his notes from November 20th- reminded her that she can contact us if she has any other questions or concerns, pt voiced understanding.  No other needs per pt at this time.  I called [facility] "Vernona RiegerLaura" back and let her know that pt has appt for pt teaching tomorrow.

## 2018-01-30 NOTE — Telephone Encounter (Signed)
Scheduled appt per 12/12 sch message - pt is aware of appt date and time .   

## 2018-01-31 ENCOUNTER — Inpatient Hospital Stay: Payer: BLUE CROSS/BLUE SHIELD | Attending: Hematology and Oncology

## 2018-01-31 NOTE — Progress Notes (Signed)
Patient here to learn how to inject Lovenox correctly.  Nurse went over details of how to give injection and patient was able to show nurse how to give injection.  She knows to call center if she has any questions or concerns.

## 2018-02-06 ENCOUNTER — Encounter (HOSPITAL_COMMUNITY): Payer: Self-pay | Admitting: *Deleted

## 2018-02-06 ENCOUNTER — Inpatient Hospital Stay (HOSPITAL_COMMUNITY)
Admission: AD | Admit: 2018-02-06 | Discharge: 2018-02-06 | Disposition: A | Discharge status: Home / Self Care | Payer: BLUE CROSS/BLUE SHIELD | Attending: Obstetrics and Gynecology | Admitting: Obstetrics and Gynecology

## 2018-02-06 DIAGNOSIS — Z86718 Personal history of other venous thrombosis and embolism: Secondary | ICD-10-CM | POA: Diagnosis present

## 2018-02-06 DIAGNOSIS — Z3A25 25 weeks gestation of pregnancy: Secondary | ICD-10-CM | POA: Diagnosis not present

## 2018-02-06 DIAGNOSIS — O09892 Supervision of other high risk pregnancies, second trimester: Secondary | ICD-10-CM | POA: Insufficient documentation

## 2018-02-06 MED ORDER — BETAMETHASONE SOD PHOS & ACET 6 (3-3) MG/ML IJ SUSP
12.0000 mg | Freq: Once | INTRAMUSCULAR | Status: AC
  Administered 2018-02-06: 12 mg via INTRAMUSCULAR
  Filled 2018-02-06: qty 2

## 2018-02-06 NOTE — MAU Note (Signed)
Pt here for BMZ injection 

## 2018-02-07 ENCOUNTER — Inpatient Hospital Stay (HOSPITAL_COMMUNITY)
Admission: AD | Admit: 2018-02-07 | Discharge: 2018-02-07 | Disposition: A | Discharge status: Home / Self Care | Payer: BLUE CROSS/BLUE SHIELD | Source: Ambulatory Visit | Attending: Obstetrics and Gynecology | Admitting: Obstetrics and Gynecology

## 2018-02-07 DIAGNOSIS — O26872 Cervical shortening, second trimester: Secondary | ICD-10-CM | POA: Diagnosis present

## 2018-02-07 DIAGNOSIS — Z3A25 25 weeks gestation of pregnancy: Secondary | ICD-10-CM | POA: Diagnosis not present

## 2018-02-07 MED ORDER — BETAMETHASONE SOD PHOS & ACET 6 (3-3) MG/ML IJ SUSP
12.0000 mg | Freq: Once | INTRAMUSCULAR | Status: AC
  Administered 2018-02-07: 12 mg via INTRAMUSCULAR
  Filled 2018-02-07: qty 2

## 2018-02-07 NOTE — MAU Note (Signed)
Here for 2nd dose of Betamethasone, no complaints today.  Asked to here babies heartbeat , +FM

## 2018-02-13 ENCOUNTER — Other Ambulatory Visit (HOSPITAL_COMMUNITY): Payer: Self-pay | Admitting: Obstetrics and Gynecology

## 2018-02-13 DIAGNOSIS — Z363 Encounter for antenatal screening for malformations: Secondary | ICD-10-CM

## 2018-02-13 DIAGNOSIS — O26872 Cervical shortening, second trimester: Secondary | ICD-10-CM

## 2018-02-13 DIAGNOSIS — Z3A27 27 weeks gestation of pregnancy: Secondary | ICD-10-CM

## 2018-02-14 ENCOUNTER — Encounter (HOSPITAL_COMMUNITY): Payer: Self-pay

## 2018-02-18 ENCOUNTER — Encounter (HOSPITAL_COMMUNITY): Payer: Self-pay | Admitting: *Deleted

## 2018-02-18 ENCOUNTER — Encounter (HOSPITAL_COMMUNITY): Payer: Self-pay

## 2018-02-18 ENCOUNTER — Ambulatory Visit (HOSPITAL_BASED_OUTPATIENT_CLINIC_OR_DEPARTMENT_OTHER)
Admission: RE | Admit: 2018-02-18 | Discharge: 2018-02-18 | Disposition: A | Discharge status: Home / Self Care | Payer: BLUE CROSS/BLUE SHIELD | Source: Ambulatory Visit | Attending: Obstetrics and Gynecology | Admitting: Obstetrics and Gynecology

## 2018-02-18 ENCOUNTER — Ambulatory Visit (HOSPITAL_COMMUNITY)
Admission: RE | Admit: 2018-02-18 | Discharge: 2018-02-18 | Disposition: A | Discharge status: Home / Self Care | Payer: BLUE CROSS/BLUE SHIELD | Source: Ambulatory Visit | Attending: Obstetrics and Gynecology | Admitting: Obstetrics and Gynecology

## 2018-02-18 DIAGNOSIS — Z3A27 27 weeks gestation of pregnancy: Secondary | ICD-10-CM | POA: Diagnosis present

## 2018-02-18 DIAGNOSIS — O26872 Cervical shortening, second trimester: Secondary | ICD-10-CM | POA: Insufficient documentation

## 2018-02-18 DIAGNOSIS — O359XX0 Maternal care for (suspected) fetal abnormality and damage, unspecified, not applicable or unspecified: Secondary | ICD-10-CM | POA: Diagnosis not present

## 2018-02-18 DIAGNOSIS — O3433 Maternal care for cervical incompetence, third trimester: Secondary | ICD-10-CM | POA: Diagnosis present

## 2018-02-18 DIAGNOSIS — Z363 Encounter for antenatal screening for malformations: Secondary | ICD-10-CM | POA: Diagnosis not present

## 2018-02-18 DIAGNOSIS — O2232 Deep phlebothrombosis in pregnancy, second trimester: Secondary | ICD-10-CM

## 2018-02-18 HISTORY — DX: Irritable bowel syndrome, unspecified: K58.9

## 2018-02-18 HISTORY — DX: Acute embolism and thrombosis of unspecified deep veins of unspecified lower extremity: I82.409

## 2018-02-18 HISTORY — DX: Herpesviral infection, unspecified: B00.9

## 2018-02-18 NOTE — Progress Notes (Signed)
CONSULT  Date of Service: 02/18/2018 Requesting provider: Donnel Saxon CNM Reason for Consultation: Shortened cervix   Bridget Mcbride,  I met with Bridget Mcbride at your request regarding management of shortened cervix. She is G64P0 african [place] female here at 27 weeks and 1 day. Overall she is doing well without complaints.  She denies vaginal bleeding, loss of fluid or uterine contractions. She also denies pelvic pressure or constant need for a bowel movement.  In review of her history she has had with low risk panorama result and low risk AFP. An early finding of fetal pyelectasis was noted but now resolved.  She had a history of DVT while being on OCP's. She had a negative thrombophilia evaluation. A treatment decision via hematology is therapeutic Lovenox 1.5k/unit daily.  Most importantly, she was found on 12/19 to have a shortened cervix of 1.4-1.9 cm with funneling. She was started in vaginal progesterone and received Betamethasone.  Past Medical History:  Diagnosis Date  . Anemia   . BV (bacterial vaginosis)   . Chlamydia   . DVT (deep venous thrombosis) ([place])    Assoc w/OCP's  . GERD (gastroesophageal reflux disease)   . HSV infection   . IBS (irritable bowel syndrome)    No past surgical history on file.  Social History   Tobacco Use  . Smoking status: Never Smoker  . Smokeless tobacco: Never Used  Substance Use Topics  . Alcohol use: Yes    Alcohol/week: 0.0 standard drinks    Comment: occasional  . Drug use: No   . Family History  Problem Relation Age of Onset  . Migraines Father   . Diabetes Paternal Aunt   . Arthritis Paternal Grandmother   . Hypertension Mother   . Hypertension Maternal Aunt   . Hypertension Maternal Uncle   . Stroke Maternal Uncle   . Hypertension Maternal Aunt   . Hypertension Maternal Aunt   . Hypertension Maternal Aunt   . Stroke Cousin    Current Outpatient Medications on File Prior to Encounter  Medication Sig Dispense  Refill  . enoxaparin (LOVENOX) 80 MG/0.8ML injection Inject 0.8 mLs (80 mg total) into the skin daily. 30 Syringe 6  . Ibuprofen 200 MG CAPS ibuprofen    . meloxicam (MOBIC) 15 MG tablet Take 1 tablet (15 mg total) by mouth daily. (Patient not taking: Reported on 02/18/2018) 30 tablet 0  . Prenatal Vit-Fe Fumarate-FA (PRENATAL VITAMIN PO) Take by mouth.    . progesterone (PROMETRIUM) 100 MG capsule Take 100 mg by mouth daily.    . valACYclovir (VALTREX) 500 MG tablet Take 500 mg by mouth as needed.     No current facility-administered medications on file prior to encounter.    Ultrasound Findings:  EFW 24th % 848g NO functional cervical length with funneling to internal os.  Impression/Counseling:  Cervical insufficiency: I discussed today's findings with Bridget Mcbride and her significant other. We discussed that there was no further functions cervical length. We discussed the clinical implications including inpatient management vs. Outpatient management. I discussed the benefits and risk of each approach. At this time she has decreased her activity including pelvic rest. She has received one course of betamethasone.  I agreed to keep her on the outpatient basis unless her cervix began to dilate. We discussed how quickly she can move from closed short cervix to open dilation. I provided counseling and signs and symptoms of preterm labor.   I recommend weekly prenatal appointments and if Bridget Mcbride begins to  dilate I recommend inpatient admission and give steroid if delivery is likely in 48 hours.  Secondly, she should continue daily vaginal progesterone.  Bridget Mcbride had had plan for a cruise but cancelled plans. I provided she and her significant other a letter recommended no travel at this time.  Thirdly, I reviewed her Lovenox plan and dosing with a plan to switch to heparin at 36 weeks. In addition, please continue anticoagulation until 6 weeks postpartum.  Lastly, we  recommend a history indicated cerclage at the next future pregnancy.  I spent 40 minutes with Bridget Mcbride with >50% in face to face consultation.  All questions answered.

## 2018-02-18 NOTE — ED Notes (Signed)
Patient in restroom when called.

## 2018-02-19 NOTE — L&D Delivery Note (Addendum)
Delivery Note At 0722 patient was noted to be complete. Per phone orders by Dr. [name], patient was to be AROMed and baby delivered. AROM clear at 0729. At 7:35 AM a viable female was delivered via  (Presentation: direct OA).  APGAR: 7, 8; weight 3 lb 2.8 oz (1440 g).   Placenta status: delivered spontaneously and completely.  Cord: three vessel with the following complications: n/a. Per NICU physician, 30 seconds of delayed cord clamping were done because baby was vigorous and crying.   Anesthesia:  None Episiotomy:  N/a Lacerations:  Superficial hemostatic left labial Suture Repair: n/a Est. Blood Loss (mL):  Estimated at  Mom to postpartum.  Baby to NICU.  [name] 03/19/2018, 8:05 AM

## 2018-02-20 ENCOUNTER — Other Ambulatory Visit: Payer: Self-pay

## 2018-02-20 ENCOUNTER — Inpatient Hospital Stay (HOSPITAL_COMMUNITY)
Admission: AD | Admit: 2018-02-20 | Discharge: 2018-03-21 | DRG: 805 | Disposition: A | Discharge status: Home / Self Care | Payer: BLUE CROSS/BLUE SHIELD | Attending: Obstetrics & Gynecology | Admitting: Obstetrics & Gynecology

## 2018-02-20 ENCOUNTER — Inpatient Hospital Stay (HOSPITAL_BASED_OUTPATIENT_CLINIC_OR_DEPARTMENT_OTHER): Payer: BLUE CROSS/BLUE SHIELD

## 2018-02-20 ENCOUNTER — Encounter (HOSPITAL_COMMUNITY): Payer: Self-pay

## 2018-02-20 DIAGNOSIS — B9689 Other specified bacterial agents as the cause of diseases classified elsewhere: Secondary | ICD-10-CM | POA: Diagnosis present

## 2018-02-20 DIAGNOSIS — Z3A27 27 weeks gestation of pregnancy: Secondary | ICD-10-CM

## 2018-02-20 DIAGNOSIS — Z86718 Personal history of other venous thrombosis and embolism: Secondary | ICD-10-CM | POA: Diagnosis not present

## 2018-02-20 DIAGNOSIS — N883 Incompetence of cervix uteri: Secondary | ICD-10-CM

## 2018-02-20 DIAGNOSIS — O4592 Premature separation of placenta, unspecified, second trimester: Secondary | ICD-10-CM | POA: Diagnosis present

## 2018-02-20 DIAGNOSIS — O3433 Maternal care for cervical incompetence, third trimester: Secondary | ICD-10-CM

## 2018-02-20 DIAGNOSIS — Z7901 Long term (current) use of anticoagulants: Secondary | ICD-10-CM | POA: Diagnosis not present

## 2018-02-20 DIAGNOSIS — O26872 Cervical shortening, second trimester: Secondary | ICD-10-CM

## 2018-02-20 DIAGNOSIS — O9962 Diseases of the digestive system complicating childbirth: Secondary | ICD-10-CM | POA: Diagnosis present

## 2018-02-20 DIAGNOSIS — O2232 Deep phlebothrombosis in pregnancy, second trimester: Secondary | ICD-10-CM

## 2018-02-20 DIAGNOSIS — K589 Irritable bowel syndrome without diarrhea: Secondary | ICD-10-CM | POA: Diagnosis present

## 2018-02-20 DIAGNOSIS — O26873 Cervical shortening, third trimester: Secondary | ICD-10-CM | POA: Diagnosis not present

## 2018-02-20 DIAGNOSIS — O2233 Deep phlebothrombosis in pregnancy, third trimester: Secondary | ICD-10-CM | POA: Diagnosis not present

## 2018-02-20 DIAGNOSIS — A6000 Herpesviral infection of urogenital system, unspecified: Secondary | ICD-10-CM | POA: Diagnosis present

## 2018-02-20 DIAGNOSIS — O3432 Maternal care for cervical incompetence, second trimester: Secondary | ICD-10-CM

## 2018-02-20 DIAGNOSIS — K219 Gastro-esophageal reflux disease without esophagitis: Secondary | ICD-10-CM | POA: Diagnosis present

## 2018-02-20 DIAGNOSIS — O9832 Other infections with a predominantly sexual mode of transmission complicating childbirth: Secondary | ICD-10-CM | POA: Diagnosis present

## 2018-02-20 DIAGNOSIS — Z3A30 30 weeks gestation of pregnancy: Secondary | ICD-10-CM | POA: Diagnosis not present

## 2018-02-20 DIAGNOSIS — A539 Syphilis, unspecified: Secondary | ICD-10-CM | POA: Clinically undetermined

## 2018-02-20 LAB — CBC
HEMATOCRIT: 43.8 % (ref 36.0–46.0)
Hemoglobin: 14.7 g/dL (ref 12.0–15.0)
MCH: 30.1 pg (ref 26.0–34.0)
MCHC: 33.6 g/dL (ref 30.0–36.0)
MCV: 89.6 fL (ref 80.0–100.0)
Platelets: 225 10*3/uL (ref 150–400)
RBC: 4.89 MIL/uL (ref 3.87–5.11)
RDW: 13 % (ref 11.5–15.5)
WBC: 9.6 10*3/uL (ref 4.0–10.5)
nRBC: 0 % (ref 0.0–0.2)

## 2018-02-20 LAB — URINALYSIS, ROUTINE W REFLEX MICROSCOPIC
Bilirubin Urine: NEGATIVE
Glucose, UA: NEGATIVE mg/dL
Hgb urine dipstick: NEGATIVE
KETONES UR: NEGATIVE mg/dL
Leukocytes, UA: NEGATIVE
Nitrite: NEGATIVE
Protein, ur: NEGATIVE mg/dL
Specific Gravity, Urine: 1.015 (ref 1.005–1.030)
pH: 6 (ref 5.0–8.0)

## 2018-02-20 LAB — OB RESULTS CONSOLE RUBELLA ANTIBODY, IGM: Rubella: IMMUNE

## 2018-02-20 LAB — WET PREP, GENITAL
Sperm: NONE SEEN
Trich, Wet Prep: NONE SEEN
Yeast Wet Prep HPF POC: NONE SEEN

## 2018-02-20 LAB — TYPE AND SCREEN
ABO/RH(D): B POS
Antibody Screen: NEGATIVE

## 2018-02-20 LAB — OB RESULTS CONSOLE GC/CHLAMYDIA
Chlamydia: NEGATIVE
Gonorrhea: NEGATIVE

## 2018-02-20 LAB — OB RESULTS CONSOLE RPR: RPR: NONREACTIVE

## 2018-02-20 LAB — OB RESULTS CONSOLE HIV ANTIBODY (ROUTINE TESTING): HIV: NONREACTIVE

## 2018-02-20 LAB — OB RESULTS CONSOLE HEPATITIS B SURFACE ANTIGEN: Hepatitis B Surface Ag: NEGATIVE

## 2018-02-20 MED ORDER — LACTATED RINGERS IV SOLN: 500.0000 mL | INTRAVENOUS | Status: DC | PRN

## 2018-02-20 MED ORDER — HEPARIN SODIUM (PORCINE) 5000 UNIT/ML IJ SOLN
5000.0000 [IU] | Freq: Three times a day (TID) | INTRAMUSCULAR | Status: AC
  Administered 2018-02-21 – 2018-02-25 (×15): 5000 [IU] via SUBCUTANEOUS
  Filled 2018-02-20 (×17): qty 1

## 2018-02-20 MED ORDER — ZOLPIDEM TARTRATE 5 MG PO TABS
5.0000 mg | ORAL_TABLET | Freq: Every evening | ORAL | Status: DC | PRN
  Administered 2018-03-05 – 2018-03-16 (×9): 5 mg via ORAL
  Filled 2018-02-20 (×10): qty 1

## 2018-02-20 MED ORDER — LIDOCAINE HCL (PF) 1 % IJ SOLN: 30.0000 mL | INTRAMUSCULAR | Status: DC | PRN

## 2018-02-20 MED ORDER — LACTATED RINGERS IV SOLN
INTRAVENOUS | Status: DC
  Administered 2018-02-20 – 2018-02-21 (×2): via INTRAVENOUS

## 2018-02-20 MED ORDER — OXYTOCIN 40 UNITS IN LACTATED RINGERS INFUSION - SIMPLE MED: 2.5000 [IU]/h | INTRAVENOUS | Status: DC

## 2018-02-20 MED ORDER — ACETAMINOPHEN 325 MG PO TABS
650.0000 mg | ORAL_TABLET | ORAL | Status: DC | PRN
  Administered 2018-02-21 – 2018-03-18 (×10): 650 mg via ORAL
  Filled 2018-02-20 (×11): qty 2

## 2018-02-20 MED ORDER — ENOXAPARIN SODIUM 80 MG/0.8ML ~~LOC~~ SOLN: 80.0000 mg | SUBCUTANEOUS | Status: DC

## 2018-02-20 MED ORDER — DOCUSATE SODIUM 100 MG PO CAPS
100.0000 mg | ORAL_CAPSULE | Freq: Every day | ORAL | Status: DC
  Administered 2018-02-21 – 2018-03-18 (×26): 100 mg via ORAL
  Filled 2018-02-20 (×27): qty 1

## 2018-02-20 MED ORDER — OXYTOCIN BOLUS FROM INFUSION: 500.0000 mL | Freq: Once | INTRAVENOUS | Status: DC

## 2018-02-20 MED ORDER — MAGNESIUM SULFATE 40 G IN LACTATED RINGERS - SIMPLE
1.0000 g/h | INTRAVENOUS | Status: AC
  Administered 2018-02-22: 1 g/h via INTRAVENOUS
  Filled 2018-02-20 (×2): qty 500

## 2018-02-20 MED ORDER — CALCIUM CARBONATE ANTACID 500 MG PO CHEW: 2.0000 | CHEWABLE_TABLET | ORAL | Status: DC | PRN

## 2018-02-20 MED ORDER — OXYCODONE-ACETAMINOPHEN 5-325 MG PO TABS: 1.0000 | ORAL_TABLET | ORAL | Status: DC | PRN

## 2018-02-20 MED ORDER — PRENATAL MULTIVITAMIN CH: 1.0000 | ORAL_TABLET | Freq: Every day | ORAL | Status: DC

## 2018-02-20 MED ORDER — MAGNESIUM SULFATE BOLUS VIA INFUSION
4.0000 g | Freq: Once | INTRAVENOUS | Status: AC
  Administered 2018-02-20: 4 g via INTRAVENOUS
  Filled 2018-02-20: qty 500

## 2018-02-20 MED ORDER — ONDANSETRON HCL 4 MG/2ML IJ SOLN: 4.0000 mg | Freq: Four times a day (QID) | INTRAMUSCULAR | Status: DC | PRN

## 2018-02-20 MED ORDER — SOD CITRATE-CITRIC ACID 500-334 MG/5ML PO SOLN: 30.0000 mL | ORAL | Status: DC | PRN

## 2018-02-20 MED ORDER — OXYCODONE-ACETAMINOPHEN 5-325 MG PO TABS: 2.0000 | ORAL_TABLET | ORAL | Status: DC | PRN

## 2018-02-20 MED ORDER — PROGESTERONE MICRONIZED 200 MG PO CAPS
200.0000 mg | ORAL_CAPSULE | Freq: Every day | ORAL | Status: DC
  Administered 2018-02-21 – 2018-03-17 (×25): 200 mg via VAGINAL
  Filled 2018-02-20 (×26): qty 1

## 2018-02-20 MED ORDER — VALACYCLOVIR HCL 500 MG PO TABS
500.0000 mg | ORAL_TABLET | Freq: Two times a day (BID) | ORAL | Status: DC
  Administered 2018-02-20 – 2018-03-21 (×58): 500 mg via ORAL
  Filled 2018-02-20 (×60): qty 1

## 2018-02-20 NOTE — MAU Note (Signed)
Pt states that on 02/18/2018 she noticed blood in her discharge.   Pt states yesterday she walked around all day and is now noticing spotting here and there.    Pt is not having to wear a pad.

## 2018-02-20 NOTE — MAU Note (Signed)
Urine in the lab  

## 2018-02-20 NOTE — H&P (Signed)
Bridget Mcbride is a 28 y.o. female, G1P0 at 27.3 weeks, presenting for spotting.  Pt has a history of cervical incompetence diagnosised recently.  Was placed on vaginal progesterone on 12/19 and received vaginal progesterone.  Patient states she has been spotting intermittently since Dec 31 and had an incident today around lunch time that was bright red with wiping.  She reports that despite her short cervix she continues to work and perform activities such as light shopping.  She endorses good fetal movement and denies abdominal pain/cramping.  However, she does report some abdominal pressure.  Pt has a history of a DVT on ocps.  Currently on Lovenox 1.5k/unit daily.  Denies leaking of fluid.  Pt has hx of HSV not on suppression denies lesions  Patient Active Problem List   Diagnosis Date Noted  . Indication for care in labor or delivery 02/20/2018  . Acute deep vein thrombosis (DVT) of distal vein of left lower extremity (HCC) 04/18/2016  . DVT of lower extremity (deep venous thrombosis) (HCC) 07/04/2015  . Posterior right knee pain 03/23/2015  . Inattention 09/26/2014  . Routine general medical examination at a health care facility 08/04/2013  . BV (bacterial vaginosis) 03/03/2012    History of present pregnancy: Patient entered care at 17.1 weeks.   EDC of 05/19/18 was established by LMP.   Anatomy scan:  weeks, with normal findings and an anterior placenta.   Additional [place] evaluations:  Cervical length and funneling Significant prenatal events: None Last evaluation:02/18/2018  OB History    Gravida  1   Para      Term      Preterm      AB      Living        SAB      TAB      Ectopic      Multiple      Live Births             Past Medical History:  Diagnosis Date  . Anemia   . BV (bacterial vaginosis)   . Chlamydia   . DVT (deep venous thrombosis) (HCC)    Assoc w/OCP's  . GERD (gastroesophageal reflux disease)   . HSV infection   . IBS (irritable  bowel syndrome)    History reviewed. No pertinent surgical history. Family History: family history includes Arthritis in her paternal grandmother; Diabetes in her paternal aunt; Hypertension in her maternal aunt, maternal aunt, maternal aunt, maternal aunt, maternal uncle, and mother; Migraines in her father; Stroke in her cousin and maternal uncle. Social History:  reports that she has never smoked. She has never used smokeless tobacco. She reports previous alcohol use. She reports that she does not use drugs.   Prenatal Transfer Tool  Maternal Diabetes: No Genetic Screening: Normal Maternal Ultrasounds/Referrals: Abnormal:  Findings:   Other: Fetal Ultrasounds or other Referrals:  Referred to Materal Fetal Medicine  Maternal Substance Abuse:  No Significant Maternal Medications:  Lovenox Significant Maternal Lab Results: None  ROS: All 10 systems reviewed and negative except as stated above  No Known Allergies     Blood pressure 121/67, pulse 75, temperature 98.6 F (37 C), temperature source Oral, resp. rate 16, weight 67.8 kg, last menstrual period 08/11/2017, SpO2 97 %. Results for orders placed or performed during the hospital encounter of 02/20/18 (from the past 24 hour(s))  Urinalysis, Routine w reflex microscopic     Status: None   Collection Time: 02/20/18  7:05 PM  Result  Value Ref Range   Color, Urine YELLOW YELLOW   APPearance CLEAR CLEAR   Specific Gravity, Urine 1.015 1.005 - 1.030   pH 6.0 5.0 - 8.0   Glucose, UA NEGATIVE NEGATIVE mg/dL   Hgb urine dipstick NEGATIVE NEGATIVE   Bilirubin Urine NEGATIVE NEGATIVE   Ketones, ur NEGATIVE NEGATIVE mg/dL   Protein, ur NEGATIVE NEGATIVE mg/dL   Nitrite NEGATIVE NEGATIVE   Leukocytes, UA NEGATIVE NEGATIVE  Wet prep, genital     Status: Abnormal   Collection Time: 02/20/18  8:09 PM  Result Value Ref Range   Yeast Wet Prep HPF POC NONE SEEN NONE SEEN   Trich, Wet Prep NONE SEEN NONE SEEN   Clue Cells Wet Prep HPF POC  PRESENT (A) NONE SEEN   WBC, Wet Prep HPF POC FEW (A) NONE SEEN   Sperm NONE SEEN   CBC     Status: None   Collection Time: 02/20/18  8:12 PM  Result Value Ref Range   WBC 9.6 4.0 - 10.5 K/uL   RBC 4.89 3.87 - 5.11 MIL/uL   Hemoglobin 14.7 12.0 - 15.0 g/dL   HCT 16.143.8 09.636.0 - 04.546.0 %   MCV 89.6 80.0 - 100.0 fL   MCH 30.1 26.0 - 34.0 pg   MCHC 33.6 30.0 - 36.0 g/dL   RDW 40.913.0 81.111.5 - 91.415.5 %   Platelets 225 150 - 400 K/uL   nRBC 0.0 0.0 - 0.2 %  Type and screen [facility]     Status: None   Collection Time: 02/20/18  8:12 PM  Result Value Ref Range   ABO/RH(D) B POS    Antibody Screen NEG    Sample Expiration      02/23/2018 [facility]formed at [facility], [address] [place], [place] [id number]   US showed vtx presentation Chest clear Heart RRR without murmur Abd gravid, NT, FH appropriate Pelvic: Per CNM in MAU Ext: Neg  FHR: Category 2 FHT BL 145 occ variable variability present UCs:  irreg  Prenatal labs: ABO, Rh:  B positive Antibody:  Neg Rubella:   Immune RPR:   NR HBsAg:   Neg HIV:   Neg GBS:  pending Sickle cell/Hgb electrophoresis:  AA GC:  Neg Chlamydia:  Neg Genetic screenings:  NIPT negative  Other:   Hgb 14.7       Assessment/Plan: IUP at 27.3 incompetent cervix Cat 1 strip BV  Plan: Admit to Birthing Suite per consult with Dr. Mora ApplPinn Antepartum admission Switch to Heparin Magnesium sulfate for neuroprotection GBS pending  Henderson NewcomerNancy Jean ProtheroCNM, MSN 02/20/2018, 8:59 PM

## 2018-02-20 NOTE — MAU Provider Note (Signed)
History     CSN: [id number]  Arrival date and time: 02/20/18 1844   None     Chief Complaint  Patient presents with  . Vaginal Bleeding   Jacqulyn CaneJasmine N Lindahl is a 28 year old G1P0 at 27.3wks who presents for spotting in context of known short cervix.  Patient states she has been spotting intermittently since Dec 31 and had an incident today around lunch time that was bright red with wiping.  She further states she had an incident this morning in which she noted brown discharge on her pad.  She reports that despite her short cervix she continues to work and perform activities such as light shopping.  She endorses good fetal movement and denies abdominal pain/cramping.  However, she does report some abdominal pressure.  Patient denies pain, constipation, diarrhea, N/V, or issues with urination.      OB History    Gravida  1   Para      Term      Preterm      AB      Living        SAB      TAB      Ectopic      Multiple      Live Births              Past Medical History:  Diagnosis Date  . Anemia   . BV (bacterial vaginosis)   . Chlamydia   . DVT (deep venous thrombosis) (HCC)    Assoc w/OCP's  . GERD (gastroesophageal reflux disease)   . HSV infection   . IBS (irritable bowel syndrome)     History reviewed. No pertinent surgical history.  Family History  Problem Relation Age of Onset  . Migraines Father   . Diabetes Paternal Aunt   . Arthritis Paternal Grandmother   . Hypertension Mother   . Hypertension Maternal Aunt   . Hypertension Maternal Uncle   . Stroke Maternal Uncle   . Hypertension Maternal Aunt   . Hypertension Maternal Aunt   . Hypertension Maternal Aunt   . Stroke Cousin     Social History   Tobacco Use  . Smoking status: Never Smoker  . Smokeless tobacco: Never Used  Substance Use Topics  . Alcohol use: Not Currently    Alcohol/week: 0.0 standard drinks    Comment: occasional  . Drug use: No    Allergies: No Known  Allergies  Medications Prior to Admission  Medication Sig Dispense Refill Last Dose  . enoxaparin (LOVENOX) 80 MG/0.8ML injection Inject 0.8 mLs (80 mg total) into the skin daily. 30 Syringe 6 Taking  . Ibuprofen 200 MG CAPS ibuprofen   Not Taking  . meloxicam (MOBIC) 15 MG tablet Take 1 tablet (15 mg total) by mouth daily. (Patient not taking: Reported on 02/18/2018) 30 tablet 0 Not Taking  . Prenatal Vit-Fe Fumarate-FA (PRENATAL VITAMIN PO) Take by mouth.   Taking  . progesterone (PROMETRIUM) 100 MG capsule Take 100 mg by mouth daily.   Taking  . valACYclovir (VALTREX) 500 MG tablet Take 500 mg by mouth as needed.   Taking    Review of Systems  Constitutional: Negative for fever.  Gastrointestinal: Negative for nausea and vomiting.  Genitourinary: Positive for vaginal bleeding and vaginal discharge. Negative for pelvic pain and vaginal pain.  All other systems reviewed and are negative.  Physical Exam   Blood pressure 121/67, pulse 75, temperature 98.6 F (37 C), temperature source Oral, resp. rate  16, weight 67.8 kg, last menstrual period 08/11/2017, SpO2 97 %.  Physical Exam  Constitutional: She is oriented to person, place, and time. She appears well-developed and well-nourished. No distress.  HENT:  Head: Normocephalic and atraumatic.  Eyes: Conjunctivae are normal.  Neck: Normal range of motion.  Cardiovascular: Normal rate.  Respiratory: Effort normal.  GI: Soft.  Genitourinary:    Vaginal discharge present.     No vaginal bleeding.  No bleeding in the vagina.    Genitourinary Comments: Sterile Speculum Exam: -Vaginal Vault: Moderate amt thick white discharge in vault -wet prep collected. -Cervix:Open with bulging bag of membranes from os and into vault. -Bimanual Exam: Attempted but limited d/t membranes; ~4-5cm GBS Swab collected   Musculoskeletal: Normal range of motion.        General: No edema.  Neurological: She is alert and oriented to person, place, and time.   Skin: Skin is warm and dry.    MAU Course  Procedures  MDM Pelvic exam Labs: Wet Prep & add on as appropriate  Assessment and Plan  IUP at 27.3wks Cat I FT Bulging Bag Membranes H/O DVT H/O HSV   -Patient informed of physical exam findings -Reassurances given -C. Hartsog called and updated on patient status and reports W. Pinn on call. -Dr. Mora Appl contacted and advised of recommendation for admission; Instructed to *Perform BS [place] to confirm presentation *Admit to L&D if cephalic presentation with normal admit orders -Admission orders placed per Dr. Mora Appl instruction and [place] ordered -Valtrex 500 mgBID ordered -Lovenox ordered, but may need to be modified to heparin or MFM recommendation. -Patient to go to room 160  Cherre Robins MSN, CNM 02/20/2018, 7:43 PM

## 2018-02-21 ENCOUNTER — Other Ambulatory Visit: Payer: Self-pay

## 2018-02-21 ENCOUNTER — Inpatient Hospital Stay (HOSPITAL_COMMUNITY): Payer: BLUE CROSS/BLUE SHIELD

## 2018-02-21 LAB — OB RESULTS CONSOLE GBS
GBS: NEGATIVE
GBS: NEGATIVE

## 2018-02-21 LAB — GROUP B STREP BY PCR: Group B strep by PCR: NEGATIVE

## 2018-02-21 MED ORDER — METRONIDAZOLE 500 MG PO TABS
500.0000 mg | ORAL_TABLET | Freq: Two times a day (BID) | ORAL | Status: AC
  Administered 2018-02-21 – 2018-02-27 (×14): 500 mg via ORAL
  Filled 2018-02-21 (×14): qty 1

## 2018-02-21 MED ORDER — FAMOTIDINE 20 MG PO TABS
20.0000 mg | ORAL_TABLET | Freq: Two times a day (BID) | ORAL | Status: DC
  Administered 2018-02-21 – 2018-03-18 (×52): 20 mg via ORAL
  Filled 2018-02-21 (×52): qty 1

## 2018-02-21 MED ORDER — FAMOTIDINE 20 MG PO TABS
10.0000 mg | ORAL_TABLET | Freq: Two times a day (BID) | ORAL | Status: DC
  Administered 2018-02-21: 10 mg via ORAL
  Filled 2018-02-21: qty 1

## 2018-02-21 MED ORDER — CYCLOBENZAPRINE HCL 5 MG PO TABS
5.0000 mg | ORAL_TABLET | Freq: Once | ORAL | Status: AC
  Administered 2018-02-21: 5 mg via ORAL
  Filled 2018-02-21: qty 1

## 2018-02-21 MED ORDER — BETAMETHASONE SOD PHOS & ACET 6 (3-3) MG/ML IJ SUSP
12.0000 mg | INTRAMUSCULAR | Status: DC
  Administered 2018-02-21: 12 mg via INTRAMUSCULAR
  Filled 2018-02-21: qty 2

## 2018-02-21 MED ORDER — PRENATAL MULTIVITAMIN CH
1.0000 | ORAL_TABLET | Freq: Every day | ORAL | Status: DC
  Administered 2018-02-21 – 2018-02-25 (×5): 1 via ORAL
  Filled 2018-02-21 (×2): qty 1

## 2018-02-21 NOTE — Progress Notes (Addendum)
Subjective: Having some back pain due to bed.  Denies contractions or pain.  Objective: BP (!) 108/53   Pulse 87   Temp 98.6 F (37 C) (Oral)   Resp 18   Ht 5\' 4"  (1.626 m)   Wt 67.8 kg   LMP 08/11/2017 (Exact Date)   SpO2 97%   BMI 25.66 kg/m  I/O last 3 completed shifts: In: 3470.9 [P.O.:630; I.V.:2840.9] Out: 1900 [Urine:1900] Total I/O In: 395 [P.O.:120; I.V.:275] Out: 400 [Urine:400]  FHT: Category 1 UC:   none SVE:    Deferred  Assessment:  27.4 IUP with incompetent cervix Stable Cat 1 strip  Plan: Continue poc.  Henderson Newcomer Prothero CNM, MSN 02/21/2018, 8:36 PM  Called to room because pt was having a left side cramp.  SVE done pt 3/100/ high presenting part, no blood noted on cervix.  Will give patient Flexeril for discomfort.  MD ADDENDUM: Agree with above.  Continue with magnesium sulfate until patient is beta complete.    Dr. [name]. 02/22/2018.  0712am.

## 2018-02-21 NOTE — Progress Notes (Signed)
Hospital day # 1 pregnancy at [redacted]w[redacted]d--incompetent cervix, presented for spotting. Bridget Mcbride is a 28 y.o. female, G1P0 at 58.4.  Subjective:   Discussed plan of care with patient. Consults to NICU and MFM are pending. Patient reports rare mild contractions and + fetal movement. She denies leaking fluid and verbalizes she will report any symptoms of ROM. The uncertainty of time of delivery was discussed.   Objective: Vitals:   02/21/18 0407 02/21/18 0500 02/21/18 0601 02/21/18 0701  BP: 123/60 97/61 107/71 103/65  Pulse: 76 81 76 88  Resp: 18 18 18 16   Temp: 97.7 F (36.5 C)  98 F (36.7 C)   TempSrc: Oral  Oral   SpO2:      Weight:      Height:         Results for orders placed or performed during the hospital encounter of 02/20/18 (from the past 24 hour(s))  Urinalysis, Routine w reflex microscopic     Status: None   Collection Time: 02/20/18  7:05 PM  Result Value Ref Range   Color, Urine YELLOW YELLOW   APPearance CLEAR CLEAR   Specific Gravity, Urine 1.015 1.005 - 1.030   pH 6.0 5.0 - 8.0   Glucose, UA NEGATIVE NEGATIVE mg/dL   Hgb urine dipstick NEGATIVE NEGATIVE   Bilirubin Urine NEGATIVE NEGATIVE   Ketones, ur NEGATIVE NEGATIVE mg/dL   Protein, ur NEGATIVE NEGATIVE mg/dL   Nitrite NEGATIVE NEGATIVE   Leukocytes, UA NEGATIVE NEGATIVE  Wet prep, genital     Status: Abnormal   Collection Time: 02/20/18  8:09 PM  Result Value Ref Range   Yeast Wet Prep HPF POC NONE SEEN NONE SEEN   Trich, Wet Prep NONE SEEN NONE SEEN   Clue Cells Wet Prep HPF POC PRESENT (A) NONE SEEN   WBC, Wet Prep HPF POC FEW (A) NONE SEEN   Sperm NONE SEEN   CBC     Status: None   Collection Time: 02/20/18  8:12 PM  Result Value Ref Range   WBC 9.6 4.0 - 10.5 K/uL   RBC 4.89 3.87 - 5.11 MIL/uL   Hemoglobin 14.7 12.0 - 15.0 g/dL   HCT 07.1 21.9 - 75.8 %   MCV 89.6 80.0 - 100.0 fL   MCH 30.1 26.0 - 34.0 pg   MCHC 33.6 30.0 - 36.0 g/dL   RDW 83.2 54.9 - 82.6 %   Platelets 225 150 -  400 K/uL   nRBC 0.0 0.0 - 0.2 %  Type and screen [facility] [place]     Status: None   Collection Time: 02/20/18  8:12 PM  Result Value Ref Range   ABO/RH(D) B POS    Antibody Screen NEG    Sample Expiration      02/23/2018 Performed at [facility], [address], [place], [place] [address]    FHTs: 129s, moderate beat to beat variability, + 10x10 accels, +occasional variable decels consistent with gestational age.    Assessment/Plan: 28 y.o. G1P0 at [redacted]w[redacted]d Incompetent Cervix: 5cm BBOW per MAU assessment, no spotting/bleeding overnight Category II FHTs but overall reassuring  S/P BMZ 02/06/18, 02/07/18 Dr. 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

## 2018-02-21 NOTE — Consult Note (Signed)
Asked by Dr.Roberts to provide prenatal consultation for patient at risk for preterm delivery due to incompetent cervix and preterm labor.  Mother is 28 y.o. G1 P0 who is now 27.[redacted] weeks EGA and she was admitted last night after presenting with spotting and bulging membranes with cervical dilatation estimated at 4 - 5 cm. Previously she had been seen by MFM and was started on vaginal progesterone and given BMZ 12/19 and 12/20. Since admission last night she has had minimal contractions and bleeding, and no signs of infection.  She is being treated with MgSO4, Valtrex, heparin (for Hx of DVT). GBS culture is pending.  Discussed with patient and FOB usual expectations for preterm infant at 5 - [redacted] weeks gestation, including possible needs for DR resuscitation, respiratory support, IV access, and blood products.  Also presented possibility of death or serious morbidity, although I told them 90+% of patients > 27 wks do well.  Projected possible length of stay in NICU until 37 - [redacted] wks EGA.  Discussed advantages of feeding with mother's milk.  She plans to breast feed and will pump postnatally.  Also discussed use of donor milk.  Patient and FOB were attentive, had appropriate questions, and were appreciative of my input.  Thank you for consulting Neonatology.  Total time 30 minutes, 20 minutes face-to-face  JWimmer, MD

## 2018-02-21 NOTE — Consult Note (Signed)
CONSULT  I met with Bridget Mcbride a 27 yo G1P0 who is admitted to the hospital for cervical insufficiency. I had the pleasure of providing an initial consultation on 02/18/2018 please see note for details and prior history.  I am performing this inpatient consultation at request of Dr. Everett Graff.   Brief history Bridget Mcbride noted recent spotting upon evaluation in the MAU noted 4-5 cm dilation with bulging bag in the vagina. She was admitted to the hospital with last [place] provided on 1/19-1/20. She was placed on Neuroprotection with Magnesium sulfate. NICU consult performed and agree.  The fetus has been reassuring and overall contractions 1-2 per hour. Cephalic presentation.  BP (!) 109/57   Pulse 76   Temp 98 F (36.7 C) (Oral)   Resp 18   Ht _0  (1.626 m)   Wt 67.8 kg   LMP 08/11/2017 (Exact Date)   SpO2 97%   BMI 25.66 kg/m   Impression/Counseling:  Discussed with them the increased likelihood of delivery within 1-14 days. Continue inpatient management. Consider second course of BMZ and tocolysis (calcium channel blocker or indocin) given last course is now 14 days previously. May be transferred to antepartum unit if cervix unchanged for 12-24 hours. Continue Magnesium for neuroprotection for 24 hours total exposure May use the restroom with precautions. She is switched to Heparin 5000 units for DVT prophylaxis. Cesarean delivery for obstetrical indication. Continue daily NST  I spent 30 minutes with Bridget Mcbride and significant other. >50% in face to face consultation.  All questions answered.  Halden Phegley Rolla Etienne

## 2018-02-21 NOTE — Progress Notes (Signed)
Hospital day # 1 pregnancy at [redacted]w[redacted]d--incompetent cervix, presented for spotting. Bridget GUILBERT is a 28 y.o. female, G1P0 at 34.4. S:   Pt is sleeping.  O: BP 97/61   Pulse 81   Temp 97.7 F (36.5 C) (Oral)   Resp 18   Ht 5\' 4"  (1.626 m)   Wt 67.8 kg   LMP 08/11/2017 (Exact Date)   SpO2 97%   BMI 25.66 kg/m       Fetal tracings: category 1 except for 1  Minor variable in last 4 hours      Contractions:   none     Results for orders placed or performed during the hospital encounter of 02/20/18 (from the past 24 hour(s))  Urinalysis, Routine w reflex microscopic     Status: None   Collection Time: 02/20/18  7:05 PM  Result Value Ref Range   Color, Urine YELLOW YELLOW   APPearance CLEAR CLEAR   Specific Gravity, Urine 1.015 1.005 - 1.030   pH 6.0 5.0 - 8.0   Glucose, UA NEGATIVE NEGATIVE mg/dL   Hgb urine dipstick NEGATIVE NEGATIVE   Bilirubin Urine NEGATIVE NEGATIVE   Ketones, ur NEGATIVE NEGATIVE mg/dL   Protein, ur NEGATIVE NEGATIVE mg/dL   Nitrite NEGATIVE NEGATIVE   Leukocytes, UA NEGATIVE NEGATIVE  Wet prep, genital     Status: Abnormal   Collection Time: 02/20/18  8:09 PM  Result Value Ref Range   Yeast Wet Prep HPF POC NONE SEEN NONE SEEN   Trich, Wet Prep NONE SEEN NONE SEEN   Clue Cells Wet Prep HPF POC PRESENT (A) NONE SEEN   WBC, Wet Prep HPF POC FEW (A) NONE SEEN   Sperm NONE SEEN   CBC     Status: None   Collection Time: 02/20/18  8:12 PM  Result Value Ref Range   WBC 9.6 4.0 - 10.5 K/uL   RBC 4.89 3.87 - 5.11 MIL/uL   Hemoglobin 14.7 12.0 - 15.0 g/dL   HCT 38.2 50.5 - 39.7 %   MCV 89.6 80.0 - 100.0 fL   MCH 30.1 26.0 - 34.0 pg   MCHC 33.6 30.0 - 36.0 g/dL   RDW 67.3 41.9 - 37.9 %   Platelets 225 150 - 400 K/uL   nRBC 0.0 0.0 - 0.2 %  Type and screen [facility] [place]     Status: None   Collection Time: 02/20/18  8:12 PM  Result Value Ref Range   ABO/RH(D) B POS    Antibody Screen NEG    Sample Expiration       02/23/2018 Performed at [facility], [address] [place], [place] [address]       A/P: SIUP at [redacted]w[redacted]d with  -Incompetent Cervix: 5cm BBOW per MAU assessment, no spotting/bleeding overnight -In Birthing Suites on bedrest overnight -BMZ 02/06/18, 02/07/18 -NICU consult pending -Vtx by [place] 02/20/18 -On Vaginal progesterone at HS -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 Mora Appl consulted to formulate this plan  -Continue current plan of care -MDs will follow, Dr. Su Hilt to be updated this morning.  Jonetta Speak MSN, CNM,  02/21/2018 5:23 AM

## 2018-02-21 NOTE — Progress Notes (Addendum)
Hospital day # 1 pregnancy at [redacted]w[redacted]d--incompetent cervix, presented for spotting. Bridget Mcbride is a 28 y.o. female, G1P0 at 59.4.  Subjective:   Discussed plan of care with patient. NICU and MFM have rounded but MFM note pending. Patient reports rare mild contractions and + fetal movement. She denies leaking fluid and verbalizes she will report any symptoms of ROM. We discussed possibly moving upstairs if MFM recommends.   Objective: Vitals:   02/21/18 1101 02/21/18 1200 02/21/18 1301 02/21/18 1330  BP: (!) 142/84 109/62 110/62 (!) 109/57  Pulse: (!) 104 83 85 76  Resp: 18 18 18 18   Temp:      TempSrc:      SpO2:      Weight:      Height:         Results for orders placed or performed during the hospital encounter of 02/20/18 (from the past 24 hour(s))  Urinalysis, Routine w reflex microscopic     Status: None   Collection Time: 02/20/18  7:05 PM  Result Value Ref Range   Color, Urine YELLOW YELLOW   APPearance CLEAR CLEAR   Specific Gravity, Urine 1.015 1.005 - 1.030   pH 6.0 5.0 - 8.0   Glucose, UA NEGATIVE NEGATIVE mg/dL   Hgb urine dipstick NEGATIVE NEGATIVE   Bilirubin Urine NEGATIVE NEGATIVE   Ketones, ur NEGATIVE NEGATIVE mg/dL   Protein, ur NEGATIVE NEGATIVE mg/dL   Nitrite NEGATIVE NEGATIVE   Leukocytes, UA NEGATIVE NEGATIVE  Culture, beta strep (group b only)     Status: None (Preliminary result)   Collection Time: 02/20/18  8:08 PM  Result Value Ref Range   Specimen Description      VAGINAL/RECTAL Performed at [facility], [address] [place], [place] [address]    Special Requests      NONE Performed at [facility], [address], [place], [place] [address]    Culture      TOO YOUNG TO READ Performed at [facility], [address], [place], [place] [address]    Report Status PENDING   Wet prep, genital     Status: Abnormal   Collection Time: 02/20/18  8:09 PM  Result Value Ref Range   Yeast Wet Prep HPF POC NONE SEEN NONE  SEEN   Trich, Wet Prep NONE SEEN NONE SEEN   Clue Cells Wet Prep HPF POC PRESENT (A) NONE SEEN   WBC, Wet Prep HPF POC FEW (A) NONE SEEN   Sperm NONE SEEN   CBC     Status: None   Collection Time: 02/20/18  8:12 PM  Result Value Ref Range   WBC 9.6 4.0 - 10.5 K/uL   RBC 4.89 3.87 - 5.11 MIL/uL   Hemoglobin 14.7 12.0 - 15.0 g/dL   HCT 56.9 79.4 - 80.1 %   MCV 89.6 80.0 - 100.0 fL   MCH 30.1 26.0 - 34.0 pg   MCHC 33.6 30.0 - 36.0 g/dL   RDW 65.5 37.4 - 82.7 %   Platelets 225 150 - 400 K/uL   nRBC 0.0 0.0 - 0.2 %  Type and screen [facility] [place]     Status: None   Collection Time: 02/20/18  8:12 PM  Result Value Ref Range   ABO/RH(D) B POS    Antibody Screen NEG    Sample Expiration      02/23/2018 Performed at [facility], [address], [place], [place] [address]    FHTs: 129s, moderate beat to  beat variability, + 10x10 accels, +occasional variable decels consistent with gestational age.    Assessment/Plan: 28 y.o. G1P0 at 3666w4d Incompetent Cervix: 5cm BBOW per MAU assessment, no spotting/bleeding overnight Category II FHTs but overall reassuring  S/P BMZ 02/06/18, 02/07/18 Dr. 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 When I spoke with her she denied feeling vaginal pressure but stated she wasn't sure but felt like something may be different. Dr. Su Hiltoberts aware and the decision was made to defer any vaginal exams as the risk of rupture and subsequent delivery outweighs the benefits of any information we would obtain with the exam. The patient verbalizes understanding.  Bridget Riggersllis K  Mcbride 02/21/18 3:56 PM

## 2018-02-22 LAB — CULTURE, BETA STREP (GROUP B ONLY)

## 2018-02-22 MED ORDER — BETAMETHASONE SOD PHOS & ACET 6 (3-3) MG/ML IJ SUSP
12.0000 mg | Freq: Once | INTRAMUSCULAR | Status: AC
  Administered 2018-02-22: 12 mg via INTRAMUSCULAR
  Filled 2018-02-22: qty 2

## 2018-02-22 MED ORDER — LACTATED RINGERS IV SOLN
INTRAVENOUS | Status: AC
  Administered 2018-02-23 (×2): via INTRAVENOUS

## 2018-02-22 NOTE — Progress Notes (Addendum)
Hospital day # 2 pregnancy at [redacted]w[redacted]d--incompetent cervix, presented for spotting. Bridget Mcbride is a 28 y.o. female, G1P0 at 89.4.  Subjective:   Discussed plan of care with patient. Patient reports rare mild contractions and + fetal movement. She denies leaking fluid and verbalizes she will report any symptoms of ROM. We discussed transferring upstairs as she is stable and we do not believe delivery is imminent. Vaginal exam overnight indicated patient is less dilated than previously believed. Per Dr. [name], magnesium is to continue until patient is betamethasone complete (24 hours after second dose). Per Dr. Su Hilt, patient stable for transfer upstairs and bedrest with bathroom privileges.   Objective: Vitals:   02/22/18 0401 02/22/18 0501 02/22/18 0601 02/22/18 0701  BP: (!) 111/58 (!) 90/52 107/61 (!) 100/52  Pulse: (!) 108 91 76 99  Resp: 16 16 16 16   Temp:      TempSrc:      SpO2:      Weight:      Height:         No results found for this or any previous visit (from the past 24 hour(s)). FHTs: 129s, moderate beat to beat variability, +occasional variable decels consistent with gestational age.  Dilation: 3 Effacement (%): 100 Exam by:: N. Prothero, CNM   Assessment/Plan: 28 y.o. G1P0 at [redacted]w[redacted]d Incompetent Cervix: 3cm, fully effaced, no spotting/bleeding overnight Category II FHTs but overall reassuring  S/P BMZ 02/06/18, 02/07/18. Repeat course ordered yesterday evening. Patient will be beta complete tomorrow at 1700.  Dr. 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

## 2018-02-22 NOTE — Progress Notes (Signed)
FHR was graphing maternal when nurse entered room, but with u/s adjustment, FHR was found in the 140 range.

## 2018-02-22 NOTE — Plan of Care (Signed)
  Problem: Education: Goal: Knowledge of disease or condition will improve Outcome: Progressing   

## 2018-02-23 LAB — RAPID URINE DRUG SCREEN, HOSP PERFORMED
Amphetamines: NOT DETECTED
Barbiturates: NOT DETECTED
Benzodiazepines: NOT DETECTED
Cocaine: NOT DETECTED
Opiates: NOT DETECTED
Tetrahydrocannabinol: NOT DETECTED

## 2018-02-23 MED ORDER — SODIUM CHLORIDE 0.9% FLUSH
3.0000 mL | Freq: Two times a day (BID) | INTRAVENOUS | Status: DC
  Administered 2018-02-23 – 2018-03-09 (×6): 3 mL via INTRAVENOUS

## 2018-02-23 NOTE — Progress Notes (Signed)
Bridget Mcbride Female, 28 y.o., 07/05/1990       MRN: [id number]  Subjective: Patient reports no complaints.  She denies nausea, vomiting, chest pain or shortness of breath.  She denies contractions, vaginal bleeding or leakage of fluid.  With normal gross fetal movement.     Objective: I have reviewed patient's vital signs. Blood pressure 119/66, pulse 92, temperature 98.7 F (37.1 C), temperature source Oral, resp. rate 16, height 5\' 4"  (1.626 m), weight 67.8 kg, last menstrual period 08/11/2017, SpO2 99 %. Gen: NAD CVS: s1. S2, RRR Pulm: Clear to auscultation bilaterally Abdomen: soft, gravid, non tender Extremities: warm and well perfused, no edema, no calf tenderness.  With SCDs on both legs. NST 02/23/2018 at 1400: 140 BL, mod variability, reactive.  TOCO: No contractions.  CVX: deferred.   Current Facility-Administered Medications:  .  acetaminophen (TYLENOL) tablet 650 mg, 650 mg, Oral, Q4H PRN, [name], CNM, 650 mg at 02/21/18 1149 .  calcium carbonate (TUMS - dosed in mg elemental calcium) chewable tablet 400 mg of elemental calcium, 2 tablet, Oral, Q4H PRN, [name], CNM .  docusate sodium (COLACE) capsule 100 mg, 100 mg, Oral, Daily, [name], CNM, 100 mg at 02/23/18 1011 .  famotidine (PEPCID) tablet 20 mg, 20 mg, Oral, BID, [name], CNM, 20 mg at 02/23/18 1011 .  heparin injection 5,000 Units, 5,000 Units, Subcutaneous, Q8H, [name], CNM, 5,000 Units at 02/23/18 [id number] .  lactated ringers infusion, , Intravenous, Continuous, [place], MD, Last Rate: 100 mL/hr at 02/23/18 1015 .  magnesium sulfate 40 grams in LR 500 mL OB infusion, 1 g/hr, Intravenous, Titrated, Ryden Wainer, MD, Last Rate: 12.5 mL/hr at 02/22/18 1652, 1 g/hr at 02/22/18 1652 .  metroNIDAZOLE (FLAGYL) tablet 500 mg, 500 mg, Oral, Q12H, [name], CNM, 500 mg at 02/23/18 1011 .  prenatal multivitamin tablet 1 tablet, 1 tablet, Oral, Q1200, [name], CNM, 1 tablet  at 02/23/18 1012 .  progesterone (PROMETRIUM) capsule 200 mg, 200 mg, Vaginal, QHS, [name], CNM, 200 mg at 02/22/18 2214 .  valACYclovir (VALTREX) tablet 500 mg, 500 mg, Oral, BID, [name], CNM, 500 mg at 02/23/18 1012 .  zolpidem (AMBIEN) tablet 5 mg, 5 mg, Oral, QHS PRN, [name], CNM   Assessment/Plan: 28 y/o G1P0 @ 27 weeks 6 days EGA with cervical incompetence, stable maternal and fetal status, - c/w magnesium sulfate until 1645 when she will be beta complete.  - c/w heparin for history of DVTs.  -GBS negative .   LOS: 3 days  [facility]. MD.  02/23/2018, 3:12 PM

## 2018-02-24 DIAGNOSIS — A539 Syphilis, unspecified: Secondary | ICD-10-CM | POA: Clinically undetermined

## 2018-02-24 LAB — RPR, QUANT+TP ABS (REFLEX)
Rapid Plasma Reagin, Quant: 1:1 {titer} — ABNORMAL HIGH
T Pallidum Abs: REACTIVE — AB

## 2018-02-24 LAB — RPR: RPR Ser Ql: REACTIVE — AB

## 2018-02-24 MED ORDER — NIFEDIPINE 10 MG PO CAPS: 20.0000 mg | ORAL_CAPSULE | ORAL | Status: DC | PRN

## 2018-02-24 MED ORDER — SIMETHICONE 80 MG PO CHEW
80.0000 mg | CHEWABLE_TABLET | Freq: Three times a day (TID) | ORAL | Status: DC | PRN
  Administered 2018-02-24 (×2): 80 mg via ORAL
  Filled 2018-02-24 (×2): qty 1

## 2018-02-24 MED ORDER — NIFEDIPINE 10 MG PO CAPS
10.0000 mg | ORAL_CAPSULE | Freq: Four times a day (QID) | ORAL | Status: DC
  Administered 2018-02-24 – 2018-02-25 (×4): 10 mg via ORAL
  Filled 2018-02-24 (×4): qty 1

## 2018-02-24 MED ORDER — NIFEDIPINE 10 MG PO CAPS: 10.0000 mg | ORAL_CAPSULE | Freq: Four times a day (QID) | ORAL | Status: DC

## 2018-02-24 MED ORDER — PENICILLIN G BENZATHINE 1200000 UNIT/2ML IM SUSP
2.4000 10*6.[IU] | Freq: Once | INTRAMUSCULAR | Status: AC
  Administered 2018-02-24: 2.4 10*6.[IU] via INTRAMUSCULAR
  Filled 2018-02-24: qty 4

## 2018-02-24 NOTE — Progress Notes (Signed)
MD NOTE  Name: Bridget CaneJasmine N Mouser Medical Record Number:  [id number] Date of Birth: Dec 30, 1990 Date of Service: 02/24/2018  28 y.o. G1P0 10744w0d HD#4 admitted for preterm labor advanced cervical dilation, bleeding.   Patient notes that she has more brownish discharge since the magnesium sulfate was stopped.   She denies contractions, no leaking of fluid. Reports good FM.  The patient's past medical history and prenatal records were reviewed.  Additional issues addressed and updated today: Patient Active Problem List   Diagnosis Date Noted  . Indication for care in labor or delivery 02/20/2018  . Acute deep vein thrombosis (DVT) of distal vein of left lower extremity (HCC) 04/18/2016  . DVT of lower extremity (deep venous thrombosis) (HCC) 07/04/2015  . Posterior right knee pain 03/23/2015  . Inattention 09/26/2014  . Routine general medical examination at a health care facility 08/04/2013  . BV (bacterial vaginosis) 03/03/2012    Physical Examination:   Vitals:   02/24/18 0734 02/24/18 1150  BP: (!) 105/53 116/73  Pulse: 68 94  Resp: 15 16  Temp: 98.6 F (37 C) 99.8 F (37.7 C)  SpO2: 100% 100%   General appearance - alert, well appearing, and in no distress Mental status - alert, oriented to person, place, and time  Abd  Soft, gravid, nontender Ex SCDs FHTs  150s, moderate variability accels no decels Toco  Uterine irritability  Cervix: not evaluated  No results found for this or any previous visit (from the past 24 hour(s)).  Assessment: HD#4  3344w0d with h/o preterm labor, advanced dilation & spotting.  Plan: 1. Continue bedrest, with shower and bathroom privileges 2. Steroid complete 3. Continue Heparin TID for DVT prophylaxis 4. Flagyl for BV 5. Will start nifedipine for uterine irritability 6. GBS negative 7. Possible discharge home on bedrest tomorrow depending on MFM recs  Wynonia HazardINN, Quincee Gittens STACIA

## 2018-02-24 NOTE — Progress Notes (Signed)
Jacqulyn Cane Female, 28 y.o., January 15, 1991       MRN: [id number]  Subjective: Patient reports no complaints.  She denies nausea, vomiting, chest pain or shortness of breath.  She denies contractions, vaginal bleeding or leakage of fluid.  With normal gross fetal movement.   Pt denies vaginal itching for discharge, no foul smells, denies leg pain or swelling in lower extremities, denies lesion of HSV outbreak.  Objective: I have reviewed patient's vital signs. Blood pressure 119/66, pulse 92, temperature 98.7 F (37.1 C), temperature source Oral, resp. rate 16, height 5\' 4"  (1.626 m), weight 67.8 kg, last menstrual period 08/11/2017, SpO2 99 %. Gen: NAD CVS: s1. S2, RRR Pulm: Clear to auscultation bilaterally Abdomen: soft, gravid, non tender Extremities: warm and well perfused, no edema, no calf tenderness.  With SCDs on both legs. NST 02/23/2018 at 1400: 140 BL, mod variability, reactive.  TOCO: No contractions.  CVX: deferred.   Current Facility-Administered Medications:  .  acetaminophen (TYLENOL) tablet 650 mg, 650 mg, Oral, Q4H PRN, [name], CNM, 650 mg at 02/21/18 1149 .  calcium carbonate (TUMS - dosed in mg elemental calcium) chewable tablet 400 mg of elemental calcium, 2 tablet, Oral, Q4H PRN, [name], CNM .  docusate sodium (COLACE) capsule 100 mg, 100 mg, Oral, Daily, [name], CNM, 100 mg at 02/24/18 1026 .  famotidine (PEPCID) tablet 20 mg, 20 mg, Oral, BID, [name], CNM, 20 mg at 02/24/18 1026 .  heparin injection 5,000 Units, 5,000 Units, Subcutaneous, Q8H, [name], CNM, 5,000 Units at 02/24/18 0830 .  metroNIDAZOLE (FLAGYL) tablet 500 mg, 500 mg, Oral, Q12H, [name], CNM, 500 mg at 02/24/18 1026 .  prenatal multivitamin tablet 1 tablet, 1 tablet, Oral, Q1200, [name], CNM, 1 tablet at 02/23/18 1012 .  progesterone (PROMETRIUM) capsule 200 mg, 200 mg, Vaginal, QHS, [name], CNM, 200 mg at 02/23/18 2203 .   simethicone (MYLICON) chewable tablet 80 mg, 80 mg, Oral, TID PRN, [place], Ichael Pullara, FNP .  sodium chloride flush (NS) 0.9 % injection 3 mL, 3 mL, Intravenous, Q12H, Prothero, Henderson Newcomer, CNM, 3 mL at 02/24/18 0830 .  valACYclovir (VALTREX) tablet 500 mg, 500 mg, Oral, BID, [name], CNM, 500 mg at 02/24/18 1026 .  zolpidem (AMBIEN) tablet 5 mg, 5 mg, Oral, QHS PRN, [name], CNM   Assessment/Plan: 28 y/o G1P0 @ 27 weeks 6 days EGA with cervical incompetence, last exam was 3/100/High. stable maternal and fetal status, BMZ complete on 01/05 @ 1645 for the second round of BMZ this pregnancy. First dose was 12/19 & 12/20. - c/w magnesium sulfate off on 01/05 @ 1700 - c/w heparin 5000unit SQ Q8H for history of DVTs.  -c/w flagy on day 4 for dx of BV: asymptomatic  -c/w flexeril for back pain PRN, last needed 1/03 @2057  -c/w nightly progesterone 200mg  vaginally for cervical incompetency  -c/w heparin for VTE proh for h/o DVT x3 years ago: asymptomatic currently.  -GBS negative .   LOS: 4 days  [facility]. CNM NP-C 02/24/2018, 12:04 PM

## 2018-02-25 ENCOUNTER — Inpatient Hospital Stay (HOSPITAL_COMMUNITY): Payer: BLUE CROSS/BLUE SHIELD

## 2018-02-25 MED ORDER — NIFEDIPINE 10 MG PO CAPS
10.0000 mg | ORAL_CAPSULE | Freq: Four times a day (QID) | ORAL | Status: DC | PRN
  Administered 2018-03-12 – 2018-03-18 (×6): 10 mg via ORAL
  Filled 2018-02-25 (×8): qty 1

## 2018-02-25 MED ORDER — ENOXAPARIN SODIUM 80 MG/0.8ML ~~LOC~~ SOLN
80.0000 mg | SUBCUTANEOUS | Status: DC
  Filled 2018-02-25: qty 0.8

## 2018-02-25 NOTE — Progress Notes (Addendum)
Hospital day # 5 pregnancy at [redacted]w[redacted]d--incompetent cervix, presented for spotting. Bridget Mcbride is a 28 y.o. female, G1P0 at [redacted]w[redacted]d  Subjective:   Discussed plan of care with patient. Patient reports good fetal movement and notes about once an hour she experiences mild abdominal pain lasting about 30 seconds. She is unsure if this is related to gas or it is a mild contraction.   Objective: Vitals:   02/24/18 1555 02/24/18 1913 02/24/18 2330 02/25/18 0808  BP: 121/67 97/60 (!) 103/56 110/70  Pulse: 78 (!) 102 70 72  Resp: 14 17 18 16   Temp: 98.9 F (37.2 C) 99 F (37.2 C) 98.7 F (37.1 C) 98.7 F (37.1 C)  TempSrc: Oral Oral Oral Oral  SpO2: 100% 100% 100% 99%  Weight:      Height:         No results found for this or any previous visit (from the past 24 hour(s)).   FHTs: 140s, moderate beat to beat variability, nonreactive but consistent with gestational age.  Dilation: 3 Effacement (%): 100 Exam by:: N. Prothero, CNM  Assessment/Plan: 28 y.o. G1P0 at [redacted]w[redacted]d Incompetent Cervix: 3cm, fully effaced, no spotting/bleeding overnight Category I FHTs, nonreactive but overall reassuring  S/P BMZ 12/19-12/20 and 1/3-1/4 GBS negative Consulted Dr. 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

## 2018-02-25 NOTE — Progress Notes (Signed)
Patient IV access leaking IV removed, explained to patient need to put back in and risks of not having one. She expressed her concern to wait until the morning after talking to the doctor to determine if access is still needed

## 2018-02-25 NOTE — Progress Notes (Signed)
Fetal monitor removed for patient to shower

## 2018-02-25 NOTE — Consult Note (Signed)
Follow-up Consult   Bridget Mcbride is a 28w 1d G1P0 who has been hospitalized for preterm labor. She is hospital day 18. She is stable at this time. She has received BMZ x2 course most recent course was on 1/3-1/4.    I performed this follow up consultation per request from Dr. Normand Sloop with regards to discharge planning.  She continues on procardia for tocolysis.   Bridget Mcbride notes that she has had a persistent headache sense starting procardia. She also notes recent spotting that has now resolved.  Her pregnancy issue include: 1) Cervical insufficiency- remains on nightly vaginal progesterone and procardia TID. 2) H/o of VTE on 5000u heparin q 8 hrs  3) New diagnosis and treatment of syphillis.- received Pen G.   BP 116/76 (BP Location: Left Arm)   Pulse 81   Temp 97.8 F (36.6 C) (Axillary)   Resp 16   Ht 5\' 4"  (1.626 m)   Wt 67.8 kg   LMP 08/11/2017 (Exact Date)   SpO2 100%   BMI 25.66 kg/m   Fetal status notes a category II tracing as of the time of this visit (1/7 at 1:15 pm). The tracing is reassuring but not reactive. There are no 10x10 accels. There is moderate variability and no decelerations.  Most recent limited ultrasound documents a cephalic presentation 02/20/18  The SVE is 3 cm per 02/25/18  Impression/Counseling:  1) Cervical Insufficiency: Overall stable but on a tocolytic no further cervical change. Fetus is reassuring but not reactive.  Recommend: Discontinuing nifedipine and monitor fetus in house until reactive. IF stable after 24 hour with no cervical change consider discharge at that time.  2) Prior VTE: on 5000 u heparin q8 hours- Consider increasing heparin dose to 10,000 q12 hour or return back to Lovenox.  3) New diagnosis of syphilis: prior 5 day noted- normal RPR screen, consider repeating RPR. S/p pen g at this time. Bridget Mcbride and significant other denies recent intimacy with each other or different partner.  Thank you for the  opportunity to participate in the care of your patient.  I spent 30 minute with >50% in face to face consultation.

## 2018-02-26 LAB — GLUCOSE TOLERANCE, 1 HOUR: Glucose, 1 Hour GTT: 164 mg/dL — ABNORMAL HIGH (ref 70–140)

## 2018-02-26 LAB — RPR: RPR Ser Ql: NONREACTIVE

## 2018-02-26 MED ORDER — ONE-A-DAY WOMENS PRENATAL 1 28-0.8-235 MG PO CAPS
1.0000 | ORAL_CAPSULE | Freq: Every day | ORAL | Status: DC
  Administered 2018-02-26 – 2018-03-18 (×20): 1 via ORAL

## 2018-02-26 MED ORDER — ENOXAPARIN SODIUM 80 MG/0.8ML ~~LOC~~ SOLN
80.0000 mg | SUBCUTANEOUS | Status: DC
  Administered 2018-02-26 – 2018-03-20 (×23): 80 mg via SUBCUTANEOUS
  Filled 2018-02-26 (×23): qty 0.8

## 2018-02-26 NOTE — Progress Notes (Addendum)
Hospital day # 6 pregnancy at [redacted]w[redacted]d--incompetent cervix, presented for spotting. UCHENNA GOURD is a 28 y.o. female, G1P0 at [redacted]w[redacted]d  Subjective:   Discussed plan of care with patient. Per MFM, patient is a candidate for discharge if she has not made cervical change and baby is reactive. Patient reports good fetal movement and reports she has not been feeling contractions. She noted seeing one on the monitor earlier. She reports spotting last night but none was noted on pad this morning or on fingers after exam. Cervical exam this morning is more consistent with exam on admission and may simply reflect different provider measurements.   Objective: Vitals:   02/25/18 1624 02/25/18 1943 02/25/18 2307 02/26/18 0558  BP: 111/69 120/77 119/70 114/73  Pulse: 83 88 80 73  Resp: 16 20 20 20   Temp: 98.8 F (37.1 C) 98.3 F (36.8 C) 98.2 F (36.8 C) 97.8 F (36.6 C)  TempSrc: Oral Oral Oral Oral  SpO2: 95% 100% 100% 100%  Weight:      Height:         Results for orders placed or performed during the hospital encounter of 02/20/18 (from the past 24 hour(s))  RPR     Status: None   Collection Time: 02/25/18  6:02 PM  Result Value Ref Range   RPR Ser Ql Non Reactive Non Reactive     FHTs: 140s, moderate beat to beat variability, nonreactive but consistent with gestational age.  Dilation: 4.5 Effacement (%): 100 Presentation: Undeterminable Exam by:: [name] CNM   Assessment/Plan: 28 y.o. G1P0 at [redacted]w[redacted]d Incompetent Cervix: 4.5cm, fully effaced, no spotting/bleeding since yesterday evening  Category I or II FHTs, intermittently reactive with occasional variable decels   S/P BMZ 12/19-12/20 and 1/3-1/4 GBS negative Consulted Dr. Su Hilt Patient not a candidate for discharge with spotting noted yesterday night  Bedrest with bathroom privileges Previous NICU and MFM consults completed  Vertex by [place] 02/20/18 Procardia 10mg  PRN for tocolysis Vaginal progesterone qHS HSV on Valtrex  suppression, no lesions + RPR, treated with penicillin  History of DVT, on Lovenox 80mg  QD and wearing SCDs  [name] 02/26/2018 9:33 AM   Spotting with cervical change per [name], CNM exam although it sounds similar to exam on admission so may not be true cervical change.  Will cont to observe.  No evidence of spotting now.   Osborn Coho, MD   Spoke with Dr. Su Hilt regarding 1hr GTT result of 164. Patient has been scheduled for a 3hr GTT on Friday morning. Patient's recent course of steroids may have affected the one hour result.   [name] 02/26/18 3:53 PM

## 2018-02-26 NOTE — Progress Notes (Signed)
S: Pt and husband request to see a  provider to have questions answered. Pt husband is concerned about discharge before 34 weeks.  Does not want a preterm  delivery to happen at home.  Questions answered.  Will rely message to MD in morning. Time spent 30 minutes

## 2018-02-27 NOTE — Progress Notes (Addendum)
Hospital day # 7-Bridget Mcbride is a 28 y.o. female, G1P0 at [redacted]w[redacted]d  Subjective:   Due to cervical change noted on exam yesterday and spotted noted the prior evening. Patient was not discharged home. Patient feels uncomfortable with leaving until her pregnancy becomes more progressed. We discussed plan of care with patient: continued monitoring with NST TID per Dr. [name] unless she has increased pressure or notes more contractions. Per MFM, patient is a candidate for discharge if she has not made cervical change and baby is reactive. Patient reports good fetal movement and reports she has been feeling mild contractions once or twice per hour. She reports spotting after my exam yesterday but none since.   Objective: Vitals:   02/26/18 1956 02/26/18 2322 02/27/18 0618 02/27/18 0813  BP: 126/74 114/70 113/74 119/80  Pulse: 80 86 81 85  Resp: 20 20 20 16   Temp: 98.4 F (36.9 C) 98.2 F (36.8 C) 97.7 F (36.5 C) 98 F (36.7 C)  TempSrc: Oral Oral Oral Oral  SpO2: 100% 99% 100% 100%  Weight:      Height:         Results for orders placed or performed during the hospital encounter of 02/20/18 (from the past 24 hour(s))  Glucose tolerance, 1 hour     Status: Abnormal   Collection Time: 02/26/18 11:14 AM  Result Value Ref Range   Glucose, 1 Hour GTT 164 (H) 70 - 140 mg/dL    Physical Exam  Constitutional: She is oriented to person, place, and time and well-developed, well-nourished, and in no distress.  HENT:  Head: Normocephalic and atraumatic.  Eyes: Pupils are equal, round, and reactive to light.  Pulmonary/Chest: Effort normal. No respiratory distress.  Musculoskeletal: Normal range of motion.  Neurological: She is alert and oriented to person, place, and time.  Skin: Skin is warm and dry.  Psychiatric: Mood, memory, affect and judgment normal.  Vitals reviewed.  FHTs: 140s, moderate beat to beat variability, nonreactive but consistent with gestational age.  Dilation:  4.5 Effacement (%): 100 Presentation: Undeterminable Exam by:: [name] CNM   Assessment/Plan: 28 y.o. G1P0 at [redacted]w[redacted]d Incompetent Cervix: 4.5cm, fully effaced, no spotting/bleeding since yesterday morning Category I or II FHTs, intermittently reactive with occasional variable decels   S/P BMZ 12/19-12/20 and 1/3-1/4 GBS negative 1hr GTT resulted with 164, patient scheduled for 3hr GTT tomorrow Consulted Dr. 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  I have reviewed the Advanced Practitioner's note and chart, and I agree with the management and plan.  I saw and examined patient at bedside and agree with above findings, assessment and plan as outlined above by CNM [name].  Will repeat 1 hr GCT testing on 03/02/2018 as patient recently had betamethasone injection that could impact the GCT readings.    Dr. [name].  02/27/2018 1518.

## 2018-02-28 LAB — TYPE AND SCREEN
ABO/RH(D): B POS
Antibody Screen: NEGATIVE

## 2018-02-28 NOTE — Progress Notes (Addendum)
Hospital day # 8-Bridget Mcbride is a 28 y.o. female, G1P0 at [redacted]w[redacted]d  Subjective:   Patient continues to desire to remain until at least 32 weeks. She denies experiencing contractions overnight or noting any more episodes of spotting. Per MFM, patient is a candidate for discharge if she has not made cervical change and baby is reactive.   Objective: Vitals:   02/27/18 1940 02/27/18 2328 02/28/18 0602 02/28/18 0836  BP: 119/72 115/68 110/70 122/85  Pulse: 95 85 78 85  Resp: 20 20 20 18   Temp: 99.1 F (37.3 C) 98.4 F (36.9 C) 98.5 F (36.9 C) 98 F (36.7 C)  TempSrc: Oral Oral Oral Oral  SpO2: 99% 100% 100% 99%  Weight:      Height:         No results found for this or any previous visit (from the past 24 hour(s)).  Physical Exam  Constitutional: She is oriented to person, place, and time and well-developed, well-nourished, and in no distress.  HENT:  Head: Normocephalic and atraumatic.  Eyes: Pupils are equal, round, and reactive to light.  Pulmonary/Chest: Effort normal. No respiratory distress.  Musculoskeletal: Normal range of motion.  Neurological: She is alert and oriented to person, place, and time.  Skin: Skin is warm and dry.  Psychiatric: Mood, memory, affect and judgment normal.  Vitals reviewed.  FHTs: 150, moderate beat to beat variability, nonreactive with a variable decel noted. Category II but consistent with gestational age and overall reassuring.  Dilation: 4.5 Effacement (%): 100 Presentation: Undeterminable Exam by:: [name] CNM   Assessment/Plan: 28 y.o. 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 We discussed the possibility of there being a contamination as follow-up RPR was negative. He recommended adding-on a repeat TP Abs test to the sample drawn 1/7 in order to definitively determine whether the sample was contaminated and we received a false positive. The test was ordered at this time.   Bridget Mcbride 02/28/18 10:41 AM   Pt seen and examines.  Agree with plan of care.

## 2018-02-28 NOTE — Progress Notes (Signed)
Initial Nutrition Assessment  DOCUMENTATION CODES:   Not applicable  INTERVENTION:  Regular diet May order double protein portions, snacks TID and from retail  NUTRITION DIAGNOSIS:  Increased nutrient needs related to (pregnancy and fetal growth requirements) as evidenced by (28 weeks IUP).  GOAL:  Patient will meet greater than or equal to 90% of their needs  MONITOR:  Weight trends  REASON FOR ASSESSMENT:  Antenatal   ASSESSMENT:  28 4/7 weeks, incomp cervix. usual weight 135 lbs, BMI 23.2  14 lb weight gain. repeat 1 hr GTT 03/02/18. Reports good appetite, good tolerance of diet  Diet Order:   Diet Order            Diet regular Room service appropriate? Yes; Fluid consistency: Thin  Diet effective now             EDUCATION NEEDS:   No education needs have been identified at this time  Skin:  Skin Assessment: Reviewed RN Assessment Height:   Ht Readings from Last 1 Encounters:  02/20/18 5\' 4"  (1.626 m)   Weight:   Wt Readings from Last 1 Encounters:  02/20/18 67.8 kg    Ideal Body Weight:   120 lbs  BMI:  Body mass index is 25.66 kg/m.  Estimated Nutritional Needs:   Kcal:  1800-2000  Protein:  80-90 g  Fluid:  2.1 L    Elisabeth Cara M.Odis Luster LDN Neonatal Nutrition Support Specialist/RD III Pager [phone]      Phone [phone]

## 2018-03-01 MED ORDER — NIFEDIPINE 10 MG PO CAPS
10.0000 mg | ORAL_CAPSULE | Freq: Once | ORAL | Status: AC
  Administered 2018-03-02: 10 mg via ORAL
  Filled 2018-03-01: qty 1

## 2018-03-01 NOTE — Progress Notes (Addendum)
Hospital day # 9-Bridget Mcbride is a 28 y.o. female, G1P0 at [redacted]w[redacted]d  Subjective:   Patient continues to desire to remain until at least 32 weeks. She denies experiencing contractions overnight or noting any more episodes of spotting. Pt did stated she still feels the same amount of pressure, but pressure is relieved when abdomen if held up, I tucked a pillow under her belly which helped to relieve pressure feeling. Per MFM, patient is a candidate for discharge if she has not made cervical change and baby is reactive and pt is aware but pt would still like to stay to be monitored.  Objective: Vitals:   02/28/18 1640 02/28/18 1945 02/28/18 2347 03/01/18 0756  BP: 120/78 106/90 127/74 114/71  Pulse: (!) 105 (!) 106 94 (!) 103  Resp: 18 15 16 16   Temp: 98.3 F (36.8 C) 99.3 F (37.4 C) 98.2 F (36.8 C) 98.5 F (36.9 C)  TempSrc: Oral Oral Oral Oral  SpO2: 100% 99% 99% 99%  Weight:      Height:         Results for orders placed or performed during the hospital encounter of 02/20/18 (from the past 24 hour(s))  Type and screen [facility] [place]     Status: None   Collection Time: 02/28/18 11:09 AM  Result Value Ref Range   ABO/RH(D) B POS    Antibody Screen NEG    Sample Expiration      03/03/2018 Performed at [facility], [address], [place], [place] [address]     Physical Exam  Constitutional: She is oriented to person, place, and time and well-developed, well-nourished, and in no distress.  HENT:  Head: Normocephalic and atraumatic.  Eyes: Pupils are equal, round, and reactive to light.  Pulmonary/Chest: Effort normal. No respiratory distress.  Musculoskeletal: Normal range of motion.  Neurological: She is alert and oriented to person, place, and time.  Skin: Skin is warm and dry. Abdomen: Soft, non-tender, gravida equal to dates.   Psychiatric: Mood, memory, affect and judgment normal.  Vitals reviewed.  FHTs: Last night NST was baseline 150,  moderate beat to beat variability, mildly reactive with 10x10 +acels,-decels. Category 1 and consistent with gestational age and overall reassuring.   Cervical check was deferred, last exam was on 02/27/2018 Dilation: 4.5 Effacement (%): 100 Presentation: Undeterminable Exam by:: [name] CNM   Assessment/Plan: 28 y.o. 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 We discussed the possibility of there being a contamination as follow-up RPR was negative. He recommended adding-on a repeat TP Abs test to the sample drawn 1/7 in order to definitively determine whether the sample was contaminated and we received a false positive. The test was ordered at this time.   [name] 03/01/18 8:39 AM   Dr Normand Sloop to be updated

## 2018-03-01 NOTE — Progress Notes (Signed)
Called by RN to review tracing and because patient reported new onset spotting.  S: Pt feels fine, denies cramping, contractions, back pain. Endorses good FM. Denies urinary urgency, frequency, burning. Describes spotting as mucus the size of a nickel in the toilet bowl, mostly white, with scant amount of pink in it. Also scan amount of pink on toilet tissue when wiping.  O:  Abdomen non-tender, uniformly firm but no uterine contractions palpated. Vulva and mini-pad clean and dry, no sign of bleeding.   A: Very scant spotting, unknown origin but likely cervical.  P: Consulted Dr. Normand Sloop. Plan as follows:  1. Continuous toco 2. Urine culture 3. Procardia 10mg  x 1 now 4. Monitor closely for more bleeding or signs of labor  Pt consulted about plan and agrees to same.   Jonetta Speak, CNM

## 2018-03-02 LAB — GLUCOSE TOLERANCE, 1 HOUR: GLUCOSE 1 HOUR GTT: 135 mg/dL (ref 70–140)

## 2018-03-02 NOTE — Progress Notes (Addendum)
Hospital day # 10 pregnancy at [redacted]w[redacted]d--Incompetent CVX, premature dilation.  S:  Doing well, denies contractions, further bleeding or abnormal discharge.  Endorses fetal movement.  Sleeping comfortably.      Perception of contractions: none      Vaginal bleeding: none now       Vaginal discharge:  no significant change  O: BP 107/64 (BP Location: Right Arm)   Pulse 78   Temp 97.8 F (36.6 C) (Oral)   Resp 17   Ht 5\' 4"  (1.626 m)   Wt 67.8 kg   LMP 08/11/2017 (Exact Date)   SpO2 99%   BMI 25.66 kg/m       Fetal tracings:      Contractions:         Uterus gravid, consistent with 28 weeks and non-tender      Extremities: extremities normal, atraumatic, no cyanosis or edema, Homans sign is negative, no sign of DVT and no significant edema and no signs of DVT    Physical Exam  Constitutional: She is oriented to person, place, and time and well-developed, well-nourished, and in no distress.  HENT:  Head: Normocephalic and atraumatic.  Eyes: Pupils are equal, round, and reactive to light.  Pulmonary/Chest: Effort normal. No respiratory distress.  Musculoskeletal: Normal range of motion.  Neurological: She is alert and oriented to person, place, and time.  Skin: Skin is warm and dry. Abdomen: Soft, non-tender, gravida equal to dates.   Psychiatric: Mood, memory, affect and judgment normal.  Vitals reviewed.  FHTs: Last night NST was baseline 150, moderate variability, no accels, one variable decel. Category 1 and consistent with gestational age and overall reassuring.  NST this morning is pending.    Cervical check was deferred, last exam was on 02/27/2018 Dilation: 4.5 Effacement (%): 100 Presentation: Undeterminable Exam by:: [name] CNM    A: [redacted]w[redacted]d with premature dilation/incompetent cervix     stable  P: Continue current plan of care      28 y.o. 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 Normand Sloop aware  Altamese Cabal CNM, MSN 03/02/2018 8:56 AM Pt still with spotting with wiping. Urine culture is pending.   FTA still pending Continue current care with bleeding precautions

## 2018-03-02 NOTE — Progress Notes (Signed)
50 gram glucose tolerance challenge given to patient by mouth; patient consumed all in nurse's presence.

## 2018-03-02 NOTE — Progress Notes (Signed)
Patient had previously asked me to come at 1000 to give her meds and to put her on EFM. When I entered room at this time, patient requested that I come back after she ate breakfast to give meds and for fetal monitoring. Patient declined to have deep tendon reflexes assessed "because I have bad knees." Instructed patent to call after she finishes breakfast for nursing care.

## 2018-03-03 LAB — CULTURE, OB URINE: Culture: <10000 — AB

## 2018-03-03 LAB — FLUORESCENT TREPONEMAL AB(FTA)-IGG-BLD: Fluorescent Treponemal Ab, IgG: NONREACTIVE

## 2018-03-03 NOTE — Progress Notes (Signed)
Patient stated she had turned on her side about 5 minutes before I came in to adjust u/s to graph FHR. FHR found around 145, when [facility], CNM entered room and told me that continuous EFM could be discontinued, and TID NST's would be resumed.

## 2018-03-03 NOTE — Progress Notes (Signed)
Hospital day # 11-Bridget Mcbride is a 28 y.o. female, G1P0 at [redacted]w[redacted]d  Subjective:   Patient continues to desire to remain until at least 32 weeks. She denies experiencing contractions overnight or noting any more episodes of spotting. Pt did stated she still feels the same amount of pressure, but pressure is relieved when abdomen if held up. Per MFM, patient is a candidate for discharge if she has not made cervical change and baby is reactive and pt is aware but pt would still like to stay to be monitored. Pt now showing frustration with being woken up at night for nursing care and fetal monitoring, due to last night variable decel noted pt was placed on continuous monitoring, pt declines to have that continued, pt asked if she could have a sign placed on her door to not be disturbed from midnight to 8am, pt stated she has not got much sleep since being here and does not want to be bothered when she is resting. Pt endorses her husband got tested for RPR and the result was negative. Pt denies active lesion today. I explained to the pt the reasons behind needing NST performed TID, and that it may not be reactive but the importance of needing the NST were stressed by Dr Su Hilt to continue the TID NST per consult today.   Objective: Vitals:   03/02/18 1547 03/02/18 1941 03/02/18 2224 03/03/18 0935  BP: 116/69 116/71 119/73 125/84  Pulse: 99 96 95 82  Resp: 17 17 17 18   Temp: 99 F (37.2 C) 98.3 F (36.8 C) 98.3 F (36.8 C) 99 F (37.2 C)  TempSrc: Oral Oral Oral Oral  SpO2: 100% 96% 94% 100%  Weight:      Height:         No results found for this or any previous visit (from the past 24 hour(s)).  Physical Exam  Constitutional: She is oriented to person, place, and time and well-developed, well-nourished, and in no distress.  HENT:  Head: Normocephalic and atraumatic.  Eyes: Pupils are equal, round, and reactive to light.  Pulmonary/Chest: Effort normal. No respiratory distress.   Musculoskeletal: Normal range of motion.  Neurological: She is alert and oriented to person, place, and time.  Skin: Skin is warm and dry. Abdomen: Soft, non-tender, gravida equal to dates.   Psychiatric: Mood, memory, affect and judgment normal.  Vitals reviewed.  FHTs: NST was baseline 150, moderate beat to beat variability, mildly reactive with 8x10 +acels,-decels. Category 1 and consistent with gestational age and overall reassuring.   Cervical check was deferred, last exam was on 02/27/2018 Dilation: 4.5 Effacement (%): 100 Presentation: Undeterminable Exam by:: [name] CNM   Assessment/Plan: 28 y.o. 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  Pt stable but feeling frustrated with not getting sleep at night, declines use of med for sleep just wants to be not disturbed from 12-8am.   FWB: Category I or II FHTs, intermittently reactive with occasional variable decels   S/P BMZ 12/19-12/20 and 1/3-1/4, GBS negative  Elevated 1 H GTT: 1hr GTT resulted with 164 likely due to steroids, repeated 1 hr GTT on 1/12, resulted 135. Threshold for CCOB for GDm is above 135.   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    History of DVT: on Lovenox 80mg  QD and wearing SCDs: Lovenox to be stopped if labor progresses and 12 hours before epidural placement.    [facility] CNM, [place] 03/03/2018 11:55 AM   Dr Su Hilt consulted about NST, and recommended to continue TID NST, Dr Su Hilt aware of plan of care and verbalized understanding and agreement.

## 2018-03-04 ENCOUNTER — Encounter (HOSPITAL_COMMUNITY): Payer: Self-pay | Admitting: *Deleted

## 2018-03-04 NOTE — Progress Notes (Signed)
Name: Bridget Mcbride Medical Record Number:  [id number] Date of Birth: [birth year] Date of Service: 03/04/2018  28 y.o. G1P0 [redacted]w[redacted]d HD#12 admitted for 27 weeks spotting and advanced dilation, currently now 29 weeks 1 day.   Pt currently stable with no complaints. She denies contractions, no vaginal bleeding, no leaking of fluid. Reports good FM.  The patient's past medical history and prenatal records were reviewed.  Additional issues addressed and updated today: Patient Active Problem List   Diagnosis Date Noted  . Syphilis 02/24/2018  . Indication for care in labor or delivery 02/20/2018  . Acute deep vein thrombosis (DVT) of distal vein of left lower extremity (HCC) 04/18/2016  . DVT of lower extremity (deep venous thrombosis) (HCC) 07/04/2015  . Posterior right knee pain 03/23/2015  . Inattention 09/26/2014  . Routine general medical examination at a health care facility 08/04/2013  . BV (bacterial vaginosis) 03/03/2012    Physical Examination:   Vitals:   03/04/18 0802 03/04/18 1306  BP: 107/71 123/81  Pulse: 82 (!) 110  Resp: 20 20  Temp: 98.2 F (36.8 C) 98.8 F (37.1 C)  SpO2: 95% 100%   General appearance - alert, well appearing, and in no distress and oriented to person, place, and time Mental status - alert, oriented to person, place, and time  Abd  Soft, gravid, nontender Ex SCDs FHTs  150s, moderate variability accels no decels Toco  None  Cervix: not evaluated  No results found for this or any previous visit (from the past 24 hour(s)).  Assessment: HD#12  [redacted]w[redacted]d with preterm labor, advanced dilation.  Plan: 1. Close monitoring for s/sx of labor 2. Patient on procardia prn tocolysis and vaginal progesterone QHS at night 3. BMZ complete 4. RPR + treated with PCN, second RPR negative  5. H/o DVT on therapeutic lovenox 80 mg QD  Kendell Gammon STACIA

## 2018-03-04 NOTE — Progress Notes (Signed)
Hospital day # 12 pregnancy at 6754w1d--incompetent cervix stable. Pt would like to know when growth US will be done.  S:  Denies complaints.  FM+. Denies contractions or bleeding.      Perception of contractions: none      Vaginal bleeding: none now       Vaginal discharge:  no significant change  O: BP 107/71 (BP Location: Left Arm)   Pulse 82   Temp 98.2 F (36.8 C) (Oral)   Resp 20   Ht 5\' 4"  (1.626 m)   Wt 67.8 kg   LMP 08/11/2017 (Exact Date)   SpO2 95%   BMI 25.66 kg/m       Fetal tracings:      Contractions:         Uterus gravid and non-tender      Extremities: extremities normal, atraumatic, no cyanosis or edema and no significant edema and no signs of DVT          Labs:  None       Meds: See MAR NST last night BL 145 10 x10 noted, no decel noted.   A: 8754w1d with incompetent cervix stable    P: Continue current plan of care      Upcoming tests/treatments:  NST TID,       MDs will follow  Kenney HousemanNancy Jean Tona Qualley CNM, MSN 03/04/2018 9:13 AM

## 2018-03-05 NOTE — Progress Notes (Signed)
Per [facility] CNM- do not renew order for type and screen Q72H at this time. RN to place order if pt shows signs of labor.

## 2018-03-05 NOTE — Progress Notes (Signed)
Hospital day # 13-Nakari NATAYA PHU is a 28 y.o. female, G1P0 at [redacted]w[redacted]d  Subjective:   Patient continues to desire to remain until at least 32 weeks. She denies experiencing contractions overnight or noting any more episodes of spotting.  Per MFM, patient is a candidate for discharge if she has not made cervical change and baby is reactive and pt is aware but pt would still like to stay to be monitored. Pt in better spirits today, stated she has been getting more sleep at night. Pt denies leakage of fluid or vaginal bleeding, pt endorses +FM.  Objective: Vitals:   03/04/18 1919 03/05/18 0003 03/05/18 0901 03/05/18 1144  BP:  111/69 (!) 99/59 115/68  Pulse: 96 83 92 94  Resp: 17 17 20 20   Temp: 98.4 F (36.9 C) 98.2 F (36.8 C) 98 F (36.7 C) 98 F (36.7 C)  TempSrc: Oral Oral Oral Oral  SpO2: 100% 99% 99% 99%  Weight:      Height:         No results found for this or any previous visit (from the past 24 hour(s)).  Physical Exam  Constitutional: She is oriented to person, place, and time and well-developed, well-nourished, and in no distress.  HENT:  Head: Normocephalic and atraumatic.  Eyes: Pupils are equal, round, and reactive to light.  Pulmonary/Chest: Effort normal. No respiratory distress.  Musculoskeletal: Normal range of motion.  Neurological: She is alert and oriented to person, place, and time.  Skin: Skin is warm and dry. Abdomen: Soft, non-tender, gravida equal to dates.   Psychiatric: Mood, memory, affect and judgment normal.  Vitals reviewed.  FHTs: NST was baseline 145, moderate beat to beat variability, -acels,+decels, random variability expected for gestational age. Non reactive consistent with gestational age and overall reassuring.   Cervical check was deferred, last exam was on 02/27/2018 Dilation: 4.5 Effacement (%): 100 Presentation: Undeterminable Exam by:: [name] CNM   Assessment/Plan: 28 y.o. G1P0 at [redacted]w[redacted]d, HD# 11, Incompetent Cervix: 4.5cm,  fully effaced, no spotting/bleeding, Vertex with bedside US.  Pt stable.   FWB: Category I or II FHTs, intermittently reactive with occasional variable decels   S/P BMZ 12/19-12/20 and 1/3-1/4, GBS negative  Elevated 1 H GTT: 1hr GTT resulted with 164 likely due to steroids, repeated 1 hr GTT on 1/12, resulted 135. Threshold for CCOB for GDm is above 135.   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    History of DVT: on Lovenox 80mg  QD and wearing SCDs: Lovenox to be stopped if labor progresses and 24 hours before epidural placement.    [place], [place] 03/05/2018 3:56 PM Late Entry   Dr Su Hilt Updated on POC and pt status.

## 2018-03-06 DIAGNOSIS — N883 Incompetence of cervix uteri: Secondary | ICD-10-CM

## 2018-03-06 NOTE — Progress Notes (Signed)
Hospital day # 14 pregnancy at [redacted]w[redacted]d--Bridget Mcbride a 28 y.o.female, G1P0at [redacted]w[redacted]d.  S:  Pt feels well and is without complaints today. She denies VB, contractions, cramping, LOF. She endorses +FM. We discussed her POC and she does desire to remain in the hospital, is uncomfortable being discharged. She had questions about the reactivity of the tracing and the frequency of NSTs.  I explained the possible variety of tracings and that I, along with her OB team and MFM found her recent tracings reassuring. She had concerns about rupturing her own membranes with her long fingernails while placing her nightly vaginal Prometrium. I offered to have the RN place it for her and also explained how tough the amniotic sac is. She requested to have Blood pressure monitoring frequency reduced and I advised I would consult the MDs.      O: BP 113/76 (BP Location: Left Arm)   Pulse 93   Temp 98.3 F (36.8 C) (Oral)   Resp 18   Ht 5\' 4"  (1.626 m)   Wt 67.8 kg   LMP 08/11/2017 (Exact Date)   SpO2 100%   BMI 25.66 kg/m    Vitals:   03/06/18 0947 03/06/18 1137  BP: 113/76 123/71  Pulse: 93 (!) 115  Resp: 18 17  Temp: 98.3 F (36.8 C) 98.2 F (36.8 C)  SpO2: 100% 99%   No results found for this or any previous visit (from the past 24 hour(s)).  Physical Exam  Constitutional: She is oriented to person, place, and time and well-developed, well-nourished, and in no distress.  HENT:  Head: Normocephalic and atraumatic.  Eyes: Pupils are equal, round, and reactive to light.  Pulmonary/Chest: Effort normal. No respiratory distress.  Musculoskeletal: Normal range of motion.  Neurological: She is alert and oriented to person, place, and time.  Skin: Skin is warm and dry. Abdomen: Soft, non-tender, gravida equal to dates.   Psychiatric: Mood, memory, affect and judgment normal.        Fetal tracings: Baseline 140, moderate variability, 10 x 10 accels present, no decels      Contractions:    none      Uterus gravid and non-tender      Extremities: no significant edema and no signs of DVT   Assessment/Plan: 28 y.o. 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  Pt stable.   FWB: Category I, FHTs, S/P BMZ 12/19-12/20 and 1/3-1/4, GBS negative, changing NSTs to bid (from tid per pt request)  Elevated 1 H GTT:  1 hr GTT on 1/12, resulted 135. Threshold for CCOB for GDm is above 135.   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    History of DVT: on Lovenox 80mg  QD and wearing SCDs: Lovenox to be stopped if labor progresses and 24 hours before epidural placement.   Dr. Normand Sloop consulted about this plan.  Jonetta Speak CNM, MSN 03/06/2018 11:30 AM

## 2018-03-07 NOTE — Progress Notes (Signed)
Hospital day # 15 pregnancy at [redacted]w[redacted]d--incompetent cervix.  S: Pt denies complaints.  Denies contractions or bleeding.  States some pressure when up.  Pt desires to reassess status next week and if stable would like to be discharged next Saturday.        Perception of contractions: none      Vaginal bleeding: none now       Vaginal discharge:  no significant change  O: BP 103/67 (BP Location: Left Arm)   Pulse 94   Temp 98.1 F (36.7 C) (Oral)   Resp 18   Ht 5\' 4"  (1.626 m)   Wt 67.8 kg   LMP 08/11/2017 (Exact Date)   SpO2 100%   BMI 25.66 kg/m       Fetal tracings: FHT 140 variability present no decels  10 x10 accels      Contractions:   None      Uterus gravid and non-tender      Extremities: extremities normal, atraumatic, no cyanosis or edema and no significant edema and no signs of DVT          Labs:  None       Meds: See MAR  A: [redacted]w[redacted]d with incompetent cervix stable     stable  P: Continue current plan of care      Upcoming tests/treatments:  [place] next friday   Kenney Houseman CNM, MSN 03/07/2018 10:58 AM

## 2018-03-08 NOTE — Progress Notes (Signed)
Hospital day # 16 pregnancy at [redacted]w[redacted]d.  S:  Feels well today, no complaints. Denies contractions, cramping, LOF, VB.      Perception of contractions: none      Vaginal bleeding: none now       Vaginal discharge:  no significant change  O: BP 111/68 (BP Location: Right Arm)   Pulse (!) 101   Temp 98.6 F (37 C) (Oral)   Resp 18   Ht 5\' 4"  (1.626 m)   Wt 67.8 kg   LMP 08/11/2017 (Exact Date)   SpO2 100%   BMI 25.66 kg/m    Vitals:   03/07/18 1002 03/07/18 2159 03/08/18 0930 03/08/18 1427  BP: 103/67 124/87 117/84 111/68  Pulse: 94 98 87 (!) 101  Resp: 18 17 16 18   Temp: 98.1 F (36.7 C) 98.6 F (37 C) 98.4 F (36.9 C) 98.6 F (37 C)  TempSrc: Oral Oral Oral Oral  SpO2: 100% 100% 100% 100%  Weight:      Height:            Fetal tracings: Baseline 140, Moderate variability, No accels, No decels      Contractions:   None      Uterus gravid and non-tender      Extremities: no significant edema and no signs of DVT     Assessment/Plan: 28 y.o. 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 Pt stable.Growth [place] scheduled for Thursday 03/13/18. Ms. Teply is hoping baby will be 4#. She has a tentative plan to be discharged to home next Saturday the 25th. 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   History of DVT: on Lovenox 80mg  QD and wearing SCDs: Lovenox to be stopped if labor progresses and24hours before epidural placement.    Jonetta Speak CNM, MSN 03/08/2018 2:52 PM

## 2018-03-09 ENCOUNTER — Encounter: Payer: Self-pay | Admitting: Women's Health

## 2018-03-09 NOTE — Progress Notes (Addendum)
Hospital day # 17-Bridget Mcbride is a 28 y.o. female, G1P0 at [redacted]w[redacted]d  Subjective:   Patient denies any questions at this time. She denies spotting, cramping, or contractions. She reports good fetal movement. Discussed plan for reevaluation of fetal growth and positioning on Thursday and possible discharge on Saturday if she remains stable.   Objective: Vitals:   03/07/18 1002 03/07/18 2159 03/08/18 0930 03/08/18 1427  BP: 103/67 124/87 117/84 111/68  Pulse: 94 98 87 (!) 101  Resp: 18 17 16 18   Temp: 98.1 F (36.7 C) 98.6 F (37 C) 98.4 F (36.9 C) 98.6 F (37 C)  TempSrc: Oral Oral Oral Oral  SpO2: 100% 100% 100% 100%  Weight:      Height:         No results found for this or any previous visit (from the past 24 hour(s)).   Physical Exam  Constitutional: She is oriented to person, place, and time and well-developed, well-nourished, and in no distress.  HENT:  Head: Normocephalic and atraumatic.  Eyes: Pupils are equal, round, and reactive to light.  Pulmonary/Chest: Effort normal. No respiratory distress.  Musculoskeletal: Normal range of motion.  Neurological: She is alert and oriented to person, place, and time.  Skin: Skin is warm and dry.  Psychiatric: Mood, memory, affect and judgment normal.  Vitals reviewed.  FHTs: 150s, moderate beat to beat variability, non-reactive with occasional mild decels expected for gestational age. Consistent with patient's recent tracings.  Dilation: 4.5 Effacement (%): 100 Presentation: Undeterminable Exam by:: [name] CNM   Assessment/Plan: 28 y.o. 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 Retest shows -RPR and reflex Treponemal Antibody IgG. Initial result was a false positive likely due to cross-contamination and lab error  History of DVT, on Lovenox 80mg  QD and wearing SCDs  [name] 03/09/2018 9:37 AM

## 2018-03-09 NOTE — Progress Notes (Signed)
When nurse entered room, patient had taken herself off of EFM and placed belts and cables on top of monitor. Patient was in her bathroom with water running.

## 2018-03-09 NOTE — Progress Notes (Signed)
Instructed patient of CNM's instructions to drink some juice and remain on EFM for now.

## 2018-03-10 ENCOUNTER — Encounter: Payer: Self-pay | Admitting: Advanced Practice Midwife

## 2018-03-10 NOTE — Progress Notes (Signed)
Hospital day # 18-Bridget Mcbride is a 28 y.o. female, G1P0 at [redacted]w[redacted]d for incompetence cervix.  Subjective:   Patient resting in bed in NAD, endorses wanting to remain in house until fetus is 4 poounds. Pt aware growth scan in place for Thursday. She denies experiencing contractions overnight or noting any more episodes of spotting.  Per MFM, patient is a candidate for discharge if she has not made cervical change and baby is reactive and pt is aware but pt would still like to stay to be monitored. Pt in good spirits today, stated she has been getting more sleep at night. Pt denies leakage of fluid or vaginal bleeding, pt endorses +FM.   Objective: Vitals:   03/08/18 0930 03/08/18 1427 03/09/18 1008 03/09/18 2121  BP: 117/84 111/68 111/72 122/90  Pulse: 87 (!) 101 88 100  Resp: 16 18 18 18   Temp: 98.4 F (36.9 C) 98.6 F (37 C) 98.3 F (36.8 C) 98.9 F (37.2 C)  TempSrc: Oral Oral Oral Oral  SpO2: 100% 100% 100% 100%  Weight:      Height:         No results found for this or any previous visit (from the past 24 hour(s)).  Physical Exam  Constitutional: She is oriented to person, place, and time and well-developed, well-nourished, and in no distress.  HENT:  Head: Normocephalic and atraumatic.  Eyes: Pupils are equal, round, and reactive to light.  Pulmonary/Chest: Effort normal. No respiratory distress.  Musculoskeletal: Normal range of motion.  Neurological: She is alert and oriented to person, place, and time.  Skin: Skin is warm and dry. Abdomen: Soft, non-tender, gravida equal to dates.   Psychiatric: Mood, memory, affect and judgment normal.  Vitals reviewed.  FHTs: NST was baseline 150, moderate beat to beat variability, +acels 10x10,-decels. Reactive, consistent with gestational age and overall reassuring.   Cervical check was deferred, last exam was on 02/27/2018 Dilation: 4.5 Effacement (%): 100 Presentation: Undeterminable Exam by:: [name] CNM    Assessment/Plan: 28 y.o. 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  Pt stable.   FWB: Category I or II FHTs, intermittently reactive with occasional variable decels   S/P BMZ 12/19-12/20 and 1/3-1/4, GBS negative. Growth scan planned for Thursday. Pt stated she will be comfortable going home if fetus is 4 pounds.   Elevated 1 H GTT: 1hr GTT resulted with 164 likely due to steroids, repeated 1 hr GTT on 1/12, resulted 135. Threshold for CCOB for GDm is above 135.   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    History of DVT: on Lovenox 80mg  QD and wearing SCDs: Lovenox to be stopped if labor progresses and 24 hours before epidural placement.    [place], [place] 03/10/2018 8:54 AM Late Entry   Dr Mora Appl Updated on POC and pt status.

## 2018-03-11 NOTE — Progress Notes (Addendum)
Hospital day # 19-Bridget Mcbride is a 28 y.o. female, G1P0 at [redacted]w[redacted]d  Subjective:   Patient denies any questions at this time. She denies spotting, cramping, loss of fluid, or contractions. She reports good fetal movement. Discussed plan for reevaluation of fetal growth and positioning on Thursday and possible discharge afterward if she remains stable and desires discharge after results of ultrasound. Per MFM, patient can be discharged if she has not made cervical change and NST is reactive.   Objective: Vitals:   03/09/18 1008 03/09/18 2121 03/10/18 0942 03/10/18 2200  BP: 111/72 122/90 122/85 121/74  Pulse: 88 100 92 100  Resp: 18 18 18 18   Temp: 98.3 F (36.8 C) 98.9 F (37.2 C) 99.1 F (37.3 C) 99 F (37.2 C)  TempSrc: Oral Oral Oral Oral  SpO2: 100% 100% 100% 99%  Weight:      Height:         No results found for this or any previous visit (from the past 24 hour(s)).   Physical Exam  Constitutional: She is oriented to person, place, and time and well-developed, well-nourished, and in no distress.  HENT:  Head: Normocephalic and atraumatic.  Eyes: Pupils are equal, round, and reactive to light.  Pulmonary/Chest: Effort normal. No respiratory distress.  Musculoskeletal: Normal range of motion.  Neurological: She is alert and oriented to person, place, and time.  Skin: Skin is warm and dry.  Psychiatric: Mood, memory, affect and judgment normal.  Vitals reviewed.  FHTs: Last night-140s, moderate beat to beat variability, non-reactive with occasional mild decels expected for gestational age. Consistent with patient's recent tracings.  Dilation: 4.5 Effacement (%): 100 Presentation: Undeterminable Exam by:: [name] CNM   Assessment/Plan: 28 y.o. 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 Retest shows -RPR and reflex Treponemal Antibody IgG. Initial result was a false positive likely due to cross-contamination and lab error  History of DVT, on Lovenox 80mg  QD and wearing SCDs Growth ultrasound Thursday, add-on BPP if NST is nonreactive   [name] 03/11/2018 8:55 AM

## 2018-03-12 LAB — TYPE AND SCREEN
ABO/RH(D): B POS
Antibody Screen: NEGATIVE

## 2018-03-12 NOTE — Progress Notes (Addendum)
Hospital day # 20-Bridget Mcbride is a 28 y.o. female, G1P0 at [redacted]w[redacted]d  Subjective:   Patient denies any questions at this time. She denies spotting, cramping, loss of fluid, or contractions. She reports good fetal movement. Discussed plan for reevaluation of fetal growth and positioning on Thursday and possible discharge afterward if she remains stable and desires discharge after results of ultrasound. Per MFM, patient can be discharged if she has not made cervical change and NST is reactive.   Objective: Vitals:   03/10/18 0942 03/10/18 2200 03/11/18 1010 03/11/18 2140  BP: 122/85 121/74 118/77 (!) 114/59  Pulse: 92 100 90 93  Resp: 18 18 18 16   Temp: 99.1 F (37.3 C) 99 F (37.2 C) 98 F (36.7 C) 99.1 F (37.3 C)  TempSrc: Oral Oral Oral Oral  SpO2: 100% 99% 100% 100%  Weight:      Height:         No results found for this or any previous visit (from the past 24 hour(s)).   Physical Exam  Constitutional: She is oriented to person, place, and time and well-developed, well-nourished, and in no distress.  HENT:  Head: Normocephalic and atraumatic.  Eyes: Pupils are equal, round, and reactive to light.  Pulmonary/Chest: Effort normal. No respiratory distress.  Musculoskeletal: Normal range of motion.  Neurological: She is alert and oriented to person, place, and time.  Skin: Skin is warm and dry.  Psychiatric: Mood, memory, affect and judgment normal.  Vitals reviewed.  FHTs: 140s, moderate beat to beat variability, reactive with occasional mild variable decels expected for gestational age. Consistent with patient's recent tracings.  Dilation: 4.5 Effacement (%): 100 Presentation: Undeterminable Exam by:: [name] CNM   Assessment/Plan: 28 y.o. 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 Retest shows -RPR and reflex Treponemal Antibody IgG. Initial result was a false positive likely due to cross-contamination and lab error  History of DVT, on Lovenox 80mg  QD and wearing SCDs Growth ultrasound Thursday, add-on BPP if NST is nonreactive   [name] 03/12/2018 8:27 AM   RN called to report contractions noted on tocometry at 11:15am. At that time patient denied increase in pressure and could not feel contractions. The contractions were irregular and came approximately 10 minutes apart but she would go through long stretches without contractions as well. On evaluating her now, her contractions are closer together (q5 minutes) and patient is starting to feel tightening with the contractions. Earlier, patient declined procardia but after discussing it with her she will take it now. Patient has been PO hydrating. Will reassess patient status after procardia given. If patient continues to have contractions which are increasing in frequency and strength a vaginal exam may be indicated as well as a possible move to L&D. Dr. Normand Sloop aware of all changes in patient status.   [name]  03/12/18 1:06 PM  Dr. Normand Sloop and I went to see patient. Her contractions have spaced out since the procardia but she reports continued tightness with contractions. Baby is vertex by bedside ultrasound. Dr. Normand Sloop completed a cervical exam and noted she had not made change. The plan was made to give Procardia again at 1900  and continue to give PRN for contractions. If patient began to labor, plans to restart MgSO4 would be made and patient would be transferred downstairs. Will continue to monitor closely.   [name] 03/12/18 1500

## 2018-03-13 ENCOUNTER — Inpatient Hospital Stay (HOSPITAL_BASED_OUTPATIENT_CLINIC_OR_DEPARTMENT_OTHER): Payer: BLUE CROSS/BLUE SHIELD

## 2018-03-13 DIAGNOSIS — O2233 Deep phlebothrombosis in pregnancy, third trimester: Secondary | ICD-10-CM

## 2018-03-13 DIAGNOSIS — O26873 Cervical shortening, third trimester: Secondary | ICD-10-CM

## 2018-03-13 DIAGNOSIS — Z3A30 30 weeks gestation of pregnancy: Secondary | ICD-10-CM | POA: Diagnosis not present

## 2018-03-13 NOTE — Progress Notes (Signed)
Hospital day # 21-Bridget Mcbride is a 28 y.o. female, G1P0 at [redacted]w[redacted]d  Subjective:   Patient denies any questions at this time. She denies spotting, cramping, loss of fluid, or contractions. She reports good fetal movement. Discussed plan for reevaluation of fetal growth and positioning today and possible discharge afterward if she remains stable and desires discharge after results of ultrasound. Patient understands that she would have to be free from contractions to qualify as stable. Per MFM, patient can be discharged if she has not made cervical change and NST is reactive.   Objective: Vitals:   03/11/18 2140 03/12/18 0951 03/12/18 2201 03/13/18 1114  BP: (!) 114/59 132/79 117/73 126/85  Pulse: 93 87 86 92  Resp: 16 18 18 15   Temp: 99.1 F (37.3 C) 99 F (37.2 C) 98.7 F (37.1 C) 98.6 F (37 C)  TempSrc: Oral Oral Oral Oral  SpO2: 100%  100% 100%  Weight:      Height:         No results found for this or any previous visit (from the past 24 hour(s)).   Physical Exam  Constitutional: She is oriented to person, place, and time and well-developed, well-nourished, and in no distress.  HENT:  Head: Normocephalic and atraumatic.  Eyes: Pupils are equal, round, and reactive to light.  Pulmonary/Chest: Effort normal. No respiratory distress.  Musculoskeletal: Normal range of motion.  Neurological: She is alert and oriented to person, place, and time.  Skin: Skin is warm and dry.  Psychiatric: Mood, memory, affect and judgment normal.  Vitals reviewed.  FHTs: 130s, moderate beat to beat variability, reactive with occasional mild variable decels expected for gestational age. Consistent with patient's recent tracings.  Dilation: 4 Effacement (%): 50 Presentation: Vertex(confirmed by bedside US per Dr. Normand Sloop) Exam by:: Dr. Normand Sloop  Assessment/Plan: 28 y.o. 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 Retest shows -RPR and reflex Treponemal Antibody IgG. Initial result was a false positive likely due to cross-contamination and lab error  History of DVT, on Lovenox 80mg  QD and wearing SCDs Growth ultrasound today, results pending    [name] 03/13/2018 11:19 AM

## 2018-03-13 NOTE — Progress Notes (Signed)
Pt taken off continuous monitor. Pt has no complaint of contractions or vaginal bleeding. FHR stable 135 with acels. Still awaiting ultrasound. Carmelina Dane, RN

## 2018-03-14 NOTE — Progress Notes (Signed)
Pt requesting Procardia after feeling one contraction.  Placed on monitor to track pattern.  Pt agrees to wait a bit to see if contractions continue.

## 2018-03-14 NOTE — Progress Notes (Addendum)
Hospital day # 22-Bridget Mcbride is a 28 y.o. female, G1P0 at 242w4d for incompetence cervix.  Subjective:   Patient resting in bed in NAD, endorses wanting to remain in house until fetus is 4 poounds.  Per MFM, patient is a candidate for discharge if she has not made cervical change and baby is reactive and Bridget Mcbride is aware but Bridget Mcbride would still like to stay to be monitored. Bridget Mcbride in good spirits today, denies cxt now. X1 day ago had cxt Q4115mins resolved with procardia PRN, no cervical change was noted by MD. Bridget Mcbride endorses having mild cxt this morning for a short period of time, was turned to her left side which resolved the uterine cxt.  Bridget Mcbride denies leakage of fluid or vaginal bleeding, Bridget Mcbride endorses +FM. Denies cxt now.   Objective: Vitals:   03/12/18 0951 03/12/18 2201 03/13/18 1114 03/13/18 2146  BP: 132/79 117/73 126/85 117/69  Pulse: 87 86 92 91  Resp: 18 18 15    Temp: 99 F (37.2 C) 98.7 F (37.1 C) 98.6 F (37 C) 98.6 F (37 C)  TempSrc: Oral Oral Oral Oral  SpO2:  100% 100% 98%  Weight:      Height:         No results found for this or any previous visit (from the past 24 hour(s)).  Physical Exam  Constitutional: She is oriented to person, place, and time and well-developed, well-nourished, and in no distress.  HENT:  Head: Normocephalic and atraumatic.  Eyes: Pupils are equal, round, and reactive to light.  Pulmonary/Chest: Effort normal. No respiratory distress.  Musculoskeletal: Normal range of motion.  Neurological: She is alert and oriented to person, place, and time.  Skin: Skin is warm and dry. Abdomen: Soft, non-tender, gravida equal to dates.   Psychiatric: Mood, memory, affect and judgment normal.  Vitals reviewed.  FHTs: NST was baseline 140, moderate beat to beat variability, +acels 10x10,-decels. Reactive, consistent with gestational age and overall reassuring. Occ variable noted which is consistent with GA.   Cervical check was deferred, last exam was on  03/13/2018 Dilation: 4 Effacement (%): 50 Presentation: Vertex(confirmed by bedside US per Dr. Normand Sloopillard) Exam by:: Dr. Normand Sloopillard  Assessment/Plan: 28 y.o. 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  Bridget Mcbride stable.   FWB: Intermittently reactive with occasional variable decels   S/P BMZ 12/19-12/20 and 1/3-1/4, GBS negative. Growth scan 01/23 2.2lbs 20%. Bridget Mcbride she will be comfortable going home if fetus is 4 pounds.   Elevated 1 H GTT: 1hr GTT resulted with 164 likely due to steroids, repeated 1 hr GTT on 1/12, resulted 135. Threshold for CCOB for GDm is above 135.   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    History of DVT: on Lovenox 80mg  QD and wearing SCDs: Lovenox to be stopped if labor progresses and 24 hours before epidural placement.    Bridget Mcbride CNM, [place] 03/14/2018 8:03 AM  Dr Sallye OberKulwa Updated on POC and Bridget Mcbride status.  Attestation of Attending Supervision of Advanced Practitioner (CNM/NP): Evaluation and management procedures were performed by the Advanced Practitioner under my supervision and collaboration.  I have reviewed the Advanced Practitioner's note and chart, and I agree with the  management and plan.  I recommend 3 hr GTT to check for gestational DM.  I reviewed NST at 1500: BL 150, mod variability, reactive, occasional variable decelerations. TOCO: Irregular contractions, 2 of them 9 minutes apart in a 1 hr span. Dr. [name].  03/14/18.  1539.

## 2018-03-14 NOTE — Progress Notes (Signed)
Pt has felt pressure one time in last 30 minutes.  One possible contraction seen on EFM.  Pt requests to stay on EFM until 4pm and then go off if everything is OK.  Leaving monitor on for now.

## 2018-03-15 NOTE — Progress Notes (Signed)
Pt wanting to know why she has to have 3 hr GTT, discussed option with Harriett Sine will discuss with pt .

## 2018-03-15 NOTE — Progress Notes (Signed)
Discussed options with pt and pt refused to have her 3 hr GTT. Bernerd Pho notified no new orders.

## 2018-03-15 NOTE — Progress Notes (Signed)
Hospital day # 23 pregnancy at [redacted]w[redacted]d--incompetent cervix stable.  S: Denies contractions today.  No spotting.  Denies problems.  Plan on staying in house until 32 weeks and than reevaluating status.        Perception of contractions: none      Vaginal bleeding: none now       Vaginal discharge:  no significant change  O: BP 138/89 (BP Location: Right Arm)   Pulse 99   Temp 99 F (37.2 C) (Oral)   Resp 16   Ht 5\' 4"  (1.626 m)   Wt 67.8 kg   LMP 08/11/2017 (Exact Date)   SpO2 100%   Breastfeeding Unknown   BMI 25.66 kg/m       Fetal tracings:NST was baseline 140, moderate beat to beat variability, +acels 10x10,-decels. Reactive, consistent with gestational age and overall reassuring. Occ variable noted which is consistent with GA.       Contractions:   None      Uterus gravid and non-tender      Extremities: extremities normal, atraumatic, no cyanosis or edema and no significant edema and no signs of DVT           Meds: MAR  A: [redacted]w[redacted]d with      stable  P: Continue current plan of care      Upcoming tests/treatments:  NST BID      MDs will follow  Bridget Mcbride CNM, MSN 03/15/2018 10:53 AM

## 2018-03-16 NOTE — Progress Notes (Signed)
Hospital day # 24-Bridget Mcbride is a 28 y.o. female, G1P0 at 6589w6d for incompetence cervix.  Subjective:   Patient resting in bed in NAD, endorses wanting to remain in house until fetus is 4 poounds.  Per MFM, patient is a candidate for discharge if she has not made cervical change and baby is reactive and pt is aware but pt would still like to stay to be monitored. Pt endorses she has seen a little of blood when she wipes in the morning for past 2 days mixed with mucous, pt endorses having cxt on and off, but nothing that has shown up on the monitor, denies refused to allow me to check her. I reviewed reason as to why it is important to be checked if she is having vaginal bleeding and cxt, if pt labor neuroprotectent is needed for the infant, pt stated I can come back in thirty mins to check her after she showers. I consulted with MD Dion BodyVarnado, and reviewed the strip which showed no cxt this morning, and there isno active bleeding now, We agreed that we can forgo the vaginal exam but I stressed the importance that the pt needs to report increased vaginal b.leeding and all cxt she feels she will be placed on the monitor, if pt has cxt and bleeding and appears to be laboring ZI will check her and performed bedside US to verify if fetus is vertex. +FM, no leakage of fluid reported.   Objective: Vitals:   03/14/18 2130 03/15/18 1012 03/15/18 2203 03/16/18 1100  BP: 129/80 138/89 117/79 134/77  Pulse: 85 99 84 99  Resp: 17 16 16 18   Temp: 99 F (37.2 C) 99 F (37.2 C) 99.3 F (37.4 C) 99.4 F (37.4 C)  TempSrc: Oral Oral Oral Oral  SpO2: 99% 100% 100%   Weight:      Height:         No results found for this or any previous visit (from the past 24 hour(s)).  Physical Exam  Constitutional: She is oriented to person, place, and time and well-developed, well-nourished, and in no distress.  HENT:  Head: Normocephalic and atraumatic.  Eyes: Pupils are equal, round, and reactive to light.   Pulmonary/Chest: Effort normal. No respiratory distress.  Musculoskeletal: Normal range of motion.  Neurological: She is alert and oriented to person, place, and time.  Skin: Skin is warm and dry. Abdomen: Soft, non-tender, gravida equal to dates.   Psychiatric: Mood, memory, affect and judgment normal.  Vitals reviewed.  FHTs: NST was baseline 140, moderate beat to beat variability, +acels 10x10,-decels. Reactive, consistent with gestational age and overall reassuring. Occ variable noted which is consistent with GA.   No cxt noted on toco today  Cervical check was deferred, last exam was on 03/13/2018: Pt refused vaginal exam today.  Dilation: 4 Effacement (%): 50 Presentation: Vertex(confirmed by bedside US per Dr. Normand Sloopillard) Exam by:: Dr. Normand Sloopillard  Assessment/Plan: 28 y.o. G1P0 at 6689w6d, HD# 24, Incompetent Cervix: 4cm, fully effaced, morning spotting/bleeding with random cxt felt, non of which showed up on toco today, Vertex with bedside US, 03/13/2018.  Pt stable.   FWB: Intermittently reactive with occasional variable decels, NST TID, Growth scan in 2.5 weeks   S/P BMZ 12/19-12/20 and 1/3-1/4, GBS negative. Growth scan 01/23 2.2lbs 20%. Pt stated she will be comfortable going home if fetus is 4 pounds. If pt labors plan to start magnesium for neuroprotectant, transfer to LD.  Elevated 1 H GTT: 1hr GTT resulted with  164 likely due to steroids, repeated 1 hr GTT on 1/12, resulted 135. Threshold for CCOB for GDm is above 135. Dr [name] wanted 3H GTT, pt refused test and refused all BS check.   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    History of DVT: on Lovenox 80mg  QD and wearing SCDs:  Lovenox to be stopped if labor progresses and 24 hours before epidural placement.   Dr Dion Body aware of pt status and verbalized understanding.    [facility] CNM, [place] 03/16/2018 1:08 PM

## 2018-03-16 NOTE — Progress Notes (Signed)
Pt given sleeping pill and doesn't want to be disturbed or for anyone to go into her room so she can get some sleep per pt request.

## 2018-03-17 NOTE — Progress Notes (Addendum)
Hospital day # 25-Bridget Mcbride is a 28 y.o. female, G1P0 at 31w  Subjective:   Patient denies any questions at this time. She denies spotting, cramping, or contractions. Per MFM, patient can be discharged if she has not made cervical change and NST is reactive but patient desires to stay in-patient until baby is 4lbs. Toco did not show any UCs last night. Will continue to monitor. Vaginal exams to be deferred unless patient reports increased pressure or contractions.   Objective: Vitals:   03/15/18 1012 03/15/18 2203 03/16/18 1100 03/16/18 2133  BP: 138/89 117/79 134/77 128/73  Pulse: 99 84 99 87  Resp: 16 16 18 17   Temp: 99 F (37.2 C) 99.3 F (37.4 C) 99.4 F (37.4 C) 99.4 F (37.4 C)  TempSrc: Oral Oral Oral Oral  SpO2: 100% 100%  99%  Weight:      Height:         No results found for this or any previous visit (from the past 24 hour(s)).   Physical Exam  Constitutional: She is oriented to person, place, and time and well-developed, well-nourished, and in no distress.  HENT:  Head: Normocephalic and atraumatic.  Eyes: Pupils are equal, round, and reactive to light.  Pulmonary/Chest: Effort normal. No respiratory distress.  Musculoskeletal: Normal range of motion.  Neurological: She is alert and oriented to person, place, and time.  Skin: Skin is warm and dry.  Psychiatric: Mood, memory, affect and judgment normal.  Vitals reviewed.  FHTs from last night: 140s, moderate beat to beat variability, nonreactive with occasional mild variable decels, consistent with patient's previous FHTs. No contractions noted.  Dilation: 4 Effacement (%): 50 Presentation: Vertex(confirmed by bedside US per Dr. Normand Sloop) Exam by:: Dr. Normand Sloop  Assessment/Plan: 28 y.o. 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 Retest shows -RPR and reflex Treponemal Antibody IgG. Initial result was a false positive likely due to cross-contamination and lab error  History of DVT, on Lovenox 80mg  QD and wearing SCDs Growth ultrasound 04/03/18  [name] 03/17/2018 9:43 AM   RN called to report patient was now complaining of continued spotting and cramping. I reassessed her, she did not appear to be in any distress. She reports she has been spotting in the morning x4 days but does note she had some spotting yesterday afternoon. She reports intermittent mild cramping in her lower abdomen but denies feeling tightening like contractions. She reports good fetal movement. On monitor, toco shows no uterine contractions at this time, possibly some minor uterine irritability. Patient to remain on continuous monitoring and to be given PRN procardia for the cramping, she was also encouraged to PO hydrate. Will continue to monitor. Patient will be given cervical exam and transferred to L&D if she begins to labor.   [name] 03/17/18 9:43 AM  Per RN, patient given procardia and tylenol and no longer reports irregular cramping. Patient up to bathroom without spotting noted. Patient returned to NST TID monitoring unless symptoms return. Will continue to monitor.   [name] 03/17/18 11:16 AM

## 2018-03-17 NOTE — Progress Notes (Signed)
Pt reporting vaginal spotting and abdominal cramping. Pt placed on EFM. Abdomen soft on palpation. Denies ROM. Fetus active. [name], CNM notified of the above. Instructed to offer the patient Procardia. Carmelina Dane, RN

## 2018-03-18 MED ORDER — HYDROXYZINE HCL 50 MG PO TABS
50.0000 mg | ORAL_TABLET | Freq: Three times a day (TID) | ORAL | Status: DC | PRN
  Administered 2018-03-18: 50 mg via ORAL
  Filled 2018-03-18 (×2): qty 1

## 2018-03-18 MED ORDER — NIFEDIPINE 10 MG PO CAPS
10.0000 mg | ORAL_CAPSULE | Freq: Four times a day (QID) | ORAL | Status: DC
  Administered 2018-03-18: 10 mg via ORAL
  Filled 2018-03-18: qty 1

## 2018-03-18 NOTE — Progress Notes (Addendum)
At 0450 pt called RN with c/o Cx awakening her from sleep. She was given PRN Procardia and prophylactic Tylenol d/t past headaches with Procardia. Toco applied at that time. Pt is contracting every 5-11 min. U/S applied at 0605. Monitors removed @ 0630 d/t good FHR and cessation of Cx.

## 2018-03-18 NOTE — Progress Notes (Signed)
Pt states cx are more frequent than RN is assessing with the toco. Pt states she feels them approx every 8 min, and requested to stay on the monitor at this time.

## 2018-03-18 NOTE — Progress Notes (Signed)
Hospital day # 26 pregnancy at [redacted]w[redacted]d--incompetent cervix.  S: Pt states contracted last night.  No contractions now. Did have some bright red bleeding when wiping today.      Perception of contractions: none      Vaginal bleeding: spotting       Vaginal discharge:  Spotting bright red,   O: BP 126/74 (BP Location: Left Arm)   Pulse 94   Temp 98.4 F (36.9 C) (Oral)   Resp 18   Ht 5\' 4"  (1.626 m)   Wt 67.8 kg   LMP 08/11/2017 (Exact Date)   SpO2 100%   Breastfeeding Unknown   BMI 25.66 kg/m       Fetal tracings:Cat 1 FHT 135 accels, no decels      Contractions:   None now      Uterus gravid, consistent with 31 weeks and non-tender      Extremities: extremities normal, atraumatic, no cyanosis or edema and no significant edema and no signs of DVT          Labs:  None today       Meds: Procardia 10 mg prn  A: [redacted]w[redacted]d with incompetent cervix      stable Cat 1 strip P: Continue current plan of care      Upcoming tests/treatments:  No changes      MDs will follow  Kenney Houseman CNM, MSN 03/18/2018 10:36 AM

## 2018-03-19 ENCOUNTER — Encounter (HOSPITAL_COMMUNITY): Payer: Self-pay | Admitting: *Deleted

## 2018-03-19 ENCOUNTER — Inpatient Hospital Stay (HOSPITAL_COMMUNITY): Payer: BLUE CROSS/BLUE SHIELD

## 2018-03-19 LAB — CBC
HCT: 39.4 % (ref 36.0–46.0)
Hemoglobin: 13.3 g/dL (ref 12.0–15.0)
MCH: 29.4 pg (ref 26.0–34.0)
MCHC: 33.8 g/dL (ref 30.0–36.0)
MCV: 87.2 fL (ref 80.0–100.0)
Platelets: 233 10*3/uL (ref 150–400)
RBC: 4.52 MIL/uL (ref 3.87–5.11)
RDW: 12.6 % (ref 11.5–15.5)
WBC: 12.1 10*3/uL — AB (ref 4.0–10.5)
nRBC: 0 % (ref 0.0–0.2)

## 2018-03-19 LAB — RPR: RPR: NONREACTIVE

## 2018-03-19 LAB — TYPE AND SCREEN
ABO/RH(D): B POS
Antibody Screen: NEGATIVE

## 2018-03-19 MED ORDER — LACTATED RINGERS IV SOLN: 500.0000 mL | INTRAVENOUS | Status: DC | PRN

## 2018-03-19 MED ORDER — ZOLPIDEM TARTRATE 5 MG PO TABS: 5.0000 mg | ORAL_TABLET | Freq: Every evening | ORAL | Status: DC | PRN

## 2018-03-19 MED ORDER — SOD CITRATE-CITRIC ACID 500-334 MG/5ML PO SOLN: 30.0000 mL | ORAL | Status: DC | PRN

## 2018-03-19 MED ORDER — ONE-A-DAY WOMENS PRENATAL 1 28-0.8-235 MG PO CAPS
1.0000 | ORAL_CAPSULE | Freq: Every day | ORAL | Status: DC
  Administered 2018-03-20: 1 via ORAL

## 2018-03-19 MED ORDER — WITCH HAZEL-GLYCERIN EX PADS: 1.0000 "application " | MEDICATED_PAD | CUTANEOUS | Status: DC | PRN

## 2018-03-19 MED ORDER — TETANUS-DIPHTH-ACELL PERTUSSIS 5-2.5-18.5 LF-MCG/0.5 IM SUSP: 0.5000 mL | Freq: Once | INTRAMUSCULAR | Status: DC

## 2018-03-19 MED ORDER — LACTATED RINGERS IV SOLN
INTRAVENOUS | Status: DC
  Administered 2018-03-19 (×2): via INTRAVENOUS

## 2018-03-19 MED ORDER — ONDANSETRON HCL 4 MG/2ML IJ SOLN: 4.0000 mg | Freq: Four times a day (QID) | INTRAMUSCULAR | Status: DC | PRN

## 2018-03-19 MED ORDER — SIMETHICONE 80 MG PO CHEW: 80.0000 mg | CHEWABLE_TABLET | ORAL | Status: DC | PRN

## 2018-03-19 MED ORDER — OXYTOCIN BOLUS FROM INFUSION
500.0000 mL | Freq: Once | INTRAVENOUS | Status: AC
  Administered 2018-03-19: 500 mL via INTRAVENOUS

## 2018-03-19 MED ORDER — MAGNESIUM SULFATE 40 G IN LACTATED RINGERS - SIMPLE
2.0000 g/h | INTRAVENOUS | Status: DC
  Administered 2018-03-19: 2 g/h via INTRAVENOUS
  Filled 2018-03-19: qty 500

## 2018-03-19 MED ORDER — DIPHENHYDRAMINE HCL 25 MG PO CAPS: 25.0000 mg | ORAL_CAPSULE | Freq: Four times a day (QID) | ORAL | Status: DC | PRN

## 2018-03-19 MED ORDER — PRENATAL MULTIVITAMIN CH
1.0000 | ORAL_TABLET | Freq: Every day | ORAL | Status: DC
  Administered 2018-03-19: 1 via ORAL
  Filled 2018-03-19: qty 1

## 2018-03-19 MED ORDER — OXYCODONE-ACETAMINOPHEN 5-325 MG PO TABS
2.0000 | ORAL_TABLET | ORAL | Status: DC | PRN
  Administered 2018-03-19: 2 via ORAL
  Filled 2018-03-19: qty 2

## 2018-03-19 MED ORDER — FENTANYL CITRATE (PF) 100 MCG/2ML IJ SOLN
100.0000 ug | Freq: Once | INTRAMUSCULAR | Status: AC
  Administered 2018-03-19: 100 ug via INTRAVENOUS
  Filled 2018-03-19: qty 2

## 2018-03-19 MED ORDER — OXYCODONE-ACETAMINOPHEN 5-325 MG PO TABS: 1.0000 | ORAL_TABLET | ORAL | Status: DC | PRN

## 2018-03-19 MED ORDER — SENNOSIDES-DOCUSATE SODIUM 8.6-50 MG PO TABS
2.0000 | ORAL_TABLET | ORAL | Status: DC
  Administered 2018-03-19 – 2018-03-21 (×2): 2 via ORAL
  Filled 2018-03-19 (×2): qty 2

## 2018-03-19 MED ORDER — COCONUT OIL OIL
1.0000 "application " | TOPICAL_OIL | Status: DC | PRN
  Administered 2018-03-19: 1 via TOPICAL
  Filled 2018-03-19: qty 120

## 2018-03-19 MED ORDER — FAMOTIDINE 20 MG PO TABS
20.0000 mg | ORAL_TABLET | Freq: Two times a day (BID) | ORAL | Status: DC
  Administered 2018-03-19 – 2018-03-21 (×5): 20 mg via ORAL
  Filled 2018-03-19 (×6): qty 1

## 2018-03-19 MED ORDER — ONDANSETRON HCL 4 MG/2ML IJ SOLN: 4.0000 mg | INTRAMUSCULAR | Status: DC | PRN

## 2018-03-19 MED ORDER — BENZOCAINE-MENTHOL 20-0.5 % EX AERO
1.0000 "application " | INHALATION_SPRAY | CUTANEOUS | Status: DC | PRN
  Administered 2018-03-19: 1 via TOPICAL
  Filled 2018-03-19: qty 56

## 2018-03-19 MED ORDER — LIDOCAINE HCL (PF) 1 % IJ SOLN
30.0000 mL | INTRAMUSCULAR | Status: DC | PRN
  Filled 2018-03-19: qty 30

## 2018-03-19 MED ORDER — ONDANSETRON HCL 4 MG PO TABS: 4.0000 mg | ORAL_TABLET | ORAL | Status: DC | PRN

## 2018-03-19 MED ORDER — PROMETHAZINE HCL 25 MG/ML IJ SOLN: 12.5000 mg | Freq: Four times a day (QID) | INTRAMUSCULAR | Status: DC | PRN

## 2018-03-19 MED ORDER — ACETAMINOPHEN 325 MG PO TABS
650.0000 mg | ORAL_TABLET | ORAL | Status: DC | PRN
  Administered 2018-03-21: 650 mg via ORAL
  Filled 2018-03-19: qty 2

## 2018-03-19 MED ORDER — DIBUCAINE 1 % RE OINT: 1.0000 "application " | TOPICAL_OINTMENT | RECTAL | Status: DC | PRN

## 2018-03-19 MED ORDER — FENTANYL CITRATE (PF) 100 MCG/2ML IJ SOLN
75.0000 ug | INTRAMUSCULAR | Status: DC | PRN
  Administered 2018-03-19: 100 ug via INTRAVENOUS
  Filled 2018-03-19: qty 2

## 2018-03-19 MED ORDER — MAGNESIUM SULFATE BOLUS VIA INFUSION
4.0000 g | Freq: Once | INTRAVENOUS | Status: AC
  Administered 2018-03-19: 4 g via INTRAVENOUS
  Filled 2018-03-19: qty 500

## 2018-03-19 MED ORDER — MISOPROSTOL 200 MCG PO TABS
ORAL_TABLET | ORAL | Status: AC
  Filled 2018-03-19: qty 5

## 2018-03-19 MED ORDER — OXYTOCIN 40 UNITS IN NORMAL SALINE INFUSION - SIMPLE MED
2.5000 [IU]/h | INTRAVENOUS | Status: DC
  Filled 2018-03-19: qty 1000

## 2018-03-19 MED ORDER — IBUPROFEN 600 MG PO TABS
600.0000 mg | ORAL_TABLET | Freq: Four times a day (QID) | ORAL | Status: DC
  Administered 2018-03-19 – 2018-03-21 (×8): 600 mg via ORAL
  Filled 2018-03-19 (×8): qty 1

## 2018-03-19 NOTE — Progress Notes (Signed)
Patient ID: Bridget Mcbride, female   DOB: 09-Aug-1990, 28 y.o.   MRN: [id number]  Asked to do limited bedside US for fetal position. Baby cephalic.  Levie Heritage, DO 03/19/2018 6:40 AM

## 2018-03-19 NOTE — Progress Notes (Signed)
Labor Progress Note Hospital day # 27 pregnancy at [redacted]w[redacted]d--cervical incompetence. Pt is on L& D now, s/p Magnesium sulfate bolus 0352, s/p fentanyl x 2, painful ctxns 10/10 on the pain scale, using nitrous oxide for pain without relief. Unable to receive epidural because of treatment dose of Lovenox 80mg  last night at 2103. Per anesthesia, epidural would need to be 24h after last dose. Cephalic by BSUS.  Subjective: Bridget Mcbride is tearful, hurting.  Objective:  BP (!) 105/50   Pulse 95   Temp 98.3 F (36.8 C) (Oral)   Resp 18   Ht 5\' 4"  (1.626 m)   Wt 67.8 kg   LMP 08/11/2017 (Exact Date)   SpO2 100%   Breastfeeding Unknown   BMI 25.66 kg/m     FHT: Category 2  Variability minimal at times (on mag), occasional mild variables).  UC:   q 2-3 minutes, lasting 60 seconds SVE:   Dilation: 9 Effacement (%): 100 Station: 0 Exam by:: Jonetta Speak CNM Pitocin at n/a MVUs n/a  Vaginal bleeding, small amount, c/w partial abruption.  Assessment/Plan:  Fetal Wellbeing:  Cat 2 Labor: PTL progressing Preeclampsia:  na GBS:    neg Pain Control:  nitrous Anticipated MOD:  SVD  Dr. Normand Sloop updated.  Jonetta Speak, CNM 03/19/2018, 6:39 AM

## 2018-03-19 NOTE — Progress Notes (Addendum)
Hospital day # 27 pregnancy at [redacted]w[redacted]d--cervical incompetence. Pt began contracting q 5 minutes around 0015. Came to check on patient at 0130 and she appeared to be sleeping. Asked RN to please notify me asap if pt awoke having pain. At 0215 RN called to say pt woke up crying, 10/10 pain, having bloody show.  S:  Pt rates ctxns 10/10. She is tearful.      Perception of contractions: regular, every 5 minutes      Vaginal bleeding: wine colored mucus. Pad from 2200 (4hours worth) contained  approximately 5cc.       Vaginal discharge:  Mucousy, red  O: BP 119/76 (BP Location: Right Arm)   Pulse 72   Temp 98.3 F (36.8 C) (Oral)   Resp 18   Ht 5\' 4"  (1.626 m)   Wt 67.8 kg   LMP 08/11/2017 (Exact Date)   SpO2 100%   Breastfeeding Unknown   BMI 25.66 kg/m       Fetal tracings: Category 1, nonreactive      Contractions:   2-3 in 10 minutes      Uterus gravid and firm contractions, painful      Extremities: no significant edema and no signs of DVT         A: [redacted]w[redacted]d with preterm labor     gradually worsening  P:POC made with Dr. Normand Sloop: Place IV Continuous EFM Tx to L&D Magnesium sulfate infusion 4gm bolus, 2gm maintenance x 12h for neuroprotection CBC, type & screen Fentanyl for pain relief Phenergan PRN Strict I/Os Total IFVs to equal 159ml/hr MDs will follow  Jonetta Speak CNM, MSN 03/19/2018 2:45 AM

## 2018-03-20 LAB — CBC
HCT: 35.6 % — ABNORMAL LOW (ref 36.0–46.0)
Hemoglobin: 12 g/dL (ref 12.0–15.0)
MCH: 29.1 pg (ref 26.0–34.0)
MCHC: 33.7 g/dL (ref 30.0–36.0)
MCV: 86.4 fL (ref 80.0–100.0)
Platelets: 226 10*3/uL (ref 150–400)
RBC: 4.12 MIL/uL (ref 3.87–5.11)
RDW: 12.9 % (ref 11.5–15.5)
WBC: 12 10*3/uL — ABNORMAL HIGH (ref 4.0–10.5)
nRBC: 0 % (ref 0.0–0.2)

## 2018-03-20 NOTE — Lactation Note (Signed)
This note was copied from a baby's chart. Lactation Consultation Note  Patient Name: Bridget Mcbride [id number] Date: 03/20/2018 Reason for consult: Initial assessment;Preterm <34wks;Infant < 6lbs  Baby is 49 hours old  Baby is NICU, per mom has pumped x 3 since the DEBP was set up  Yesterday/ none last night. Per mom has been able to do STS with baby and also dad.  LC encouraged to continue STS as directed by NICU staff.  LC reviewed supply and demand and the importance of pumping both breast for 15 -20 mins at least 8- 10  times in 24 hours. Reviewed hand express prior to pumping  And afterwards / save milk to be used in NICU.  LC reassured mom consistent pumping is the key to establish milk supply and protect the level.  LC checked flanges while the mom was pumping and the #24 F was a good fit .  LC reviewed the Settings of the DEBP Symphony pump.  Per mom has DEBP Medela  For after D/C.  Mother informed of post-discharge support and given phone number to the lactation department, including services for phone call assistance; out-patient appointments; and breastfeeding support group. List of other breastfeeding resources in the community given in the handout. Encouraged mother to call for problems or concerns related to breastfeeding.     Maternal Data Has patient been taught Hand Expression?: Yes Does the patient have breastfeeding experience prior to this delivery?: No  Feeding Feeding Type: Donor Breast Milk  LATCH Score                   Interventions Interventions: Breast feeding basics reviewed  Lactation Tools Discussed/Used Tools: Pump;Flanges Flange Size: 24;Other (comment)(per mom comfortable ) Breast pump type: Double-Electric Breast Pump Pump Review: Milk Storage;Setup, frequency, and cleaning Initiated by:: reviewed / MAI  Date initiated:: 03/20/18   Consult Status Consult Status: Follow-up Date: 03/21/18 Follow-up type:  In-patient    Matilde Sprang Yoandri Congrove 03/20/2018, 12:01 PM

## 2018-03-20 NOTE — Progress Notes (Signed)
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 Patient up ad lib, denies syncope or dizziness. Reports consuming regular diet without issues and denies N/V. Patient reports 0 bowel movement + passing flatus.  Denies issues with urination and reports bleeding is "same."  Patient is pump feeding, due to infant in NICU for PTD, infant doing well on RA, and reports going well.  Desires unknown for postpartum contraception.  Pain is being appropriately managed with use of po motrin. Pt smiling and in good spirits about her storey and feeling positive about the infant. No complaints of leg pain, CP, SOB, or active prodrom lesion of HSV.   Superficial left labial not repaired laceration Feeding:  Pumping Contraceptive plan:  Unkw BB: Circ Desires In pt circ when infant ready   Objective: Vital signs in last 24 hours: Patient Vitals for the past 24 hrs:  BP Temp Temp src Pulse Resp SpO2  03/20/18 0614 115/85 98.3 F (36.8 C) - 85 15 -  03/19/18 2207 114/75 98.7 F (37.1 C) Oral 94 15 98 %  03/19/18 2207 114/75 98.7 F (37.1 C) Oral 94 16 100 %  03/19/18 1629 116/75 98.7 F (37.1 C) Oral 90 18 100 %  03/19/18 1306 128/80 99.4 F (37.4 C) Oral (!) 115 18 96 %  03/19/18 1021 116/72 98.8 F (37.1 C) Oral (!) 101 18 98 %  03/19/18 0911 128/76 99.6 F (37.6 C) Oral (!) 102 18 100 %  03/19/18 0831 119/75 - - 99 18 -  03/19/18 0816 114/72 - - 95 16 -  03/19/18 0801 123/73 - - 97 18 -  03/19/18 0747 (!) 118/59 - - 97 18 -  03/19/18 0741 112/73 - - (!) 103 - -  03/19/18 0700 92/62 - - 99 20 -     Physical Exam:  General: alert, cooperative, appears stated age and no distress Mood/Affect: Happy Lungs: clear to auscultation, no wheezes, rales or rhonchi, symmetric air entry.  Heart: normal rate, regular rhythm, normal S1, S2, no murmurs, rubs, clicks or gallops. Breast: breasts appear normal, no suspicious masses, no skin or  nipple changes or axillary nodes. Abdomen:  + bowel sounds, soft, non-tender GU: perineum approximate and intact, healing well. No signs of external hematomas.  Uterine Fundus: firm Lochia: appropriate Skin: Warm, Dry. DVT Evaluation: No evidence of DVT seen on physical exam. Negative Homan's sign. No cords or calf tenderness. No significant calf/ankle edema.  CBC Latest Ref Rng & Units 03/20/2018 03/19/2018 02/20/2018  WBC 4.0 - 10.5 K/uL 12.0(H) 12.1(H) 9.6  Hemoglobin 12.0 - 15.0 g/dL 88.8 91.6 94.5  Hematocrit 36.0 - 46.0 % 35.6(L) 39.4 43.8  Platelets 150 - 400 K/uL 226 233 225    Results for orders placed or performed during the hospital encounter of 02/20/18 (from the past 24 hour(s))  CBC     Status: Abnormal   Collection Time: 03/20/18  6:04 AM  Result Value Ref Range   WBC 12.0 (H) 4.0 - 10.5 K/uL   RBC 4.12 3.87 - 5.11 MIL/uL   Hemoglobin 12.0 12.0 - 15.0 g/dL   HCT 03.8 (L) 88.2 - 80.0 %   MCV 86.4 80.0 - 100.0 fL   MCH 29.1 26.0 - 34.0 pg   MCHC 33.7 30.0 - 36.0 g/dL   RDW 34.9 17.9 - 15.0 %   Platelets 226 150 - 400 K/uL   nRBC 0.0 0.0 - 0.2 %  CBG (last 3)  No results for input(s): GLUCAP in the last 72 hours.   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 No s/sx of DVT, or PE, no active HSV lesions. Pt stable. -2 involution. Pump feeding for infant in NICU. Hemodynamically stable, with hgb of 13.3-12. Pt stable and visiting NICU.   Plan: Continue other mgmt as ordered VTE prophylactics: Early ambulated as tolerates, lovenox 80mg  daily and plans to continue out pt per hematology for 6 weeks. .  Pain control: Motrin/Tylenol PRN Education given regarding options for contraception, including barrier methods, injectable contraception, IUD placement, oral contraceptives.  Plan for discharge tomorrow, Lactation consult and  Circumcision prior to discharge   Dr. Estanislado Pandy to be updated on patient status @ 0700  [facility] NP-C, CNM 03/20/2018, 6:46 AM

## 2018-03-21 MED ORDER — IBUPROFEN 600 MG PO TABS: 600.0000 mg | ORAL_TABLET | Freq: Four times a day (QID) | ORAL | 0 refills | Status: DC

## 2018-03-21 NOTE — Discharge Summary (Signed)
OB Discharge Summary     Patient Name: Bridget Mcbride DOB: 1990/11/14 MRN: [id number]  Date of admission: 02/20/2018 Delivering MD: Janeece RiggersGREER, ELLIS K   Date of discharge: 03/21/2018  Admitting diagnosis: 27 WKS, SPOTTING Intrauterine pregnancy: 7050w2d     Secondary diagnosis:  Active Problems:   Incompetent cervix  Additional problems: History of DVT     Discharge diagnosis: Preterm Pregnancy Delivered                                                                                                Post partum procedures:None  Augmentation: None  Complications: None  Hospital course:  Onset of Labor With Vaginal Delivery     28 y.o. yo G1P0101 at 2350w2d was admitted with advance dilation due to an incompetent cervix on 02/20/2018. Pt was in the hospital 27 days.  Pt started labor on 03/19/2018 with  Membrane Rupture Time/Date: 7:29 AM ,03/19/2018   Intrapartum Procedures: Episiotomy: None [1]                                         Lacerations:  Labial [10]  Patient had a delivery of a Viable infant. 03/19/2018  Information for the patient's newborn:  Bridget Mcbride, Bridget Mcbride [[id number]]  Delivery Method: Vaginal, Spontaneous(Filed from Delivery Summary)    Pateint had an uncomplicated postpartum course.  She is ambulating, tolerating a regular diet, passing flatus, and urinating well. Patient is discharged home in stable condition on 03/21/18.   Physical exam  Vitals:   03/20/18 1642 03/20/18 1959 03/21/18 0500 03/21/18 0814  BP: 127/71 117/76 116/85 109/88  Pulse: 91 87 78 84  Resp: 18 17 17 20   Temp: 98.7 F (37.1 C) 98.9 F (37.2 C) 98.9 F (37.2 C) 98.8 F (37.1 C)  TempSrc: Oral Oral Oral Oral  SpO2: 100% 100% 100% 100%  Weight:      Height:       General: alert, cooperative and no distress Lochia: appropriate Uterine Fundus: firm Incision: N/A DVT Evaluation: No evidence of DVT seen on physical exam. Labs: Lab Results  Component Value Date   WBC 12.0  (H) 03/20/2018   HGB 12.0 03/20/2018   HCT 35.6 (L) 03/20/2018   MCV 86.4 03/20/2018   PLT 226 03/20/2018   CMP Latest Ref Rng & Units 12/03/2016  Glucose 65 - 99 mg/dL 85  BUN 6 - 20 mg/dL 13  Creatinine 5.780.57 - 4.691.00 mg/dL 6.29(B1.01(H)  Sodium 284134 - 132144 mmol/L 138  Potassium 3.5 - 5.2 mmol/L 4.1  Chloride 96 - 106 mmol/L 103  CO2 20 - 29 mmol/L 29  Calcium 8.7 - 10.2 mg/dL 9.4  Total Protein 6.0 - 8.5 g/dL 7.0  Total Bilirubin 0.0 - 1.2 mg/dL 0.3  Alkaline Phos 39 - 117 IU/L 80  AST 0 - 40 IU/L 19  ALT 0 - 32 IU/L 14    Discharge instruction: per After Visit Summary and "Baby and Me Booklet".  After visit meds:  Allergies as of  03/21/2018   No Known Allergies     Medication List    STOP taking these medications   famotidine 20 MG tablet Commonly known as:  PEPCID   progesterone 100 MG capsule Commonly known as:  PROMETRIUM     TAKE these medications   enoxaparin 80 MG/0.8ML injection Commonly known as:  LOVENOX Inject 0.8 mLs (80 mg total) into the skin daily.   ibuprofen 600 MG tablet Commonly known as:  ADVIL,MOTRIN Take 1 tablet (600 mg total) by mouth every 6 (six) hours.   meloxicam 15 MG tablet Commonly known as:  MOBIC Take 1 tablet (15 mg total) by mouth daily.   PRENATAL VITAMIN PO Take by mouth.   valACYclovir 500 MG tablet Commonly known as:  VALTREX Take 500 mg by mouth as needed.       Diet: routine diet  Activity: Advance as tolerated. Pelvic rest for 6 weeks.   Outpatient follow up:4 weeks Follow up Appt: Future Appointments  Date Time Provider Department Center  05/06/2018  8:15 AM Serena CroissantGudena, Vinay[facility]   Follow up Visit:No follow-ups on file.  Postpartum contraception: IUD Paragard  Newborn Data: Live born female  Birth Weight: 3 lb 2.8 oz (1440 g) APGAR: 7, 8  Newborn Delivery   Birth date/time:  03/19/2018 07:35:00 Delivery type:  Vaginal, Spontaneous     Baby Feeding:  Breast Disposition:NICU   03/21/2018 Bridget Mcbride , CNM

## 2018-03-21 NOTE — Lactation Note (Signed)
This note was copied from a baby's chart. Lactation Consultation Note; Follow up visit with this mom of NICU baby born at 76 w 2 d. Mom reports she  pumped 3 times yesterday but is not obtaining any Colostrum. Encouragement given. Encouraged to pump 8 times/24 hours. Has Medela pump for home. No questions at present. To call prn   Patient Name: Bridget Mcbride YBWLS'L Date: 03/21/2018 Reason for consult: Follow-up assessment;NICU baby;Preterm <34wks   Maternal Data Has patient been taught Hand Expression?: Yes Does the patient have breastfeeding experience prior to this delivery?: No  Feeding Feeding Type: Donor Breast Milk  LATCH Score                   Interventions    Lactation Tools Discussed/Used WIC Program: No   Consult Status Consult Status: Follow-up Date: 03/22/18 Follow-up type: In-patient    Pamelia Hoit 03/21/2018, 9:11 AM

## 2018-03-24 ENCOUNTER — Ambulatory Visit: Payer: Self-pay

## 2018-03-24 NOTE — Lactation Note (Signed)
This note was copied from a baby's chart. Lactation Consultation Note  Patient Name: Bridget Mcbride PQAES'L Date: 03/24/2018 Reason for consult: Follow-up assessment;Mother's request;Preterm <34wks;NICU baby;1st time breastfeeding;Infant < 6lbs   Called to NICU to answer mom's questions about pumping and milk supply. Infant is 55 days old and being held STS by FOB.   Mom at bedside pumping with her Medela PIS, she is not obtaining milk at this time. Mom has pumped about 5 cc on one occasion, she reports she does not get anything with hand expression. Mom using # 27 flanges that are too large for mom. Obtained mom and set of # 24 flanges to use to pump. Enc mom to follow pumping with hand expression. Mom reports she has been busy and is pumping about 3 x a day. Reviewed supply and demand and importance of frequently emptying the breast. Enc mom to bring her pump kit from home and to use the Symphony pump when visiting infant and her PIS at home. Enc mom to go no longer than 5 hours between pumping. Mom's breasts are soft and compressible, they are warm to touch. Reviewed engorgement treatment with mom if needed.   Mom reports questions have been answered. Mom to call for assistance as needed.      Maternal Data Has patient been taught Hand Expression?: Yes Does the patient have breastfeeding experience prior to this delivery?: No  Feeding Feeding Type: Donor Breast Milk  LATCH Score                   Interventions    Lactation Tools Discussed/Used Tools: Pump;Flanges Flange Size: 24;27(changed mom to # 24 flanges, she was using # 27 flanges) Pump Review: Setup, frequency, and cleaning Initiated by:: Reviewed and encouraged at least 8 x a day   Consult Status Consult Status: PRN Follow-up type: Call as needed    Donn Pierini 03/24/2018, 2:01 PM

## 2018-03-28 ENCOUNTER — Ambulatory Visit: Payer: Self-pay

## 2018-03-28 NOTE — Lactation Note (Signed)
This note was copied from a baby's chart. Lactation Consultation Note  Patient Name: Bridget Mcbride Bridget Mcbride: 03/28/2018 Reason for consult: Follow-up assessment;Mother's request;NICU baby;1st time breastfeeding;Primapara;Infant < 6lbs;Preterm <34wks  Visited with mom of a 9 days pre-term < 4 lbs NICU female; she called for assistance because she had questions. When LC arrived to the NICU pod, mom was doing STS with baby and she voiced that she was concerned about her milk supply; she's only getting about 5 ml from her right breast and 12 ml from her left one. Her main concerns seems to be about how "uneven" the yield is distributed, not the low yield itself. Explained to mom that the difference between both breasts is only 7 ml and she we were getting larger amounts at this point the difference might not seem significant. But due to the limited amount she's getting, it's noticeable.   Pointed out that the main concern here is her milk production, mom reported she's only been pumping 3-4 times/day. Reviewed supply and demand again with her. She's appropriate and seems to grasp the concept but she's been seeing by 4 different LC's already and still have not increased the frequency of her pumping schedule. Agreed with mom that she can "compromise" to more realistic goals instead of just setting up her alarm clock every 3 hours during the night. She voiced she'll try to increase her pumping schedule, especially after LC spoke to her about the importance of premature milk for the first 4 weeks after delivery.  Feeding plan:  1. Encouraged mom to pump every  2-3 hours during the day and getting 6 hours of sleep at night (from midnight to 6 am). 2. She'll pump first thing in the morning and last thing at night 3. She'll continue turning her EBM to her NICU RN  Unsure at this point if mom is really committed to BF, LC spend a good amount of time providing emotional support and empathy to mom when  she was venting about her sleep deprivation. Mom reported all questions and concerns were answered, she's aware of LC services and will contact PRN.  Maternal Data    Feeding Feeding Type: Breast Milk  Interventions Interventions: Breast feeding basics reviewed;Skin to skin;Breast massage  Lactation Tools Discussed/Used     Consult Status Consult Status: PRN Follow-up type: Call as needed    Bridget Mcbride Sanjiv Castorena 03/28/2018, 4:50 PM

## 2018-04-04 ENCOUNTER — Ambulatory Visit: Payer: Self-pay

## 2018-04-04 NOTE — Lactation Note (Signed)
This note was copied from a baby's chart. Lactation Consultation Note  Patient Name: Bridget Mcbride JJHER'D Date: 04/04/2018    [facility] Follow Up Visit:  RN requested assistance to check flange size for mother:  Mother's breasts are soft and non tender.  Nipples are small, rounded and intact.  Mother stated that she is concerned that her flange size may be too large.  Sized mother for a #21 flange size with no pain.  Mother feels like it feels better than the #24 she was using.  I suggested she observe the smaller flange size and be sure there is no friction and/or rubbing when she pumps.  Educated her on the importance of a correct flange size for good milk supply.  She will use the #24 as needed.  Informed her that she can use coconut oil for comfort.  Also suggested she rub EBM into nipple and areola after pumping which she was not aware of.  She will call for further questions/concerns as she visits baby in NICU.                   Kelcey Korus R Jermon Chalfant 04/04/2018, 4:20 PM

## 2018-04-08 ENCOUNTER — Ambulatory Visit: Payer: Self-pay

## 2018-04-08 NOTE — Lactation Note (Signed)
This note was copied from a baby's chart. Lactation Consultation Note  Patient Name: Bridget Mcbride [id number]'F Date: 04/08/2018 Reason for consult: Follow-up assessment;Mother's request;Preterm <34wks;NICU baby;Primapara;1st time breastfeeding  P1 mother whose infant is now 51 weeks old and in the NICU.  This is a 31+2 weeks baby with a corrected gestational age of 34+1 weeks weighing < 6 lbs  Mother requested breast feeding assistance.  When I arrived baby had just finished having a bath.  Mother and RN were brushing his hair.  He was awake and alert but not showing feeding cues.  Offered to assist with latching and mother accepted.  Mother's breasts are soft and non tender and nipples are small, short shafted and intact.  Assisted baby to latch in the cross cradle hold on the left breast with ease.  Baby had wide gape, flanged lips and began rhythmic sucking.  He needed little encouragement to continue to feed.  Showed mother techniques to keep him awake at the breast when he does start becoming sleepy.  A few audible swallows noted.  Observed him feeding for 13 minutes.  Demonstrated how to break the suction to release baby from breast.  Mother has very long fingernails and I suggested she trim them.  She stated that she had already trimmed them but may need to do some more.  Placed baby STS on mother's chest and he fell asleep.  Gavage feeding initiated by RN when baby began breast feeding.  Encouraged mother to pump with the DEBP prior to leaving the hospital.  Mother had no further questions/concerns at this time.  When asked about her flange size, mother stated the flange size is good and she is having no pain with pumping.  RN updated.   Maternal Data Formula Feeding for Exclusion: No Has patient been taught Hand Expression?: Yes Does the patient have breastfeeding experience prior to this delivery?: No  Feeding Feeding Type: Breast Fed  LATCH Score Latch: Grasps breast easily,  tongue down, lips flanged, rhythmical sucking.  Audible Swallowing: A few with stimulation  Type of Nipple: Everted at rest and after stimulation(short shafted)  Comfort (Breast/Nipple): Soft / non-tender  Hold (Positioning): Assistance needed to correctly position infant at breast and maintain latch.  LATCH Score: 8  Interventions Interventions: Breast feeding basics reviewed;Assisted with latch;Skin to skin;Breast massage;Hand express;Breast compression;Position options;Support pillows;Adjust position  Lactation Tools Discussed/Used WIC Program: No   Consult Status Consult Status: PRN Date: 04/08/18 Follow-up type: Call as needed    Irene Pap Bridget Mcbride 04/08/2018, 3:46 PM

## 2018-04-14 ENCOUNTER — Ambulatory Visit: Payer: Self-pay

## 2018-04-14 NOTE — Lactation Note (Signed)
This note was copied from a baby's chart. Lactation Consultation Note  Patient Name: Boy Latiera Patti IZTIW'P Date: 04/14/2018 Reason for consult: Follow-up assessment;Preterm <34wks;NICU baby;1st time breastfeeding Baby is  20 weeks old  Mom requested for [facility] visit this am . When LC arrived  Per mom baby last fed at 9:30 am and was  Not ready to feed . Mom had questions regarding pumping/ and whether the baby needed a  Nipple shield  For use to make the latch better. Mom had mentioned she had been seen by another [facility] and was told she  Didn't need a NS for latch/ and was told by a nurse she did.  LC reassured mom a feeding assessment would help to identify what she needs for latching and to call  Nurses station or ask the RN to call LC.  Mom had mentioned she had only pumped x 6 yesterday due to the move. LC recommended if she has a Day when she can only pump x 6 to add a power pump x 60 mins. Mom mentioned she was aware of the  Power pumping.  Mom will call.   Maternal Data    Feeding Feeding Type: Breast Milk with Formula added  LATCH Score                   Interventions Interventions: Breast feeding basics reviewed  Lactation Tools Discussed/Used     Consult Status Consult Status: PRN Follow-up type: In-patient    Matilde Sprang Arieal Cuoco 04/14/2018, 10:57 AM

## 2018-04-14 NOTE — Lactation Note (Signed)
This note was copied from a baby's chart. Lactation Consultation Note  Patient Name: Bridget Mcbride JGGEZ'M Date: 04/14/2018 Reason for consult: Follow-up assessment;Preterm <34wks;NICU baby;1st time breastfeeding Mom requesting assistance with breastfeeding.  Mom requesting to try breastfeeding with a nipple shield because he does some biting she reports and makes her nipples sore with trying.  Assist on left breast in cross cradle hold.  Infant just sat there with no , hunger signs.  Put STS and then he started cuing.  Brought him down to breast and he Will open and mom attempt to bring him on but he just gets on the tip and holds it in mouth.  After a few attempts tried 20 mm nipple shield.  Infant latched and breastfed well.  Rythmic sucking and audible swallows heard.  Discussed nipple shield use.  How not to see the dome of the shield.  How to hand express before applying the shield and stretching it and making sure nipple in very center. Discussed seeing milk in shield when he came off.  Discussed pumping techniques.  Discussed pumping more when she's at baby's bedside. Discussed adding massage and hand expression also to get more milk. Urged mom to call lactation as needed. Maternal Data    Feeding Feeding Type: Breast Milk with Formula added  LATCH Score                   Interventions Interventions: Breast feeding basics reviewed  Lactation Tools Discussed/Used     Consult Status Consult Status: PRN Follow-up type: In-patient    Veleka Djordjevic Michaelle Copas 04/14/2018, 1:21 PM

## 2018-05-06 ENCOUNTER — Ambulatory Visit: Payer: BC Managed Care – PPO | Admitting: Hematology and Oncology

## 2018-05-06 ENCOUNTER — Inpatient Hospital Stay: Payer: BLUE CROSS/BLUE SHIELD | Attending: Hematology and Oncology | Admitting: Hematology and Oncology

## 2018-05-06 NOTE — Assessment & Plan Note (Signed)
History of blood clot May 2017 left popliteal vein Evaluated for different causes and felt to be related to prior oral contraception Lupus anticoagulant was negative  Prior treatment:  1. 1 year of Xarelto completed May 2018 2.  Pregnancy: Anticoagulation with Lovenox from November 2019 to February 2019, preterm delivery 03/19/2018  Recommendation: Patient may discontinue anticoagulation. She can be seen on an as-needed basis. She has been instructed not to resume oral contraception in the future.

## 2018-05-07 NOTE — Progress Notes (Signed)
This encounter was created in error - please disregard.

## 2018-09-09 ENCOUNTER — Telehealth: Payer: Self-pay | Admitting: *Deleted

## 2018-09-09 NOTE — Telephone Encounter (Signed)
Message received from patient wanting to know if Dr. Marin Olp would be OK with her taking Progestin along with her IUD.  Informed pt that I would speak with Dr. Marin Olp and call her back with answer when he returns to the office tomorrow.  Pt appreciative of assistance and has no further questions or concerns at this time.

## 2018-09-10 ENCOUNTER — Telehealth: Payer: Self-pay | Admitting: *Deleted

## 2018-09-10 NOTE — Telephone Encounter (Signed)
Call placed back to patient and patient notified per order of Dr. Marin Olp that it is ok to take Progestin along with her IUD.  Pt appreciative of call and has no further questions or concerns at this time.

## 2018-09-10 NOTE — Telephone Encounter (Signed)
Call from Dr. Vito Berger office requesting a letter stating it is ok for pt to take Progestin   After further review of pt chart, pt was last seen by Dr. Lindi Adie in Nov for management of blood thinner.  On 12/12/17 pt requested to transfer care to Dr. Lindi Adie office sd the facility was closer to home.  Pt has not been seen by Dr. Marin Olp since 10/18/ 2018 Attempt to reach Dr. Charlesetta Garibaldi office transferred to [facility] Called pt to discuss Letter requested. Pt verified understanding.

## 2018-09-11 ENCOUNTER — Telehealth: Payer: Self-pay

## 2018-09-11 NOTE — Telephone Encounter (Signed)
RN reviewed request by [facility] with Dr. Lindi Adie regarding approval for birth control with progesterone only.   Per Dr. Lindi Adie okay for birth control, with progesterone only.   Documentation faxed to [phone].

## 2018-09-12 ENCOUNTER — Telehealth: Payer: Self-pay | Admitting: *Deleted

## 2018-09-12 NOTE — Telephone Encounter (Signed)
Message received from patient requesting to re-establish care back with Dr. Marin Olp so that order for Progestin could be faxed to [place]. Pt informed that form was faxed with orders yesterday from Dr. Geralyn Flash office.  Pt appreciates information and states that she does not wish to make appt with Dr. Marin Olp at this time since order has been sent by Dr. Lindi Adie.

## 2018-09-19 LAB — OB RESULTS CONSOLE HEPATITIS B SURFACE ANTIGEN: Hepatitis B Surface Ag: NEGATIVE

## 2018-11-24 LAB — HM PAP SMEAR

## 2018-11-25 ENCOUNTER — Encounter: Payer: Self-pay | Admitting: General Practice

## 2019-02-26 ENCOUNTER — Telehealth: Payer: Self-pay | Admitting: Family Medicine

## 2019-02-26 NOTE — Telephone Encounter (Signed)
Please advise 

## 2019-02-26 NOTE — Telephone Encounter (Signed)
Pt is requesting a note for her employer allowing her to work from home indefinitely due to COVID. Pt states that she has no known additional risk factors but is concerned about exposing her prematurely born infant to the virus.  Pt [phone]

## 2019-03-04 NOTE — Telephone Encounter (Signed)
Patient calling back to check status of this letter request. Pt [phone]

## 2019-03-04 NOTE — Telephone Encounter (Signed)
Please let pt know that I can't write the note for indefinite work from home but I can write it until May and then we re-evaluate

## 2019-03-04 NOTE — Telephone Encounter (Signed)
Letter written for pt

## 2019-03-04 NOTE — Telephone Encounter (Signed)
Spoke with pt. She said that she had asked if it could be written for 6 months until June. If not not a problem. Pt would like it emailed to her at [email or web address]

## 2019-07-21 ENCOUNTER — Telehealth: Payer: Self-pay | Admitting: Family Medicine

## 2019-07-21 NOTE — Telephone Encounter (Signed)
Need to discuss continuing a work at home letter.

## 2019-07-21 NOTE — Telephone Encounter (Signed)
Left voice message telling patient that she will need an office visit to complete work letter

## 2019-07-21 NOTE — Telephone Encounter (Signed)
This would require an appointment

## 2019-07-23 ENCOUNTER — Other Ambulatory Visit: Payer: Self-pay

## 2019-07-23 ENCOUNTER — Encounter: Payer: Self-pay | Admitting: Family Medicine

## 2019-07-23 ENCOUNTER — Telehealth (INDEPENDENT_AMBULATORY_CARE_PROVIDER_SITE_OTHER): Payer: Medicaid Other | Admitting: Family Medicine

## 2019-07-23 DIAGNOSIS — Z566 Other physical and mental strain related to work: Secondary | ICD-10-CM

## 2019-07-23 NOTE — Progress Notes (Signed)
I have discussed the procedure for the virtual visit with the patient who has given consent to proceed with assessment and treatment.   Jerrad Mendibles L Kayle Passarelli, CMA     

## 2019-07-23 NOTE — Progress Notes (Signed)
   Virtual Visit via Video   I connected with patient on 07/23/19 at  2:30 PM EDT by a video enabled telemedicine application and verified that I am speaking with the correct person using two identifiers.  Location patient: Home Location provider: [place], Office Persons participating in the virtual visit: Patient, Provider, CMA (Jess B)  I discussed the limitations of evaluation and management by telemedicine and the availability of in person appointments. The patient expressed understanding and agreed to proceed.  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  Son is currently 16 months.  Works on [employer] team at [employer].  Pt doesn't feel that she's ready to return to work.  She is not vaccinated b/c she feels that she would relax her precautions and her son is not able to be vaccinated.  Even when son is eligible for vaccine she doesn't plan on vaccinating.  Pt reports that work has not complained about her being at home. Pt has 'no idea' when she is ready to go back to work.  ROS:   See pertinent positives and negatives per HPI.  Patient Active Problem List   Diagnosis Date Noted  . 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 Acute deep vein thrombosis (DVT) of distal vein of left lower extremity (HCC) 04/18/2016  . DVT of lower extremity (deep venous thrombosis) (HCC) 07/04/2015  . Posterior right knee pain 03/23/2015  . Inattention 09/26/2014  . Routine general medical examination at a health care facility 08/04/2013  . BV (bacterial vaginosis) 03/03/2012    Social History   Tobacco Use  . Smoking status: Never Smoker  . Smokeless tobacco: Never Used  Substance Use Topics  . Alcohol use: Not Currently    Alcohol/week: 0.0 standard drinks    Comment: occasional    Current Outpatient Medications:  .  Drospirenone (SLYND) 4 MG TABS, Slynd 4 mg (28) tablet   Take 1 tablet every day by oral route., Disp: , Rfl:  .  valACYclovir (VALTREX) 500 MG tablet, Take 500 mg by mouth as needed., Disp: , Rfl:   No Known Allergies  Objective:   Breastfeeding No   AAOx3, NAD NCAT, EOMI No obvious CN deficits Coloring WNL Pt is able to speak clearly, coherently without shortness of breath or increased work of breathing.  Thought process is linear.  Mood is appropriate.   Assessment and Plan:   Work stress- pt feels she is still not ready to return to work given the ongoing COVID situation.  She is aware that numbers are declining and vaccination rates are up, but since her son is not able to be vaccinated, she feels things still aren't safe.  She reports she is not vaccinated b/c she fears she will relax her cautious ways if she is and this will put him at greater risk.  I encouraged her to get vaccinated to protect both her and her son.  She reports employer is ok w/ her continuing to work from home.  Letter provided for HR.   Neena Rhymes, MD 07/23/2019

## 2019-08-21 ENCOUNTER — Ambulatory Visit (INDEPENDENT_AMBULATORY_CARE_PROVIDER_SITE_OTHER): Payer: Medicaid Other | Admitting: Family Medicine

## 2019-08-21 ENCOUNTER — Other Ambulatory Visit: Payer: Self-pay

## 2019-08-21 ENCOUNTER — Encounter: Payer: Self-pay | Admitting: Family Medicine

## 2019-08-21 VITALS — BP 115/72 | HR 69 | Temp 98.4°F | Resp 16 | Ht 64.0 in | Wt 135.1 lb

## 2019-08-21 DIAGNOSIS — Z0000 Encounter for general adult medical examination without abnormal findings: Secondary | ICD-10-CM | POA: Diagnosis not present

## 2019-08-21 DIAGNOSIS — Z13220 Encounter for screening for lipoid disorders: Secondary | ICD-10-CM | POA: Diagnosis not present

## 2019-08-21 DIAGNOSIS — D6862 Lupus anticoagulant syndrome: Secondary | ICD-10-CM | POA: Diagnosis not present

## 2019-08-21 NOTE — Assessment & Plan Note (Signed)
Has seen Dr Myna Hidalgo previously.  Check CBC.

## 2019-08-21 NOTE — Patient Instructions (Signed)
Follow up in 1 year or as needed We'll notify you of your lab results and make any changes if needed Keep up the good work on healthy diet and regular exercise- you look great! Call with any questions or concerns Happy 4th!!

## 2019-08-21 NOTE — Progress Notes (Signed)
   Subjective:    Patient ID: Bridget Mcbride, female    DOB: February 09, 1991, 29 y.o.   MRN: [id number]  HPI CPE- UTD on pap, Immunizations.  No concerns today.   Review of Systems Patient reports no vision/ hearing changes, adenopathy,fever, weight change,  persistant/recurrent hoarseness , swallowing issues, chest pain, palpitations, edema, persistant/recurrent cough, hemoptysis, dyspnea (rest/exertional/paroxysmal nocturnal), gastrointestinal bleeding (melena, rectal bleeding), abdominal pain, significant heartburn, bowel changes, GU symptoms (dysuria, hematuria, incontinence), Gyn symptoms (abnormal  bleeding, pain),  syncope, focal weakness, memory loss, numbness & tingling, skin/hair/nail changes, abnormal bruising or bleeding, anxiety, or depression.   This visit occurred during the SARS-CoV-2 public health emergency.  Safety protocols were in place, including screening questions prior to the visit, additional usage of staff PPE, and extensive cleaning of exam room while observing appropriate contact time as indicated for disinfecting solutions.       Objective:   Physical Exam General Appearance:    Alert, cooperative, no distress, appears stated age  Head:    Normocephalic, without obvious abnormality, atraumatic  Eyes:    PERRL, conjunctiva/corneas clear, EOM's intact, fundi    benign, both eyes  Ears:    Normal TM's and external ear canals, both ears  Nose:   Nares normal, septum midline, mucosa normal, no drainage    or sinus tenderness  Throat:   Lips, mucosa, and tongue normal; teeth and gums normal  Neck:   Supple, symmetrical, trachea midline, no adenopathy;    Thyroid: no enlargement/tenderness/nodules  Back:     Symmetric, no curvature, ROM normal, no CVA tenderness  Lungs:     Clear to auscultation bilaterally, respirations unlabored  Chest Wall:    No tenderness or deformity   Heart:    Regular rate and rhythm, S1 and S2 normal, no murmur, rub   or gallop  Breast  Exam:    Deferred to GYN  Abdomen:     Soft, non-tender, bowel sounds active all four quadrants,    no masses, no organomegaly  Genitalia:    Deferred to GYN  Rectal:    Extremities:   Extremities normal, atraumatic, no cyanosis or edema  Pulses:   2+ and symmetric all extremities  Skin:   Skin color, texture, turgor normal, no rashes or lesions  Lymph nodes:   Cervical, supraclavicular, and axillary nodes normal  Neurologic:   CNII-XII intact, normal strength, sensation and reflexes    throughout          Assessment & Plan:

## 2019-08-21 NOTE — Assessment & Plan Note (Signed)
Pt's PE WNL.  UTD on GYN, UTD on Tdap.  Declines COVID vaccine.  Check labs.  Anticipatory guidance provided.

## 2019-08-22 LAB — CBC WITH DIFFERENTIAL/PLATELET
Absolute Monocytes: 252 cells/uL (ref 200–950)
Basophils Absolute: 41 cells/uL (ref 0–200)
Basophils Relative: 0.9 %
Eosinophils Absolute: 392 cells/uL (ref 15–500)
Eosinophils Relative: 8.7 %
HCT: 42 % (ref 35.0–45.0)
Hemoglobin: 13.8 g/dL (ref 11.7–15.5)
Lymphs Abs: 2088 cells/uL (ref 850–3900)
MCH: 29.3 pg (ref 27.0–33.0)
MCHC: 32.9 g/dL (ref 32.0–36.0)
MCV: 89.2 fL (ref 80.0–100.0)
MPV: 10.7 fL (ref 7.5–12.5)
Monocytes Relative: 5.6 %
Neutro Abs: 1728 cells/uL (ref 1500–7800)
Neutrophils Relative %: 38.4 %
Platelets: 212 10*3/uL (ref 140–400)
RBC: 4.71 10*6/uL (ref 3.80–5.10)
RDW: 12.2 % (ref 11.0–15.0)
Total Lymphocyte: 46.4 %
WBC: 4.5 10*3/uL (ref 3.8–10.8)

## 2019-08-22 LAB — HEPATIC FUNCTION PANEL
AG Ratio: 1.6 (calc) (ref 1.0–2.5)
ALT: 19 U/L (ref 6–29)
AST: 17 U/L (ref 10–30)
Albumin: 4.5 g/dL (ref 3.6–5.1)
Alkaline phosphatase (APISO): 47 U/L (ref 31–125)
Bilirubin, Direct: 0.1 mg/dL (ref 0.0–0.2)
Globulin: 2.9 g/dL (calc) (ref 1.9–3.7)
Indirect Bilirubin: 0.5 mg/dL (calc) (ref 0.2–1.2)
Total Bilirubin: 0.6 mg/dL (ref 0.2–1.2)
Total Protein: 7.4 g/dL (ref 6.1–8.1)

## 2019-08-22 LAB — LIPID PANEL
Cholesterol: 215 mg/dL — ABNORMAL HIGH (ref <200)
HDL: 53 mg/dL (ref >=50)
LDL Cholesterol (Calc): 146 mg/dL (calc) — ABNORMAL HIGH
Non-HDL Cholesterol (Calc): 162 mg/dL (calc) — ABNORMAL HIGH (ref <130)
Total CHOL/HDL Ratio: 4.1 (calc) (ref <5.0)
Triglycerides: 61 mg/dL (ref <150)

## 2019-08-22 LAB — BASIC METABOLIC PANEL
BUN: 16 mg/dL (ref 7–25)
CO2: 26 mmol/L (ref 20–32)
Calcium: 9.4 mg/dL (ref 8.6–10.2)
Chloride: 104 mmol/L (ref 98–110)
Creat: 1.01 mg/dL (ref 0.50–1.10)
Glucose, Bld: 76 mg/dL (ref 65–99)
Potassium: 4.1 mmol/L (ref 3.5–5.3)
Sodium: 138 mmol/L (ref 135–146)

## 2019-08-22 LAB — TSH: TSH: 2.1 mIU/L

## 2019-08-25 ENCOUNTER — Telehealth: Payer: Self-pay | Admitting: Family Medicine

## 2019-08-25 NOTE — Telephone Encounter (Signed)
Pt is aware of her lab results and Cody's recommendation

## 2019-09-29 ENCOUNTER — Telehealth: Payer: Self-pay | Admitting: Hematology & Oncology

## 2019-09-29 NOTE — Telephone Encounter (Signed)
I called and spoke with patient regarding appointment date/time/location.  She requested 9/2 appt instead of 8/31.

## 2019-10-20 ENCOUNTER — Ambulatory Visit: Payer: Medicaid Other | Admitting: Hematology & Oncology

## 2019-10-20 ENCOUNTER — Other Ambulatory Visit: Payer: Medicaid Other

## 2019-10-20 LAB — OB RESULTS CONSOLE RUBELLA ANTIBODY, IGM: Rubella: IMMUNE

## 2019-10-20 LAB — HEPATITIS C ANTIBODY: HCV Ab: NEGATIVE

## 2019-10-21 ENCOUNTER — Other Ambulatory Visit: Payer: Self-pay

## 2019-10-21 DIAGNOSIS — I82402 Acute embolism and thrombosis of unspecified deep veins of left lower extremity: Secondary | ICD-10-CM

## 2019-10-22 ENCOUNTER — Inpatient Hospital Stay (HOSPITAL_BASED_OUTPATIENT_CLINIC_OR_DEPARTMENT_OTHER): Payer: Medicaid Other | Admitting: Hematology & Oncology

## 2019-10-22 ENCOUNTER — Inpatient Hospital Stay: Payer: Medicaid Other | Attending: Hematology & Oncology

## 2019-10-22 ENCOUNTER — Encounter: Payer: Self-pay | Admitting: *Deleted

## 2019-10-22 ENCOUNTER — Encounter: Payer: Self-pay | Admitting: Hematology & Oncology

## 2019-10-22 ENCOUNTER — Other Ambulatory Visit: Payer: Self-pay

## 2019-10-22 VITALS — BP 105/58 | HR 74 | Temp 98.9°F | Resp 16 | Wt 139.0 lb

## 2019-10-22 DIAGNOSIS — Z7901 Long term (current) use of anticoagulants: Secondary | ICD-10-CM | POA: Diagnosis present

## 2019-10-22 DIAGNOSIS — Z3A10 10 weeks gestation of pregnancy: Secondary | ICD-10-CM

## 2019-10-22 DIAGNOSIS — Z86718 Personal history of other venous thrombosis and embolism: Secondary | ICD-10-CM | POA: Insufficient documentation

## 2019-10-22 DIAGNOSIS — O99111 Other diseases of the blood and blood-forming organs and certain disorders involving the immune mechanism complicating pregnancy, first trimester: Secondary | ICD-10-CM | POA: Insufficient documentation

## 2019-10-22 DIAGNOSIS — I82402 Acute embolism and thrombosis of unspecified deep veins of left lower extremity: Secondary | ICD-10-CM

## 2019-10-22 LAB — CBC WITH DIFFERENTIAL (CANCER CENTER ONLY)
Abs Immature Granulocytes: 0.02 10*3/uL (ref 0.00–0.07)
Basophils Absolute: 0 10*3/uL (ref 0.0–0.1)
Basophils Relative: 1 %
Eosinophils Absolute: 0.4 10*3/uL (ref 0.0–0.5)
Eosinophils Relative: 7 %
HCT: 39.8 % (ref 36.0–46.0)
Hemoglobin: 13.5 g/dL (ref 12.0–15.0)
Immature Granulocytes: 0 %
Lymphocytes Relative: 28 %
Lymphs Abs: 1.6 10*3/uL (ref 0.7–4.0)
MCH: 30 pg (ref 26.0–34.0)
MCHC: 33.9 g/dL (ref 30.0–36.0)
MCV: 88.4 fL (ref 80.0–100.0)
Monocytes Absolute: 0.5 10*3/uL (ref 0.1–1.0)
Monocytes Relative: 8 %
Neutro Abs: 3.3 10*3/uL (ref 1.7–7.7)
Neutrophils Relative %: 56 %
Platelet Count: 213 10*3/uL (ref 150–400)
RBC: 4.5 MIL/uL (ref 3.87–5.11)
RDW: 12.1 % (ref 11.5–15.5)
WBC Count: 5.7 10*3/uL (ref 4.0–10.5)
nRBC: 0 % (ref 0.0–0.2)

## 2019-10-22 LAB — CMP (CANCER CENTER ONLY)
ALT: 9 U/L (ref 0–44)
AST: 11 U/L — ABNORMAL LOW (ref 15–41)
Albumin: 4 g/dL (ref 3.5–5.0)
Alkaline Phosphatase: 37 U/L — ABNORMAL LOW (ref 38–126)
Anion gap: 6 (ref 5–15)
BUN: 11 mg/dL (ref 6–20)
CO2: 27 mmol/L (ref 22–32)
Calcium: 9.7 mg/dL (ref 8.9–10.3)
Chloride: 101 mmol/L (ref 98–111)
Creatinine: 0.83 mg/dL (ref 0.44–1.00)
GFR, Est AFR Am: >60 mL/min (ref >60)
GFR, Estimated: >60 mL/min (ref >60)
Glucose, Bld: 73 mg/dL (ref 70–99)
Potassium: 3.9 mmol/L (ref 3.5–5.1)
Sodium: 134 mmol/L — ABNORMAL LOW (ref 135–145)
Total Bilirubin: 0.5 mg/dL (ref 0.3–1.2)
Total Protein: 7.1 g/dL (ref 6.5–8.1)

## 2019-10-22 NOTE — Progress Notes (Signed)
Hematology and Oncology Follow Up Visit  Bridget Mcbride [id number] 1990/04/19 29 y.o. 10/22/2019   Principle Diagnosis:   First trimester pregnancy  History of DVT in the left leg  Current Therapy:    Lovenox 80 mg subcu daily     Interim History:  Bridget Mcbride is back for long awaited follow-up.  Is been 3 years since we last saw her.  She is now pregnant with her second child.  Her first pregnancy was a couple years ago.  She was on Lovenox for this.  Her child was born at 31 weeks.  He weighed 3 pounds.  He was in the NICU for about 6 weeks.  Thankfully, he is doing well now.  She is now pregnant again.  She is [redacted] weeks pregnant.  She was quite surprised that she get pregnant so quickly.  She is back on Lovenox.  She is doing well with the Lovenox.  I think she takes it at nighttime.  She has had no problems with bleeding.  There has been no cough or chest wall pain.  She has had no leg swelling.  She has had no rashes.  She has been very diligent with avoiding the coronavirus.  She really cannot get the coronavirus vaccine while she is pregnant in my opinion.  Overall, she has been try to stay active.  She was working out quite a bit.  She is still working.  Overall, her performance status is ECOG 0.  Medications:  Current Outpatient Medications:  .  enoxaparin (LOVENOX) 80 MG/0.8ML injection, Inject 80 mg into the skin daily., Disp: , Rfl:  .  Prenat-Fe Carbonyl-FA-Omega 3 (ONE-A-DAY WOMENS PRENATAL 1) 28-0.8-235 MG CAPS, Take 1 tablet by mouth daily., Disp: , Rfl:  .  acyclovir cream (ZOVIRAX) 5 %, , Disp: , Rfl:  .  valACYclovir (VALTREX) 500 MG tablet, Take 500 mg by mouth as needed. (Patient not taking: Reported on 08/21/2019), Disp: , Rfl:   Allergies: No Known Allergies  Past Medical History, Surgical history, Social history, and Family History were reviewed and updated.  Review of Systems: Review of Systems  Constitutional: Negative.   HENT:   Negative.   Eyes: Negative.   Respiratory: Negative.   Cardiovascular: Negative.   Gastrointestinal: Negative.   Endocrine: Negative.   Genitourinary: Negative.    Musculoskeletal: Negative.   Skin: Negative.   Neurological: Negative.   Hematological: Negative.   Psychiatric/Behavioral: Negative.     Physical Exam:  weight is 139 lb (63 kg). Her oral temperature is 98.9 F (37.2 C). Her blood pressure is 105/58 (abnormal) and her pulse is 74. Her respiration is 16 and oxygen saturation is 100%.   Wt Readings from Last 3 Encounters:  10/22/19 139 lb (63 kg)  08/21/19 135 lb 2 oz (61.3 kg)  02/20/18 149 lb 7.6 oz (67.8 kg)    Physical Exam Vitals reviewed.  HENT:     Head: Normocephalic and atraumatic.  Eyes:     Pupils: Pupils are equal, round, and reactive to light.  Cardiovascular:     Rate and Rhythm: Normal rate and regular rhythm.     Heart sounds: Normal heart sounds.  Pulmonary:     Effort: Pulmonary effort is normal.     Breath sounds: Normal breath sounds.  Abdominal:     General: Bowel sounds are normal.     Palpations: Abdomen is soft.     Comments: Abdominal exam may show some slight bulging from her pregnancy.  There  is no fluid wave.  There is no obvious hepatosplenomegaly.  Musculoskeletal:        General: No tenderness or deformity. Normal range of motion.     Cervical back: Normal range of motion.     Comments: Extremities shows no clubbing, cyanosis or edema.  She has a negative Homans' sign in her legs.  I cannot palpate any venous cord in her lower legs.  Lymphadenopathy:     Cervical: No cervical adenopathy.  Skin:    General: Skin is warm and dry.     Findings: No erythema or rash.  Neurological:     Mental Status: She is alert and oriented to person, place, and time.  Psychiatric:        Behavior: Behavior normal.        Thought Content: Thought content normal.        Judgment: Judgment normal.      Lab Results  Component Value Date    WBC 5.7 10/22/2019   HGB 13.5 10/22/2019   HCT 39.8 10/22/2019   MCV 88.4 10/22/2019   PLT 213 10/22/2019     Chemistry      Component Value Date/Time   NA 134 (L) 10/22/2019 1041   NA 138 12/03/2016 1503   NA 141 04/18/2016 0823   K 3.9 10/22/2019 1041   K 4.1 12/03/2016 1503   K 3.9 04/18/2016 0823   CL 101 10/22/2019 1041   CL 103 12/03/2016 1503   CO2 27 10/22/2019 1041   CO2 29 12/03/2016 1503   CO2 27 04/18/2016 0823   BUN 11 10/22/2019 1041   BUN 13 12/03/2016 1503   BUN 21.1 04/18/2016 0823   CREATININE 0.83 10/22/2019 1041   CREATININE 1.01 08/21/2019 1412   CREATININE 1.1 04/18/2016 0823      Component Value Date/Time   CALCIUM 9.7 10/22/2019 1041   CALCIUM 9.4 12/03/2016 1503   CALCIUM 9.4 04/18/2016 0823   ALKPHOS 37 (L) 10/22/2019 1041   ALKPHOS 80 12/03/2016 1503   ALKPHOS 74 04/18/2016 0823   AST 11 (L) 10/22/2019 1041   AST 17 04/18/2016 0823   ALT 9 10/22/2019 1041   ALT 13 04/18/2016 0823   BILITOT 0.5 10/22/2019 1041   BILITOT 0.31 04/18/2016 0823       Impression and Plan: Bridget Mcbride is a very charming 29 year old African-American female.  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  She now is pregnant with her second child.  She is on anticoagulation with Lovenox.  She will need Lovenox through her pregnancy and then 6 weeks after she delivers.  Her labs look good.  I do not see any problems with the Lovenox.  I would like to see her back in November prior to Thanksgiving.  I want to make sure everything is doing okay.  She knows that she can give [place] a call if she has any problems.   Bridget Macho, MD 9/2/20212:31 PM

## 2019-11-06 ENCOUNTER — Other Ambulatory Visit: Payer: Self-pay | Admitting: Obstetrics and Gynecology

## 2019-11-09 ENCOUNTER — Telehealth: Payer: Self-pay

## 2019-11-09 NOTE — Telephone Encounter (Signed)
Patient called inquiring if she could ger her letter about the covid exemption sent to her on my-chart. Called patient back and informed her it was sent via mychart as requested. She verified that she did receive it and denies any other questions or concerns at this time.

## 2019-11-20 ENCOUNTER — Telehealth (HOSPITAL_COMMUNITY): Payer: Self-pay | Admitting: *Deleted

## 2019-11-20 NOTE — Telephone Encounter (Signed)
Preadmission screen  

## 2019-11-23 ENCOUNTER — Telehealth (HOSPITAL_COMMUNITY): Payer: Self-pay | Admitting: *Deleted

## 2019-11-23 ENCOUNTER — Encounter (HOSPITAL_COMMUNITY): Payer: Self-pay | Admitting: *Deleted

## 2019-11-23 ENCOUNTER — Other Ambulatory Visit (HOSPITAL_COMMUNITY)
Admission: RE | Admit: 2019-11-23 | Discharge: 2019-11-23 | Disposition: A | Discharge status: Home / Self Care | Payer: Medicaid Other | Source: Ambulatory Visit | Attending: Obstetrics and Gynecology | Admitting: Obstetrics and Gynecology

## 2019-11-23 DIAGNOSIS — Z01812 Encounter for preprocedural laboratory examination: Secondary | ICD-10-CM | POA: Insufficient documentation

## 2019-11-23 DIAGNOSIS — Z20822 Contact with and (suspected) exposure to covid-19: Secondary | ICD-10-CM | POA: Diagnosis not present

## 2019-11-23 LAB — SARS CORONAVIRUS 2 (TAT 6-24 HRS): SARS Coronavirus 2: NEGATIVE

## 2019-11-23 NOTE — Telephone Encounter (Signed)
Preadmission screen Obtained medications from prenatal record.  Discussed lovenox with Anesthesia and stated she was not to take her lovenox the night before her cerclage.  Called pt and left message updating these instructions.

## 2019-11-23 NOTE — Telephone Encounter (Signed)
Preadmission screen Pt refuses to discuss her current medications.  Explained that without current medications reviewed I could not provide accurate preop instructions.  Pt states she gave this information to other people earlier and does not feel like discussing it again.  Explained I cannot view that list currently.  Pt still refused to discuss her medications.

## 2019-11-25 ENCOUNTER — Ambulatory Visit (HOSPITAL_COMMUNITY)
Admission: RE | Admit: 2019-11-25 | Discharge: 2019-11-25 | Disposition: A | Discharge status: Home / Self Care | Payer: Medicaid Other | Attending: Obstetrics and Gynecology | Admitting: Obstetrics and Gynecology

## 2019-11-25 ENCOUNTER — Inpatient Hospital Stay (HOSPITAL_COMMUNITY): Payer: Medicaid Other | Admitting: Anesthesiology

## 2019-11-25 ENCOUNTER — Encounter (HOSPITAL_COMMUNITY): Payer: Self-pay | Admitting: Obstetrics and Gynecology

## 2019-11-25 ENCOUNTER — Encounter (HOSPITAL_COMMUNITY)
Admission: RE | Disposition: A | Discharge status: Home / Self Care | Payer: Self-pay | Source: Home / Self Care | Attending: Obstetrics and Gynecology

## 2019-11-25 ENCOUNTER — Other Ambulatory Visit: Payer: Self-pay

## 2019-11-25 DIAGNOSIS — Z8619 Personal history of other infectious and parasitic diseases: Secondary | ICD-10-CM | POA: Insufficient documentation

## 2019-11-25 DIAGNOSIS — Z86718 Personal history of other venous thrombosis and embolism: Secondary | ICD-10-CM | POA: Insufficient documentation

## 2019-11-25 DIAGNOSIS — O99611 Diseases of the digestive system complicating pregnancy, first trimester: Secondary | ICD-10-CM | POA: Diagnosis not present

## 2019-11-25 DIAGNOSIS — K589 Irritable bowel syndrome without diarrhea: Secondary | ICD-10-CM | POA: Insufficient documentation

## 2019-11-25 DIAGNOSIS — K219 Gastro-esophageal reflux disease without esophagitis: Secondary | ICD-10-CM | POA: Diagnosis not present

## 2019-11-25 DIAGNOSIS — O3431 Maternal care for cervical incompetence, first trimester: Secondary | ICD-10-CM | POA: Insufficient documentation

## 2019-11-25 DIAGNOSIS — Z7901 Long term (current) use of anticoagulants: Secondary | ICD-10-CM | POA: Insufficient documentation

## 2019-11-25 DIAGNOSIS — Z3A01 Less than 8 weeks gestation of pregnancy: Secondary | ICD-10-CM | POA: Diagnosis not present

## 2019-11-25 DIAGNOSIS — Z79899 Other long term (current) drug therapy: Secondary | ICD-10-CM | POA: Diagnosis not present

## 2019-11-25 DIAGNOSIS — N883 Incompetence of cervix uteri: Secondary | ICD-10-CM

## 2019-11-25 HISTORY — PX: CERVICAL CERCLAGE: SHX1329

## 2019-11-25 LAB — CBC
HCT: 39.1 % (ref 36.0–46.0)
Hemoglobin: 13.3 g/dL (ref 12.0–15.0)
MCH: 29.2 pg (ref 26.0–34.0)
MCHC: 34 g/dL (ref 30.0–36.0)
MCV: 85.9 fL (ref 80.0–100.0)
Platelets: 223 10*3/uL (ref 150–400)
RBC: 4.55 MIL/uL (ref 3.87–5.11)
RDW: 11.9 % (ref 11.5–15.5)
WBC: 5.5 10*3/uL (ref 4.0–10.5)
nRBC: 0 % (ref 0.0–0.2)

## 2019-11-25 SURGERY — CERCLAGE, CERVIX, VAGINAL APPROACH
Anesthesia: Spinal | Site: Vagina | Wound class: Clean Contaminated

## 2019-11-25 MED ORDER — FENTANYL CITRATE (PF) 100 MCG/2ML IJ SOLN
25.0000 ug | INTRAMUSCULAR | Status: DC | PRN
  Administered 2019-11-25: 25 ug via INTRAVENOUS

## 2019-11-25 MED ORDER — SODIUM CHLORIDE (PF) 0.9 % IJ SOLN
Freq: Once | INTRAMUSCULAR | Status: AC
  Filled 2019-11-25: qty 1

## 2019-11-25 MED ORDER — OXYCODONE HCL 5 MG/5ML PO SOLN: 5.0000 mg | Freq: Once | ORAL | Status: DC | PRN

## 2019-11-25 MED ORDER — ACETAMINOPHEN 500 MG PO TABS
ORAL_TABLET | ORAL | Status: AC
  Filled 2019-11-25: qty 2

## 2019-11-25 MED ORDER — LACTATED RINGERS IV SOLN: INTRAVENOUS | Status: DC | PRN

## 2019-11-25 MED ORDER — PROMETHAZINE HCL 25 MG/ML IJ SOLN: 6.2500 mg | INTRAMUSCULAR | Status: DC | PRN

## 2019-11-25 MED ORDER — FENTANYL CITRATE (PF) 100 MCG/2ML IJ SOLN
INTRAMUSCULAR | Status: AC
  Filled 2019-11-25: qty 2

## 2019-11-25 MED ORDER — SODIUM CHLORIDE 0.9 % IR SOLN
Status: DC | PRN
  Administered 2019-11-25: 1000 mL

## 2019-11-25 MED ORDER — OXYCODONE HCL 5 MG PO TABS: 5.0000 mg | ORAL_TABLET | Freq: Once | ORAL | Status: DC | PRN

## 2019-11-25 MED ORDER — BUPIVACAINE IN DEXTROSE 0.75-8.25 % IT SOLN
INTRATHECAL | Status: DC | PRN
  Administered 2019-11-25: 1.2 mL via INTRATHECAL

## 2019-11-25 MED ORDER — ACETAMINOPHEN 500 MG PO TABS
1000.0000 mg | ORAL_TABLET | Freq: Once | ORAL | Status: AC
  Administered 2019-11-25: 1000 mg via ORAL

## 2019-11-25 MED ORDER — POVIDONE-IODINE 10 % EX SWAB: 2.0000 "application " | Freq: Once | CUTANEOUS | Status: DC

## 2019-11-25 SURGICAL SUPPLY — 19 items
CANISTER SUCT 3000ML PPV (MISCELLANEOUS) ×3 IMPLANT
GLOVE BIO SURGEON STRL SZ7.5 (GLOVE) ×3 IMPLANT
GLOVE BIOGEL PI IND STRL 7.0 (GLOVE) ×1 IMPLANT
GLOVE BIOGEL PI IND STRL 7.5 (GLOVE) ×1 IMPLANT
GLOVE BIOGEL PI INDICATOR 7.0 (GLOVE) ×2
GLOVE BIOGEL PI INDICATOR 7.5 (GLOVE) ×2
GOWN STRL REUS W/TWL LRG LVL3 (GOWN DISPOSABLE) ×6 IMPLANT
NS IRRIG 1000ML POUR BTL (IV SOLUTION) ×3 IMPLANT
PACK VAGINAL MINOR WOMEN LF (CUSTOM PROCEDURE TRAY) ×3 IMPLANT
PAD OB MATERNITY 4.3X12.25 (PERSONAL CARE ITEMS) ×3 IMPLANT
PAD PREP 24X48 CUFFED NSTRL (MISCELLANEOUS) ×3 IMPLANT
SUT MERSILENE 5MM BP 1 12 (SUTURE) ×3 IMPLANT
SUT PROLENE 1 CT 1 30 (SUTURE) ×3 IMPLANT
SYR BULB IRRIGATION 50ML (SYRINGE) ×3 IMPLANT
TOWEL OR 17X24 6PK STRL BLUE (TOWEL DISPOSABLE) ×3 IMPLANT
TRAY FOLEY W/BAG SLVR 14FR (SET/KITS/TRAYS/PACK) ×3 IMPLANT
TUBING NON-CON 1/4 X 20 CONN (TUBING) IMPLANT
TUBING NON-CON 1/4 X 20' CONN (TUBING)
YANKAUER SUCT BULB TIP NO VENT (SUCTIONS) IMPLANT

## 2019-11-25 NOTE — Anesthesia Postprocedure Evaluation (Signed)
Anesthesia Post Note  Patient: CYNTHA BRICKMAN  Procedure(s) Performed: CERCLAGE CERVICAL (N/A Vagina )     Patient location during evaluation: PACU Anesthesia Type: Spinal Level of consciousness: oriented and awake and alert Pain management: pain level controlled Vital Signs Assessment: post-procedure vital signs reviewed and stable Respiratory status: spontaneous breathing, respiratory function stable and patient connected to nasal cannula oxygen Cardiovascular status: blood pressure returned to baseline and stable Postop Assessment: no headache, no backache, no apparent nausea or vomiting and spinal receding Anesthetic complications: no   No complications documented.  Last Vitals:  Vitals:   11/25/19 1503 11/25/19 1608  BP: 116/60 108/61  Pulse:    Resp:  18  Temp:  37 C  SpO2:      Last Pain:  Vitals:   11/25/19 1608  TempSrc: Oral  PainSc: 0-No pain   Pain Goal:    LLE Motor Response: Purposeful movement (11/25/19 1608) LLE Sensation: Tingling (11/25/19 1608) RLE Motor Response: Purposeful movement (11/25/19 1608) RLE Sensation: Tingling (11/25/19 1608)     Epidural/Spinal Function Cutaneous sensation: Tingles (11/25/19 1608), Patient able to flex knees: Yes (11/25/19 1608), Patient able to lift hips off bed: Yes (11/25/19 1608), Back pain beyond tenderness at insertion site: No (11/25/19 1608), Progressively worsening motor and/or sensory loss: No (11/25/19 1608), Bowel and/or bladder incontinence post epidural: No (11/25/19 1608)  Tayjah Lobdell P Jennipher Weatherholtz

## 2019-11-25 NOTE — Progress Notes (Signed)
Initial visit to introduce spiritual care services and provide education about advance directives.  Bridget Mcbride shared that she is a little nervous about her surgery today.  She is having a preventative cerclage placed because she had an incompetent cervix with her 30 year old son.  She is hopeful that this will help her to avoid pre term labor.  Chaplain offered listening presence as she discussed her fears.  Chaplain provided education on Living Will and Health Care Power of [place].  Pt states she does not wish to complete the documents at this time, but realizes it's a good thing to think about.  Chaplain left material with pt with instructions to share her wishes with her loved ones and not to sign the forms without the presence of a notary and witnesses.  She is aware of how to reach spiritual care for additional support.  Please page as further needs arise.  Bridget Mcbride. Bridget Mcbride, M.Div. [facility] Chaplain Pager [phone] Office [phone]     11/25/19 1200  Clinical Encounter Type  Visited With Patient  Visit Type Initial;Pre-op  Referral From Nurse  Advance Directives (For Healthcare)  Does Patient Have a Medical Advance Directive? No  Would patient like information on creating a medical advance directive? Yes (Inpatient - patient defers creating a medical advance directive at this time - Information given)

## 2019-11-25 NOTE — H&P (Addendum)
Bridget Mcbride is an 29 y.o. female. Pt with h/o incompetent cervix here for cerclage.    Menstrual History:  No LMP recorded. Patient is pregnant.    Past Medical History:  Diagnosis Date  . Anemia   . BV (bacterial vaginosis)   . Chlamydia   . DVT (deep venous thrombosis) (HCC)    Assoc w/OCP's  . GERD (gastroesophageal reflux disease)   . HSV infection   . IBS (irritable bowel syndrome)     Past Surgical History:  Procedure Laterality Date  . NO PAST SURGERIES      Family History  Problem Relation Age of Onset  . Migraines Father   . Diabetes Paternal Aunt   . Arthritis Paternal Grandmother   . Hypertension Mother   . Hypertension Maternal Aunt   . Hypertension Maternal Uncle   . Stroke Maternal Uncle   . Hypertension Maternal Aunt   . Hypertension Maternal Aunt   . Hypertension Maternal Aunt   . Stroke Cousin     Social History:  reports that she has never smoked. She has never used smokeless tobacco. She reports previous alcohol use. She reports that she does not use drugs.  Allergies: No Known Allergies  Medications Prior to Admission  Medication Sig Dispense Refill Last Dose  . Ascorbic Acid (VITAMIN C PO) Take 1 tablet by mouth daily.     . butalbital-acetaminophen-caffeine (FIORICET) 50-325-40 MG tablet Take 1 tablet by mouth every 6 (six) hours as needed for headache.    Past Month at Unknown time  . Cholecalciferol (VITAMIN D3 PO) Take 1 drop by mouth daily.     . diphenhydrAMINE (BENADRYL) 25 mg capsule Take 25 mg by mouth at bedtime as needed (allergies/sleep.).   Past Week at Unknown time  . enoxaparin (LOVENOX) 80 MG/0.8ML injection Inject 80 mg into the skin at bedtime.    11/23/2019 at 2000  . famotidine (PEPCID) 20 MG tablet Take 20 mg by mouth 2 (two) times daily.   11/24/2019 at Unknown time  . loratadine (CLARITIN) 10 MG tablet Take 10 mg by mouth daily. Claritin RediTabs   Past Week at Unknown time  . Prenat-Fe Carbonyl-FA-Omega 3  (ONE-A-DAY WOMENS PRENATAL 1) 28-0.8-235 MG CAPS Take 1 tablet by mouth daily.   11/24/2019 at Unknown time  . promethazine (PHENERGAN) 25 MG tablet Take 25 mg by mouth every 6 (six) hours as needed for nausea or vomiting.   Past Month at Unknown time  . valACYclovir (VALTREX) 500 MG tablet Take 500 mg by mouth 2 (two) times daily as needed (outbreaks).    11/24/2019 at Unknown time  . omeprazole (PRILOSEC) 20 MG capsule Take 20 mg by mouth at bedtime.   More than a month at Unknown time    Review of Systems Denies F/C/N/V/D  Blood pressure 127/84, pulse 85, temperature 99.2 F (37.3 C), temperature source Oral, resp. rate 18, height 5\' 4"  (1.626 m), weight 62.6 kg, SpO2 99 %, unknown if currently breastfeeding. Physical Exam  Lungs cta CV RRR Abd gravid, NT Ext no calf tenderness  Results for orders placed or performed during the hospital encounter of 11/25/19 (from the past 24 hour(s))  CBC     Status: None   Collection Time: 11/25/19 10:26 AM  Result Value Ref Range   WBC 5.5 4.0 - 10.5 K/uL   RBC 4.55 3.87 - 5.11 MIL/uL   Hemoglobin 13.3 12.0 - 15.0 g/dL   HCT 01/25/20 36 - 46 %   MCV 85.9  80.0 - 100.0 fL   MCH 29.2 26.0 - 34.0 pg   MCHC 34.0 30.0 - 36.0 g/dL   RDW 26.3 33.5 - 45.6 %   Platelets 223 150 - 400 K/uL   nRBC 0.0 0.0 - 0.2 %    No results found.  Assessment/Plan: P1 at 33 5/7wks here for cerclage.  R/b/a discussed with patient including but not limited to bleeding infection and injury.  Consent s/w.  H/o DVT on lovenox.  Last dose the night of 11/23/19.  No prodromal sxs of HSV.  Bridget Mcbride 11/25/2019, 12:14 PM

## 2019-11-25 NOTE — Anesthesia Procedure Notes (Signed)
Spinal  Patient location during procedure: OR Start time: 11/25/2019 12:30 PM End time: 11/25/2019 12:40 PM Staffing Performed: anesthesiologist  Anesthesiologist: Leonides Grills, MD Preanesthetic Checklist Completed: patient identified, IV checked, risks and benefits discussed, surgical consent, monitors and equipment checked, pre-op evaluation and timeout performed Spinal Block Patient position: sitting Prep: DuraPrep Patient monitoring: cardiac monitor, continuous pulse ox and blood pressure Approach: midline Location: L4-5 Injection technique: single-shot Needle Needle type: Pencan  Needle gauge: 24 G Needle length: 9 cm Assessment Sensory level: T10 Additional Notes Functioning IV was confirmed and monitors were applied. Sterile prep and drape, including hand hygiene and sterile gloves were used. The patient was positioned and the spine was prepped. The skin was anesthetized with lidocaine.  Free flow of clear CSF was obtained prior to injecting local anesthetic into the CSF.  The spinal needle aspirated freely following injection.  The needle was carefully withdrawn.  The patient tolerated the procedure well.

## 2019-11-25 NOTE — Op Note (Addendum)
Preop Diagnosis: 1.14 5/7wks 2.Incompetent cervix   Postop Diagnosis: 1.14 5/7 2.Incompetent cervix   Procedure: CERCLAGE CERVICAL   Anesthesia: Spinal   Anesthesiologist: Leonides Grills, MD   Attending: Osborn Coho, MD   Assistant: Scrub tech  Findings: Closed long thick  Pathology: N/a  Fluids: 1000 cc  UOP: 200 cc  EBL: 5 cc  Complications: None  Procedure:Then patient was taken to the operating room after the risks, benefits and alternatives discussed with the patient and consent signed and witnessed.  The patient was given a spinal per anesthesia and placed in the dorsal lithotomy position.  The patient was prepped and draped in the usual sterile fashion.  A cervical cerclage stitch was placed using Mersilene and the knot was tied anteriorly on the cervix and a stitch of 1 prolene at the base to help elevate knot if necessary when it comes time for removal.  Clindamycin douche was performed.  Membranes remained intact and post procedure fetal heart rate was 147.  Sponge, lap and needle count was correct and the patient was transferred to the recovery room in good condition.

## 2019-11-25 NOTE — Anesthesia Preprocedure Evaluation (Addendum)
Anesthesia Evaluation  Patient identified by MRN, date of birth, ID band Patient awake    Reviewed: Allergy & Precautions, NPO status , Patient's Chart, lab work & pertinent test results  Airway Mallampati: I  TM Distance: >3 FB Neck ROM: Full    Dental no notable dental hx.    Pulmonary neg pulmonary ROS,    Pulmonary exam normal breath sounds clear to auscultation       Cardiovascular + DVT  Normal cardiovascular exam Rhythm:Regular Rate:Normal     Neuro/Psych negative neurological ROS  negative psych ROS   GI/Hepatic Neg liver ROS, GERD  Medicated and Controlled,IBS (irritable bowel syndrome)   Endo/Other  negative endocrine ROS  Renal/GU negative Renal ROS     Musculoskeletal negative musculoskeletal ROS (+)   Abdominal   Peds  Hematology  (+) Blood dyscrasia, ,   Anesthesia Other Findings incompetent cervix  Reproductive/Obstetrics (+) Pregnancy                            Anesthesia Physical Anesthesia Plan  ASA: II  Anesthesia Plan: Spinal   Post-op Pain Management:    Induction: Intravenous  PONV Risk Score and Plan: 2 and Ondansetron, Dexamethasone and Treatment may vary due to age or medical condition  Airway Management Planned: Natural Airway  Additional Equipment:   Intra-op Plan:   Post-operative Plan:   Informed Consent: I have reviewed the patients History and Physical, chart, labs and discussed the procedure including the risks, benefits and alternatives for the proposed anesthesia with the patient or authorized representative who has indicated his/her understanding and acceptance.     Dental advisory given  Plan Discussed with: CRNA  Anesthesia Plan Comments:         Anesthesia Quick Evaluation

## 2019-11-25 NOTE — Discharge Instructions (Signed)
May take ibuprofen 400mg  every 6hrs as needed for cramping for the first 48hrs.

## 2019-11-25 NOTE — Transfer of Care (Signed)
Immediate Anesthesia Transfer of Care Note  Patient: Bridget Mcbride  Procedure(s) Performed: CERCLAGE CERVICAL (N/A Vagina )  Patient Location: PACU  Anesthesia Type:Spinal  Level of Consciousness: awake, alert  and oriented  Airway & Oxygen Therapy: Patient Spontanous Breathing  Post-op Assessment: Report given to RN and Post -op Vital signs reviewed and stable  Post vital signs: Reviewed and stable  Last Vitals:  Vitals Value Taken Time  BP 113/69 11/25/19 1315  Temp    Pulse 71 11/25/19 1315  Resp 13 11/25/19 1315  SpO2 100 % 11/25/19 1315  Vitals shown include unvalidated device data.  Last Pain:  Vitals:   11/25/19 1030  TempSrc: Oral         Complications: No complications documented.

## 2019-12-01 ENCOUNTER — Other Ambulatory Visit: Payer: Self-pay | Admitting: Family

## 2019-12-02 ENCOUNTER — Encounter: Payer: Self-pay | Admitting: *Deleted

## 2019-12-04 ENCOUNTER — Telehealth: Payer: Self-pay | Admitting: *Deleted

## 2019-12-04 NOTE — Telephone Encounter (Signed)
I returned patient's phone call regarding letter for work and paperwork for [employer]. I informed her that Dr. Myna Hidalgo can't write for her to continue to stay at home and work. That needs to come from your OB/GYN or PCP. She asked me to fax the paperwork to Bethena Roys at [phone]. She verbalized understanding of conversation.

## 2019-12-23 LAB — OB RESULTS CONSOLE GC/CHLAMYDIA
Chlamydia: NEGATIVE
Gonorrhea: NEGATIVE

## 2019-12-24 ENCOUNTER — Inpatient Hospital Stay: Payer: Medicaid Other

## 2019-12-24 ENCOUNTER — Inpatient Hospital Stay: Payer: Medicaid Other | Admitting: Hematology & Oncology

## 2019-12-30 IMAGING — US US MFM OB TRANSVAGINAL
1 series · 13 of 28 positions shown · non-contrast
Comparison: none

[Series 1: us mfm ob transvaginal · 96 acquisitions, 13 frames shown]
[im 4/96]
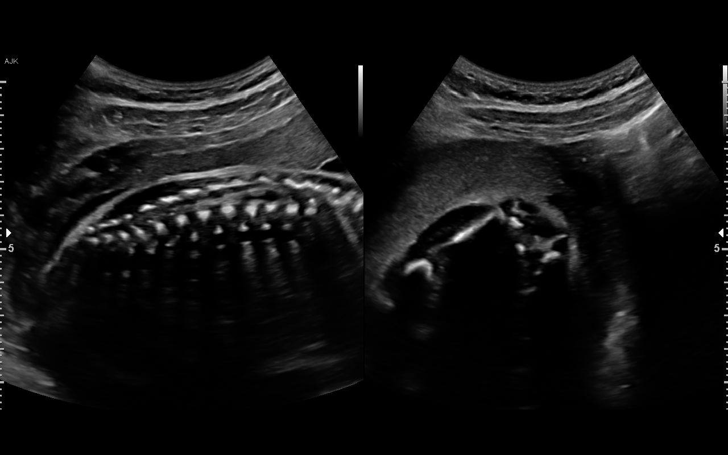
[im 11/96]
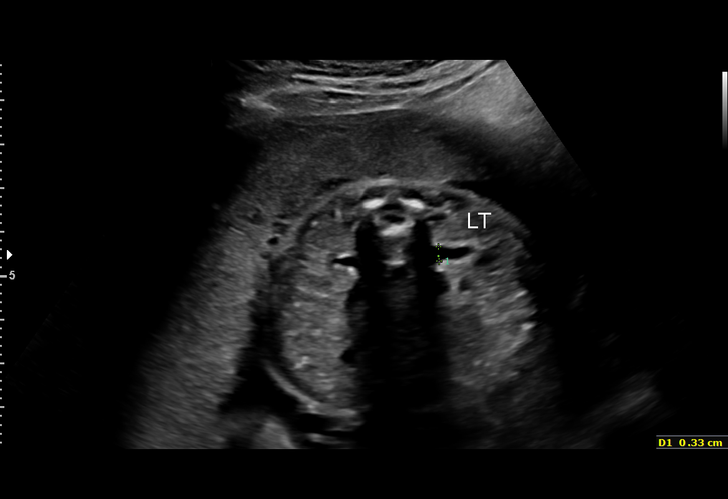
[im 18/96]
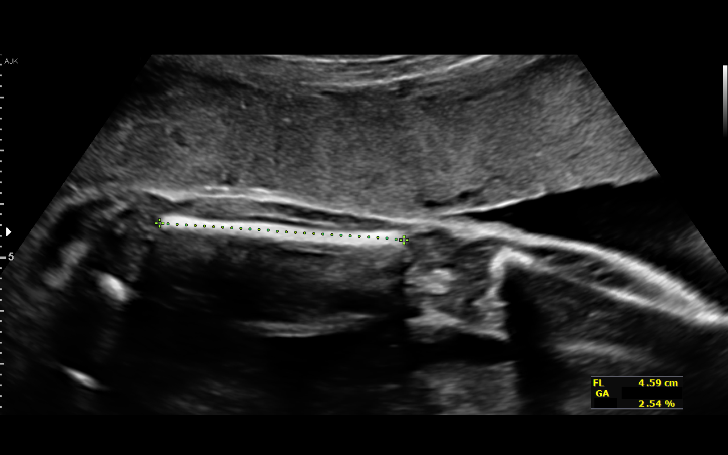
[im 25/96]
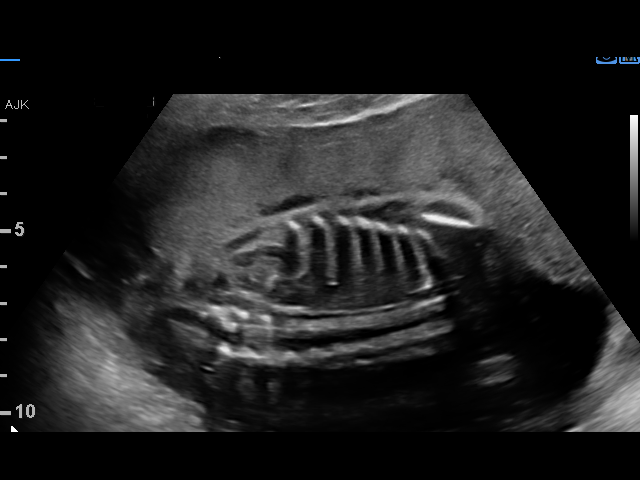
[im 32/96]
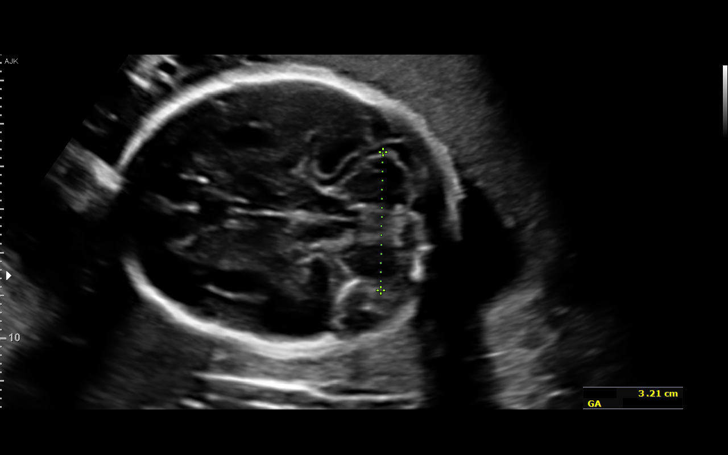
[im 39/96]
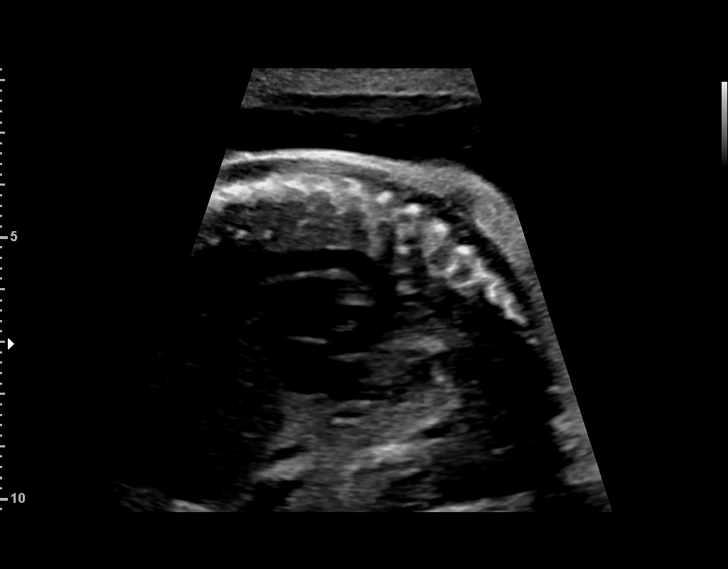
[im 50/96]
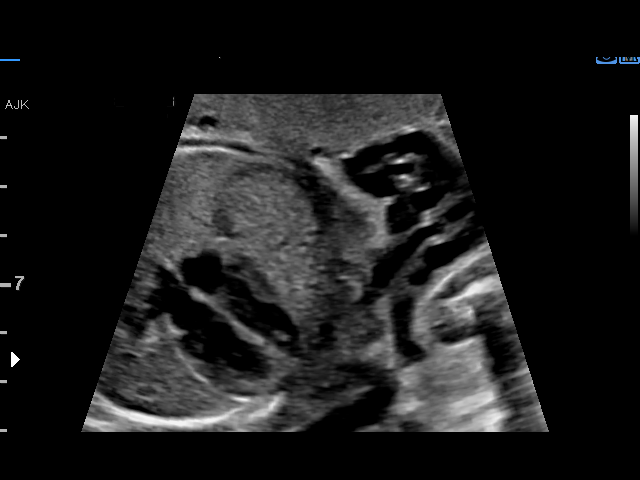
[im 57/96]
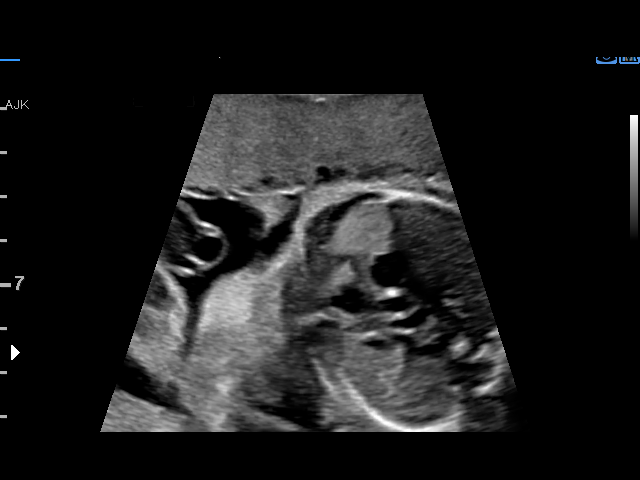
[im 64/96]
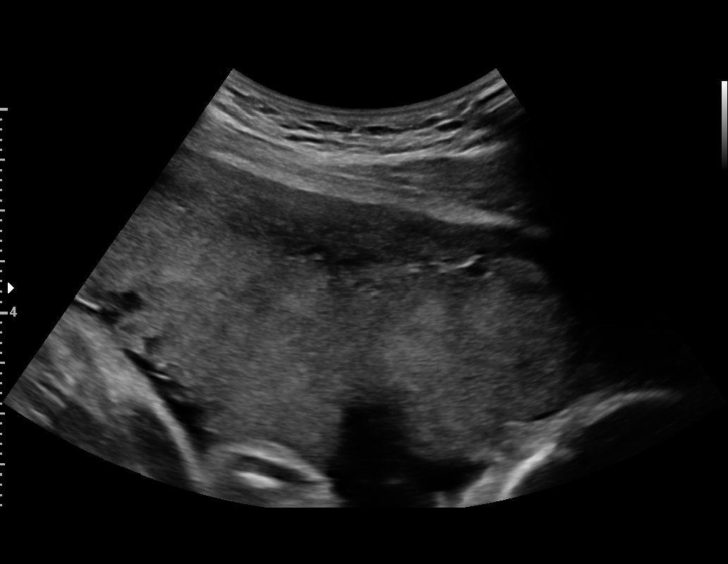
[im 71/96]
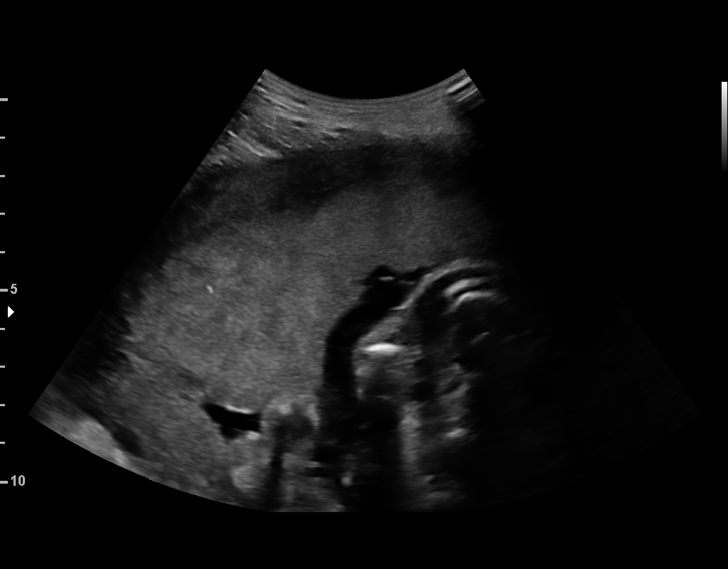
[im 78/96]
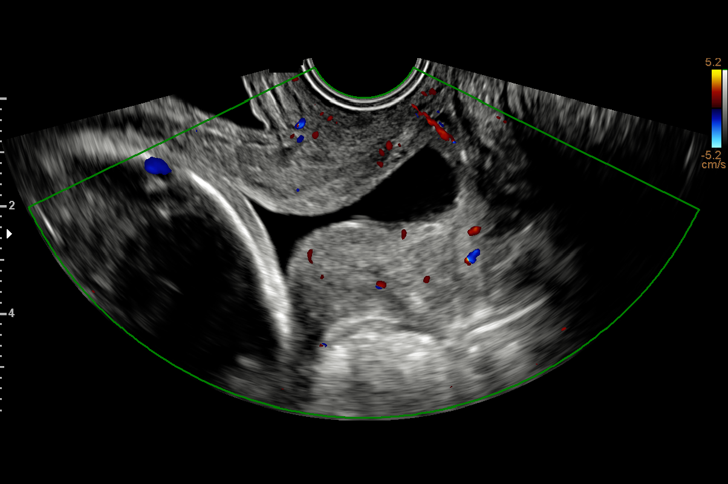
[im 85/96]
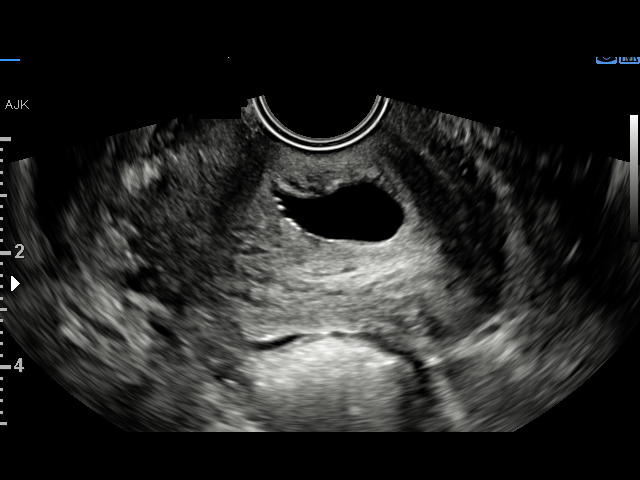
[im 92/96]
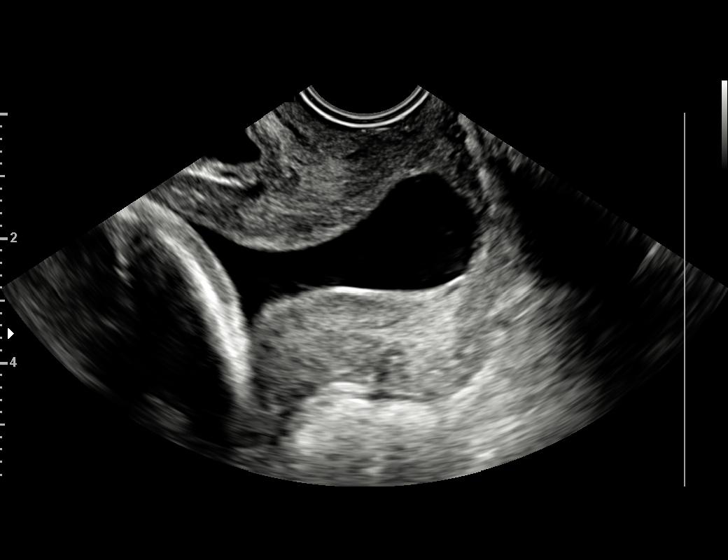

[13 of 28 positions shown; findings below may reference images not displayed]

Obstetrics &
                                                             Gynecology
                                                             [address]
                                                             [place].
                   CNM

  2  US MFM OB TRANSVAGINAL                76817.2     RETICLE BONIC
 ----------------------------------------------------------------------

 ----------------------------------------------------------------------
Indications

  Deep vein thrombosis (DVT) (previous,
  7347; on lovenox now
  Cervical shortening complicating pregnancy
  27 weeks gestation of pregnancy
  Encounter for antenatal screening for
  malformations (low risk NIPs, neg AFP)
  Fetal abnormality - other known or
  suspected (renal pyelectasis-not found)
 ----------------------------------------------------------------------
Vital Signs

 (lb):
 BMI:
Fetal Evaluation

 Num Of Fetuses:          1
 Fetal Heart              145
 Rate(bpm):
 Cardiac Activity:        Observed
 Presentation:            Cephalic
 Placenta:                Anterior
 P. Cord Insertion:       Visualized
 Amniotic Fluid
 AFI FV:      Within normal limits

                             Largest Pocket(cm)

Biometry

 BPD:      64.6  mm     G. Age:  26w 1d         11  %    CI:         75.36  %    70 - 86
                                                         FL/HC:       19.4  %    18.6 -
 HC:        236  mm     G. Age:  25w 4d        < 3  %    HC/AC:       1.09       1.05 -
 AC:      216.9  mm     G. Age:  26w 1d         17  %    FL/BPD:      70.7  %    71 - 87
 FL:       45.7  mm     G. Age:  25w 1d        < 3  %    FL/AC:       21.1  %    20 - 24
 HUM:      43.2  mm     G. Age:  25w 5d         15  %
 CER:      32.7  mm     G. Age:  28w 5d         72  %

 LV:        5.2  mm

 Est. FW:     848   g    1 lb 14 oz      24  %
                    m
OB History

 Gravidity:    1
Gestational Age

 LMP:           27w 2d        Date:  08/11/17                 EDD:    05/18/18
 U/S Today:     25w 5d                                        EDD:    05/29/18
 Best:          27w 1d     Det. By:  Previous Ultrasound      EDD:    05/19/18
                                     (02/06/18)
Anatomy

 Cranium:               Appears normal         Aortic Arch:            Appears normal
 Cavum:                 Appears normal         Ductal Arch:            Not well visualized
 Ventricles:            Appears normal         Diaphragm:              Appears normal
 Choroid Plexus:        Appears normal         Stomach:                Appears normal,
                                                                       left sided
 Cerebellum:            Appears normal         Abdomen:                Appears normal
 Posterior Fossa:       Appears normal         Abdominal Wall:         Appears nml (cord
                                                                       insert, abd wall)
 Nuchal Fold:           Not applicable (>20    Cord Vessels:           Appears normal (3
                        wks GA)                                        vessel cord)
 Face:                  Not well visualized    Kidneys:                Appear normal
 Lips:                  Appears normal         Bladder:                Appears normal
 Thoracic:              Appears normal         Spine:                  Appears normal
 Heart:                 Appears normal         Upper Extremities:      Appears normal
                        (4CH, axis, and
                        situs)
 RVOT:                  Appears normal         Lower Extremities:      Appears normal
 LVOT:                  Appears normal

 Other:  Fetus appears to be a male. RT Heel visualized. Technically difficult
         due to fetal position.
Cervix Uterus Adnexa
 Cervix
 Funneling of internal os noted.

 Uterus
 No abnormality visualized.

 Left Ovary
 Not visualized.

 Right Ovary
 Not visualized.

 Adnexa
 No abnormality visualized.
Comments

 I met with Ms. [HOSPITAL] and her signficant other. I
 discussed the findings of today's exam of cervical
 insufficiency with no functional cervix at this time.
 She will continue nightly vaginal progesterone.
 Discontinue routine cervical length exams but manage
 clinical symptoms including inpatient management.

 Please consultation for details.
Impression

 Normal interval growth.
 No functional cervix
Recommendations

 Follow up as clinically indicated.
 Clincal management recommend

## 2020-01-08 ENCOUNTER — Telehealth: Payer: Self-pay

## 2020-01-08 NOTE — Telephone Encounter (Signed)
Returned pts call as per inbasket message to cancel the 01/12/20 and keep lab and md on 12/9... AOM

## 2020-01-12 ENCOUNTER — Inpatient Hospital Stay: Payer: Medicaid Other

## 2020-01-28 ENCOUNTER — Inpatient Hospital Stay (HOSPITAL_BASED_OUTPATIENT_CLINIC_OR_DEPARTMENT_OTHER): Payer: Medicaid Other | Admitting: Hematology & Oncology

## 2020-01-28 ENCOUNTER — Inpatient Hospital Stay: Payer: Medicaid Other | Attending: Hematology & Oncology

## 2020-01-28 ENCOUNTER — Encounter: Payer: Self-pay | Admitting: Hematology & Oncology

## 2020-01-28 ENCOUNTER — Other Ambulatory Visit: Payer: Self-pay

## 2020-01-28 VITALS — BP 116/55 | HR 80 | Temp 98.7°F | Resp 18 | Wt 149.0 lb

## 2020-01-28 DIAGNOSIS — O99112 Other diseases of the blood and blood-forming organs and certain disorders involving the immune mechanism complicating pregnancy, second trimester: Secondary | ICD-10-CM | POA: Diagnosis not present

## 2020-01-28 DIAGNOSIS — D6862 Lupus anticoagulant syndrome: Secondary | ICD-10-CM

## 2020-01-28 DIAGNOSIS — Z86718 Personal history of other venous thrombosis and embolism: Secondary | ICD-10-CM | POA: Diagnosis present

## 2020-01-28 DIAGNOSIS — Z7901 Long term (current) use of anticoagulants: Secondary | ICD-10-CM | POA: Insufficient documentation

## 2020-01-28 DIAGNOSIS — Z3A24 24 weeks gestation of pregnancy: Secondary | ICD-10-CM | POA: Diagnosis not present

## 2020-01-28 DIAGNOSIS — Z3A10 10 weeks gestation of pregnancy: Secondary | ICD-10-CM

## 2020-01-28 LAB — CMP (CANCER CENTER ONLY)
ALT: 8 U/L (ref 0–44)
AST: 11 U/L — ABNORMAL LOW (ref 15–41)
Albumin: 3.9 g/dL (ref 3.5–5.0)
Alkaline Phosphatase: 58 U/L (ref 38–126)
Anion gap: 7 (ref 5–15)
BUN: 12 mg/dL (ref 6–20)
CO2: 26 mmol/L (ref 22–32)
Calcium: 10 mg/dL (ref 8.9–10.3)
Chloride: 103 mmol/L (ref 98–111)
Creatinine: 0.74 mg/dL (ref 0.44–1.00)
GFR, Estimated: >60 mL/min (ref >60)
Glucose, Bld: 75 mg/dL (ref 70–99)
Potassium: 4.1 mmol/L (ref 3.5–5.1)
Sodium: 136 mmol/L (ref 135–145)
Total Bilirubin: 0.4 mg/dL (ref 0.3–1.2)
Total Protein: 7.1 g/dL (ref 6.5–8.1)

## 2020-01-28 LAB — CBC WITH DIFFERENTIAL (CANCER CENTER ONLY)
Abs Immature Granulocytes: 0.06 10*3/uL (ref 0.00–0.07)
Basophils Absolute: 0 10*3/uL (ref 0.0–0.1)
Basophils Relative: 0 %
Eosinophils Absolute: 0.6 10*3/uL — ABNORMAL HIGH (ref 0.0–0.5)
Eosinophils Relative: 7 %
HCT: 40 % (ref 36.0–46.0)
Hemoglobin: 13.5 g/dL (ref 12.0–15.0)
Immature Granulocytes: 1 %
Lymphocytes Relative: 18 %
Lymphs Abs: 1.7 10*3/uL (ref 0.7–4.0)
MCH: 29.7 pg (ref 26.0–34.0)
MCHC: 33.8 g/dL (ref 30.0–36.0)
MCV: 87.9 fL (ref 80.0–100.0)
Monocytes Absolute: 0.9 10*3/uL (ref 0.1–1.0)
Monocytes Relative: 9 %
Neutro Abs: 6 10*3/uL (ref 1.7–7.7)
Neutrophils Relative %: 65 %
Platelet Count: 232 10*3/uL (ref 150–400)
RBC: 4.55 MIL/uL (ref 3.87–5.11)
RDW: 12.5 % (ref 11.5–15.5)
WBC Count: 9.3 10*3/uL (ref 4.0–10.5)
nRBC: 0 % (ref 0.0–0.2)

## 2020-01-28 LAB — LACTATE DEHYDROGENASE: LDH: 129 U/L (ref 98–192)

## 2020-01-28 NOTE — Progress Notes (Signed)
Hematology and Oncology Follow Up Visit  Bridget Mcbride [id number] 1991/02/19 29 y.o. 01/28/2020   Principle Diagnosis:   2nd trimester pregnancy  History of DVT in the left leg  Current Therapy:    Lovenox 80 mg subcu daily     Interim History:  Bridget Mcbride is back for long awaited follow-up. She is a 24 weeks right now. Everything is going well with the pregnancy. She is doing okay with the Lovenox.  She has had no problems with bleeding. She has had no leg swelling. She has had some nausea but no vomiting. She has had no cough or shortness of breath. She does urinate quite a bit.  She has had no issues with rashes. She has had no back discomfort.  The due date is going to be April 1. She would like to try to do this without a epidural.  I told Bridget Mcbride that typically, most women would like the epidural. If so, we usually switch him over to heparin 2 or 3 weeks before the due date.  She is eating well. She had a good Thanksgiving.  She has a 53-year-old that does not keep Bridget Mcbride busy.  Overall, Bridget Mcbride performance status is ECOG 0.   Medications:  Current Outpatient Medications:  .  Ascorbic Acid (VITAMIN C PO), Take 1 tablet by mouth daily., Disp: , Rfl:  .  Cholecalciferol (VITAMIN D3 PO), Take 1 drop by mouth daily., Disp: , Rfl:  .  diphenhydrAMINE (BENADRYL) 25 mg capsule, Take 25 mg by mouth at bedtime as needed (allergies/sleep.)., Disp: , Rfl:  .  enoxaparin (LOVENOX) 80 MG/0.8ML injection, Inject 80 mg into the skin at bedtime. , Disp: , Rfl:  .  famotidine (PEPCID) 20 MG tablet, Take 20 mg by mouth 2 (two) times daily., Disp: , Rfl:  .  hydroxyprogesterone caproate (MAKENA) 250 mg/mL OIL injection, Inject 250 mg into the muscle once., Disp: , Rfl:  .  loratadine (CLARITIN) 10 MG tablet, Take 10 mg by mouth daily. Claritin RediTabs, Disp: , Rfl:  .  Prenat-Fe Carbonyl-FA-Omega 3 (ONE-A-DAY WOMENS PRENATAL 1) 28-0.8-235 MG CAPS, Take 1 tablet by mouth daily., Disp: ,  Rfl:  .  valACYclovir (VALTREX) 500 MG tablet, Take 500 mg by mouth 2 (two) times daily as needed (outbreaks). , Disp: , Rfl:   Allergies: No Known Allergies  Past Medical History, Surgical history, Social history, and Family History were reviewed and updated.  Review of Systems: Review of Systems  Constitutional: Negative.   HENT:  Negative.   Eyes: Negative.   Respiratory: Negative.   Cardiovascular: Negative.   Gastrointestinal: Negative.   Endocrine: Negative.   Genitourinary: Negative.    Musculoskeletal: Negative.   Skin: Negative.   Neurological: Negative.   Hematological: Negative.   Psychiatric/Behavioral: Negative.     Physical Exam:  weight is 149 lb (67.6 kg). Bridget Mcbride oral temperature is 98.7 F (37.1 C). Bridget Mcbride blood pressure is 116/55 (abnormal) and Bridget Mcbride pulse is 80. Bridget Mcbride respiration is 18 and oxygen saturation is 100%.   Wt Readings from Last 3 Encounters:  01/28/20 149 lb (67.6 kg)  11/25/19 138 lb (62.6 kg)  10/22/19 139 lb (63 kg)    Physical Exam Vitals reviewed.  HENT:     Head: Normocephalic and atraumatic.  Eyes:     Pupils: Pupils are equal, round, and reactive to light.  Cardiovascular:     Rate and Rhythm: Normal rate and regular rhythm.     Heart sounds: Normal heart sounds.  Pulmonary:     Effort: Pulmonary effort is normal.     Breath sounds: Normal breath sounds.  Abdominal:     General: Bowel sounds are normal.     Palpations: Abdomen is soft.     Comments: Abdominal exam may show some slight bulging from Bridget Mcbride pregnancy.  There is no fluid wave.  There is no obvious hepatosplenomegaly.  Musculoskeletal:        General: No tenderness or deformity. Normal range of motion.     Cervical back: Normal range of motion.     Comments: Extremities shows no clubbing, cyanosis or edema.  She has a negative Homans' sign in Bridget Mcbride legs.  I cannot palpate any venous cord in Bridget Mcbride lower legs.  Lymphadenopathy:     Cervical: No cervical adenopathy.  Skin:     General: Skin is warm and dry.     Findings: No erythema or rash.  Neurological:     Mental Status: She is alert and oriented to person, place, and time.  Psychiatric:        Behavior: Behavior normal.        Thought Content: Thought content normal.        Judgment: Judgment normal.      Lab Results  Component Value Date   WBC 9.3 01/28/2020   HGB 13.5 01/28/2020   HCT 40.0 01/28/2020   MCV 87.9 01/28/2020   PLT 232 01/28/2020     Chemistry      Component Value Date/Time   NA 136 01/28/2020 1515   NA 138 12/03/2016 1503   NA 141 04/18/2016 0823   K 4.1 01/28/2020 1515   K 4.1 12/03/2016 1503   K 3.9 04/18/2016 0823   CL 103 01/28/2020 1515   CL 103 12/03/2016 1503   CO2 26 01/28/2020 1515   CO2 29 12/03/2016 1503   CO2 27 04/18/2016 0823   BUN 12 01/28/2020 1515   BUN 13 12/03/2016 1503   BUN 21.1 04/18/2016 0823   CREATININE 0.74 01/28/2020 1515   CREATININE 1.01 08/21/2019 1412   CREATININE 1.1 04/18/2016 0823      Component Value Date/Time   CALCIUM 10.0 01/28/2020 1515   CALCIUM 9.4 12/03/2016 1503   CALCIUM 9.4 04/18/2016 0823   ALKPHOS 58 01/28/2020 1515   ALKPHOS 80 12/03/2016 1503   ALKPHOS 74 04/18/2016 0823   AST 11 (L) 01/28/2020 1515   AST 17 04/18/2016 0823   ALT 8 01/28/2020 1515   ALT 13 04/18/2016 0823   BILITOT 0.4 01/28/2020 1515   BILITOT 0.31 04/18/2016 0823       Impression and Plan: Bridget Mcbride is a very charming 29 year old African-American female.  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  She is doing well with Bridget Mcbride second pregnancy. She is in Bridget Mcbride second trimester. The Lovenox is helping.  Again we talked about the possibility that she will not want a epidural. If that was the case, I will just have Bridget Mcbride take the Lovenox up until delivery and then have Bridget Mcbride stop about 2 days before she is scheduled for delivery.  I told Bridget Mcbride that is much more important for Bridget Mcbride to be on the Lovenox after delivery  since this is when the higher risk of thromboembolic disease happens.  I would like to see Bridget Mcbride back the beginning of March. At that point, she will let us know about whether or not she would like to have the epidural whether or not we  need to switch Bridget Mcbride over to heparin.   [name], MD 12/9/20214:52 PM

## 2020-01-29 ENCOUNTER — Telehealth: Payer: Self-pay | Admitting: Hematology & Oncology

## 2020-01-29 NOTE — Telephone Encounter (Signed)
Appointments scheduled calendar printed & mailed per 12/9 los 

## 2020-03-04 LAB — OB RESULTS CONSOLE HIV ANTIBODY (ROUTINE TESTING): HIV: NONREACTIVE

## 2020-04-07 ENCOUNTER — Telehealth: Payer: Self-pay

## 2020-04-07 NOTE — Telephone Encounter (Signed)
Pt called in to r/s her appt to 05/13/20    Bridget Mcbride

## 2020-04-15 ENCOUNTER — Other Ambulatory Visit (HOSPITAL_COMMUNITY): Payer: Self-pay | Admitting: Obstetrics and Gynecology

## 2020-04-15 DIAGNOSIS — M79605 Pain in left leg: Secondary | ICD-10-CM

## 2020-04-20 ENCOUNTER — Other Ambulatory Visit: Payer: Self-pay

## 2020-04-20 ENCOUNTER — Inpatient Hospital Stay (HOSPITAL_COMMUNITY): Payer: Medicaid Other | Admitting: Anesthesiology

## 2020-04-20 ENCOUNTER — Encounter (HOSPITAL_COMMUNITY): Payer: Self-pay | Admitting: Obstetrics & Gynecology

## 2020-04-20 ENCOUNTER — Inpatient Hospital Stay (HOSPITAL_COMMUNITY)
Admission: AD | Admit: 2020-04-20 | Discharge: 2020-04-25 | DRG: 786 | Disposition: A | Discharge status: Home / Self Care | Payer: Medicaid Other | Attending: Obstetrics & Gynecology | Admitting: Obstetrics & Gynecology

## 2020-04-20 DIAGNOSIS — Z86718 Personal history of other venous thrombosis and embolism: Secondary | ICD-10-CM

## 2020-04-20 DIAGNOSIS — Z7901 Long term (current) use of anticoagulants: Secondary | ICD-10-CM | POA: Diagnosis not present

## 2020-04-20 DIAGNOSIS — O1413 Severe pre-eclampsia, third trimester: Secondary | ICD-10-CM

## 2020-04-20 DIAGNOSIS — Z3A35 35 weeks gestation of pregnancy: Secondary | ICD-10-CM

## 2020-04-20 DIAGNOSIS — I82409 Acute embolism and thrombosis of unspecified deep veins of unspecified lower extremity: Secondary | ICD-10-CM | POA: Diagnosis present

## 2020-04-20 DIAGNOSIS — Z20822 Contact with and (suspected) exposure to covid-19: Secondary | ICD-10-CM | POA: Diagnosis present

## 2020-04-20 DIAGNOSIS — O1410 Severe pre-eclampsia, unspecified trimester: Secondary | ICD-10-CM | POA: Diagnosis present

## 2020-04-20 DIAGNOSIS — O9832 Other infections with a predominantly sexual mode of transmission complicating childbirth: Secondary | ICD-10-CM | POA: Diagnosis present

## 2020-04-20 DIAGNOSIS — O1414 Severe pre-eclampsia complicating childbirth: Principal | ICD-10-CM | POA: Diagnosis present

## 2020-04-20 DIAGNOSIS — Z98891 History of uterine scar from previous surgery: Secondary | ICD-10-CM

## 2020-04-20 DIAGNOSIS — A6000 Herpesviral infection of urogenital system, unspecified: Secondary | ICD-10-CM | POA: Diagnosis present

## 2020-04-20 DIAGNOSIS — O3433 Maternal care for cervical incompetence, third trimester: Secondary | ICD-10-CM | POA: Diagnosis present

## 2020-04-20 LAB — RESP PANEL BY RT-PCR (FLU A&B, COVID) ARPGX2
Influenza A by PCR: NEGATIVE
Influenza B by PCR: NEGATIVE
SARS Coronavirus 2 by RT PCR: NEGATIVE

## 2020-04-20 LAB — CBC
HCT: 43.4 % (ref 36.0–46.0)
Hemoglobin: 14.7 g/dL (ref 12.0–15.0)
MCH: 29.5 pg (ref 26.0–34.0)
MCHC: 33.9 g/dL (ref 30.0–36.0)
MCV: 87 fL (ref 80.0–100.0)
Platelets: 183 10*3/uL (ref 150–400)
RBC: 4.99 MIL/uL (ref 3.87–5.11)
RDW: 13.2 % (ref 11.5–15.5)
WBC: 8.3 10*3/uL (ref 4.0–10.5)
nRBC: 0.2 % (ref 0.0–0.2)

## 2020-04-20 LAB — URINALYSIS, ROUTINE W REFLEX MICROSCOPIC
Bilirubin Urine: NEGATIVE
Glucose, UA: NEGATIVE mg/dL
Ketones, ur: NEGATIVE mg/dL
Leukocytes,Ua: NEGATIVE
Nitrite: NEGATIVE
Protein, ur: >=300 mg/dL — AB
Specific Gravity, Urine: 1.007 (ref 1.005–1.030)
pH: 7 (ref 5.0–8.0)

## 2020-04-20 LAB — COMPREHENSIVE METABOLIC PANEL
ALT: 16 U/L (ref 0–44)
AST: 25 U/L (ref 15–41)
Albumin: 2.8 g/dL — ABNORMAL LOW (ref 3.5–5.0)
Alkaline Phosphatase: 197 U/L — ABNORMAL HIGH (ref 38–126)
Anion gap: 12 (ref 5–15)
BUN: 20 mg/dL (ref 6–20)
CO2: 19 mmol/L — ABNORMAL LOW (ref 22–32)
Calcium: 9.2 mg/dL (ref 8.9–10.3)
Chloride: 105 mmol/L (ref 98–111)
Creatinine, Ser: 1.1 mg/dL — ABNORMAL HIGH (ref 0.44–1.00)
GFR, Estimated: >60 mL/min (ref >60)
Glucose, Bld: 66 mg/dL — ABNORMAL LOW (ref 70–99)
Potassium: 4.4 mmol/L (ref 3.5–5.1)
Sodium: 136 mmol/L (ref 135–145)
Total Bilirubin: 0.5 mg/dL (ref 0.3–1.2)
Total Protein: 6.2 g/dL — ABNORMAL LOW (ref 6.5–8.1)

## 2020-04-20 LAB — TYPE AND SCREEN
ABO/RH(D): B POS
Antibody Screen: NEGATIVE

## 2020-04-20 LAB — PROTEIN / CREATININE RATIO, URINE
Creatinine, Urine: 27.95 mg/dL
Protein Creatinine Ratio: 25.15 mg/mg{Cre} — ABNORMAL HIGH (ref 0.00–0.15)
Total Protein, Urine: 703 mg/dL

## 2020-04-20 LAB — GROUP B STREP BY PCR: Group B strep by PCR: NEGATIVE

## 2020-04-20 MED ORDER — EPHEDRINE 5 MG/ML INJ: 10.0000 mg | INTRAVENOUS | Status: DC | PRN

## 2020-04-20 MED ORDER — LABETALOL HCL 5 MG/ML IV SOLN
40.0000 mg | INTRAVENOUS | Status: DC | PRN
  Administered 2020-04-20: 40 mg via INTRAVENOUS
  Filled 2020-04-20: qty 8

## 2020-04-20 MED ORDER — LABETALOL HCL 5 MG/ML IV SOLN
20.0000 mg | INTRAVENOUS | Status: DC | PRN
  Administered 2020-04-20: 20 mg via INTRAVENOUS
  Filled 2020-04-20: qty 4

## 2020-04-20 MED ORDER — ACETAMINOPHEN 325 MG PO TABS: 650.0000 mg | ORAL_TABLET | ORAL | Status: DC | PRN

## 2020-04-20 MED ORDER — LIDOCAINE HCL (PF) 1 % IJ SOLN
INTRAMUSCULAR | Status: DC | PRN
  Administered 2020-04-20: 4 mL via EPIDURAL
  Administered 2020-04-20: 6 mL via EPIDURAL

## 2020-04-20 MED ORDER — LIDOCAINE HCL (PF) 1 % IJ SOLN: 30.0000 mL | INTRAMUSCULAR | Status: DC | PRN

## 2020-04-20 MED ORDER — SODIUM CHLORIDE 0.9 % IV SOLN
2.0000 g | Freq: Once | INTRAVENOUS | Status: AC
  Administered 2020-04-20: 2 g via INTRAVENOUS
  Filled 2020-04-20: qty 2000

## 2020-04-20 MED ORDER — TERBUTALINE SULFATE 1 MG/ML IJ SOLN: 0.2500 mg | Freq: Once | INTRAMUSCULAR | Status: DC | PRN

## 2020-04-20 MED ORDER — CYCLOBENZAPRINE HCL 5 MG PO TABS
10.0000 mg | ORAL_TABLET | Freq: Once | ORAL | Status: AC
  Administered 2020-04-20: 10 mg via ORAL
  Filled 2020-04-20: qty 2

## 2020-04-20 MED ORDER — LABETALOL HCL 5 MG/ML IV SOLN: 80.0000 mg | INTRAVENOUS | Status: DC | PRN

## 2020-04-20 MED ORDER — SOD CITRATE-CITRIC ACID 500-334 MG/5ML PO SOLN
30.0000 mL | ORAL | Status: DC | PRN
  Administered 2020-04-20 – 2020-04-21 (×2): 30 mL via ORAL
  Filled 2020-04-20 (×2): qty 15

## 2020-04-20 MED ORDER — OXYTOCIN-SODIUM CHLORIDE 30-0.9 UT/500ML-% IV SOLN: 2.5000 [IU]/h | INTRAVENOUS | Status: DC

## 2020-04-20 MED ORDER — SODIUM CHLORIDE 0.9 % IV SOLN: 1.0000 g | INTRAVENOUS | Status: DC

## 2020-04-20 MED ORDER — PHENYLEPHRINE 40 MCG/ML (10ML) SYRINGE FOR IV PUSH (FOR BLOOD PRESSURE SUPPORT)
80.0000 ug | PREFILLED_SYRINGE | INTRAVENOUS | Status: DC | PRN
  Administered 2020-04-21: 80 ug via INTRAVENOUS

## 2020-04-20 MED ORDER — OXYTOCIN BOLUS FROM INFUSION: 333.0000 mL | Freq: Once | INTRAVENOUS | Status: DC

## 2020-04-20 MED ORDER — BETAMETHASONE SOD PHOS & ACET 6 (3-3) MG/ML IJ SUSP
12.0000 mg | Freq: Two times a day (BID) | INTRAMUSCULAR | Status: DC
  Administered 2020-04-21: 12 mg via INTRAMUSCULAR

## 2020-04-20 MED ORDER — HYDRALAZINE HCL 20 MG/ML IJ SOLN
10.0000 mg | INTRAMUSCULAR | Status: DC | PRN
  Administered 2020-04-20: 10 mg via INTRAVENOUS
  Filled 2020-04-20: qty 1

## 2020-04-20 MED ORDER — LACTATED RINGERS IV SOLN
500.0000 mL | Freq: Once | INTRAVENOUS | Status: AC
  Administered 2020-04-21: 250 mL via INTRAVENOUS

## 2020-04-20 MED ORDER — OXYCODONE-ACETAMINOPHEN 5-325 MG PO TABS: 1.0000 | ORAL_TABLET | ORAL | Status: DC | PRN

## 2020-04-20 MED ORDER — ONDANSETRON HCL 4 MG/2ML IJ SOLN: 4.0000 mg | Freq: Four times a day (QID) | INTRAMUSCULAR | Status: DC | PRN

## 2020-04-20 MED ORDER — LACTATED RINGERS IV SOLN
500.0000 mL | INTRAVENOUS | Status: DC | PRN
  Administered 2020-04-20: 1000 mL via INTRAVENOUS
  Administered 2020-04-21: 250 mL via INTRAVENOUS

## 2020-04-20 MED ORDER — OXYTOCIN-SODIUM CHLORIDE 30-0.9 UT/500ML-% IV SOLN
1.0000 m[IU]/min | INTRAVENOUS | Status: DC
  Administered 2020-04-20: 2 m[IU]/min via INTRAVENOUS
  Filled 2020-04-20: qty 500

## 2020-04-20 MED ORDER — FENTANYL-BUPIVACAINE-NACL 0.5-0.125-0.9 MG/250ML-% EP SOLN
12.0000 mL/h | EPIDURAL | Status: DC | PRN
Start: 2020-04-20 — End: 2020-04-21
  Administered 2020-04-20: 12 mL/h via EPIDURAL
  Filled 2020-04-20: qty 250

## 2020-04-20 MED ORDER — ACETAMINOPHEN 500 MG PO TABS
1000.0000 mg | ORAL_TABLET | Freq: Once | ORAL | Status: AC
  Administered 2020-04-20: 1000 mg via ORAL
  Filled 2020-04-20: qty 2

## 2020-04-20 MED ORDER — LABETALOL HCL 5 MG/ML IV SOLN: 40.0000 mg | INTRAVENOUS | Status: DC | PRN

## 2020-04-20 MED ORDER — HYDRALAZINE HCL 20 MG/ML IJ SOLN: 10.0000 mg | INTRAMUSCULAR | Status: DC | PRN

## 2020-04-20 MED ORDER — LABETALOL HCL 5 MG/ML IV SOLN
80.0000 mg | INTRAVENOUS | Status: DC | PRN
  Administered 2020-04-20: 80 mg via INTRAVENOUS
  Filled 2020-04-20: qty 16

## 2020-04-20 MED ORDER — MAGNESIUM SULFATE BOLUS VIA INFUSION
4.0000 g | Freq: Once | INTRAVENOUS | Status: AC
  Administered 2020-04-20: 4 g via INTRAVENOUS
  Filled 2020-04-20: qty 1000

## 2020-04-20 MED ORDER — BETAMETHASONE SOD PHOS & ACET 6 (3-3) MG/ML IJ SUSP
12.0000 mg | INTRAMUSCULAR | Status: DC
  Administered 2020-04-20: 12 mg via INTRAMUSCULAR
  Filled 2020-04-20: qty 5

## 2020-04-20 MED ORDER — LACTATED RINGERS IV SOLN: INTRAVENOUS | Status: DC

## 2020-04-20 MED ORDER — OXYCODONE-ACETAMINOPHEN 5-325 MG PO TABS: 2.0000 | ORAL_TABLET | ORAL | Status: DC | PRN

## 2020-04-20 MED ORDER — PHENYLEPHRINE 40 MCG/ML (10ML) SYRINGE FOR IV PUSH (FOR BLOOD PRESSURE SUPPORT)
80.0000 ug | PREFILLED_SYRINGE | INTRAVENOUS | Status: DC | PRN
  Filled 2020-04-20: qty 10

## 2020-04-20 MED ORDER — DIPHENHYDRAMINE HCL 50 MG/ML IJ SOLN
12.5000 mg | INTRAMUSCULAR | Status: DC | PRN
Start: 2020-04-20 — End: 2020-04-21

## 2020-04-20 MED ORDER — MAGNESIUM SULFATE 40 GM/1000ML IV SOLN
2.0000 g/h | INTRAVENOUS | Status: DC
  Administered 2020-04-20: 2 g/h via INTRAVENOUS
  Filled 2020-04-20: qty 1000

## 2020-04-20 NOTE — Anesthesia Preprocedure Evaluation (Signed)
Anesthesia Evaluation  Patient identified by MRN, date of birth, ID band Patient awake    Reviewed: Allergy & Precautions, H&P , NPO status , Patient's Chart, lab work & pertinent test results  History of Anesthesia Complications Negative for: history of anesthetic complications  Airway Mallampati: II  TM Distance: >3 FB Neck ROM: full    Dental no notable dental hx.    Pulmonary neg pulmonary ROS,    Pulmonary exam normal        Cardiovascular hypertension (pre-eclampsia), Normal cardiovascular exam Rhythm:regular Rate:Normal     Neuro/Psych negative neurological ROS  negative psych ROS   GI/Hepatic Neg liver ROS, GERD  ,  Endo/Other  negative endocrine ROS  Renal/GU negative Renal ROS  negative genitourinary   Musculoskeletal   Abdominal   Peds  Hematology H/o DVT on Lovenox- last dose 04/19/20 @ 2300   Anesthesia Other Findings   Reproductive/Obstetrics (+) Pregnancy                             Anesthesia Physical Anesthesia Plan  ASA: III  Anesthesia Plan: Epidural   Post-op Pain Management:    Induction:   PONV Risk Score and Plan:   Airway Management Planned:   Additional Equipment:   Intra-op Plan:   Post-operative Plan:   Informed Consent: I have reviewed the patients History and Physical, chart, labs and discussed the procedure including the risks, benefits and alternatives for the proposed anesthesia with the patient or authorized representative who has indicated his/her understanding and acceptance.       Plan Discussed with:   Anesthesia Plan Comments:         Anesthesia Quick Evaluation

## 2020-04-20 NOTE — MAU Provider Note (Signed)
History     [id number]  Arrival date and time: 04/20/20 1700    Chief Complaint  Patient presents with  . Hypertension  . Back Pain  . Swelling     HPI Bridget Mcbride is a 30 y.o. 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 Patient started having headache last night, unrelieved with extra strength tylenol. She also started having blurred vision at that time. She has had LE edema x3 weeks, worse over the past couple of days. Today she also noted periorbital edema. She denies cp, sob, palpitations. No fever/chills. No regular contractions. No LOF or VB. +FM. No history of elevated BP this pregnancy or prior pregnancy.  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 Anemia   . BV (bacterial vaginosis)   . Chlamydia   . DVT (deep venous thrombosis) (HCC)    Assoc w/OCP's  . GERD (gastroesophageal reflux disease)   . HSV infection   . IBS (irritable bowel syndrome)     Past Surgical History:  Procedure Laterality Date  . CERVICAL CERCLAGE N/A 11/25/2019   Procedure: CERCLAGE CERVICAL;  Surgeon: Osborn Coho, MD;  Location: [facility];  Service: Gynecology;  Laterality: N/A;  . NO PAST SURGERIES      Family History  Problem Relation Age of Onset  . Migraines Father   . Diabetes Paternal Aunt   . Arthritis Paternal Grandmother   . Hypertension Mother   . Hypertension Maternal Aunt   . Hypertension Maternal Uncle   . Stroke Maternal Uncle   . Hypertension  Maternal Aunt   . Hypertension Maternal Aunt   . Hypertension Maternal Aunt   . Stroke Cousin     Social History   Socioeconomic History  . Marital status: Single    Spouse name: Not on file  . Number of children: Not on file  . Years of education: Not on file  . Highest education level: Not on file  Occupational History  . Not on file  Tobacco Use  . Smoking status: Never Smoker  . Smokeless tobacco: Never Used  Vaping Use  . Vaping Use: Never used  Substance and Sexual Activity  . Alcohol use: Not Currently    Alcohol/week: 0.0 standard drinks    Comment: occasional  . Drug use: No  . Sexual activity: Yes    Birth control/protection: Pill  Other Topics Concern  . Not on file  Social History Narrative  . Not on file   Social Determinants of Health   Financial Resource Strain: Not on file  Food Insecurity: Not on file  Transportation Needs: Not on file  Physical Activity: Not on file  Stress: Not on file  Social Connections: Not  on file  Intimate Partner Violence: Not on file    No Known Allergies  No current facility-administered medications on file prior to encounter.   Current Outpatient Medications on File Prior to Encounter  Medication Sig Dispense Refill  . Cholecalciferol (VITAMIN D3 PO) Take 1 drop by mouth daily.    . diphenhydrAMINE (BENADRYL) 25 mg capsule Take 25 mg by mouth at bedtime as needed (allergies/sleep.).    Marland Kitchen enoxaparin (LOVENOX) 80 MG/0.8ML injection Inject 80 mg into the skin at bedtime.     . famotidine (PEPCID) 20 MG tablet Take 20 mg by mouth 2 (two) times daily.    . hydroxyprogesterone caproate (MAKENA) 250 mg/mL OIL injection Inject 250 mg into the muscle once.    . loratadine (CLARITIN) 10 MG tablet Take 10 mg by mouth daily. Claritin RediTabs    . Prenat-Fe Carbonyl-FA-Omega 3 (ONE-A-DAY WOMENS PRENATAL 1) 28-0.8-235 MG CAPS Take 1 tablet by mouth daily.    . valACYclovir (VALTREX) 500 MG tablet Take 500 mg by mouth 2 (two)  times daily as needed (outbreaks).     . Ascorbic Acid (VITAMIN C PO) Take 1 tablet by mouth daily.       Review of Systems  Constitutional: Negative for chills and fever.  Eyes: Positive for blurred vision. Negative for double vision, photophobia and pain.       Eye swelling   Respiratory: Negative for cough and shortness of breath.   Cardiovascular: Positive for leg swelling. Negative for chest pain and palpitations.  Gastrointestinal: Negative for abdominal pain, nausea and vomiting.  Genitourinary: Negative for dysuria, flank pain, hematuria and urgency.  Skin: Negative for itching and rash.  Neurological: Positive for headaches. Negative for dizziness, speech change, focal weakness, seizures, loss of consciousness and weakness.  Psychiatric/Behavioral: Negative.      Pertinent positives and negative per HPI, all others reviewed and negative  Physical Exam   BP (!) 175/108   Pulse 71   Temp 98.7 F (37.1 C) (Oral)   Resp 16   Ht 5\' 4"  (1.626 m)   Wt 84.9 kg   SpO2 98% Comment: room air  BMI 32.13 kg/m   Physical Exam Vitals and nursing note reviewed. Exam conducted with a chaperone present.  Constitutional:      General: She is not in acute distress.    Appearance: Normal appearance. She is normal weight.  HENT:     Head: Normocephalic and atraumatic.     Nose: Nose normal.     Mouth/Throat:     Mouth: Mucous membranes are moist.     Pharynx: Oropharynx is clear.  Eyes:     Extraocular Movements: Extraocular movements intact.     Conjunctiva/sclera: Conjunctivae normal.     Comments: Periorbital edema noted   Cardiovascular:     Rate and Rhythm: Normal rate.     Pulses: Normal pulses.  Pulmonary:     Effort: Pulmonary effort is normal.  Abdominal:     Comments: Gravid   Musculoskeletal:        General: Normal range of motion.     Cervical back: Normal range of motion and neck supple.     Right lower leg: Edema present.     Left lower leg: Edema  present.     Comments: 2+ pitting edema to knees bilaterally  Skin:    General: Skin is warm and dry.     Capillary Refill: Capillary refill takes less than 2 seconds.  Neurological:  General: No focal deficit present.     Mental Status: She is alert and oriented to person, place, and time. Mental status is at baseline.     Cranial Nerves: No cranial nerve deficit.     Motor: No weakness.  Psychiatric:        Mood and Affect: Mood normal.        Behavior: Behavior normal.        Thought Content: Thought content normal.        Judgment: Judgment normal.     Cervical Exam  not indicated  Bedside Ultrasound Not indicated  FHT Baseline 140, mod variability, +accels, no decels Toco: q5 minutes Cat: 1  Labs No results found for this or any previous visit (from the past 24 hour(s)).  Imaging No results found.  MAU Course  Procedures  Lab Orders     Urinalysis, Routine w reflex microscopic Urine, Clean Catch     Comprehensive metabolic panel     Protein / creatinine ratio, urine     CBC     RPR Meds ordered this encounter  Medications  . AND Linked Order Group   . labetalol (NORMODYNE) injection 20 mg   . labetalol (NORMODYNE) injection 40 mg   . labetalol (NORMODYNE) injection 80 mg   . hydrALAZINE (APRESOLINE) injection 10 mg  . acetaminophen (TYLENOL) tablet 1,000 mg  . cyclobenzaprine (FLEXERIL) tablet 10 mg   Imaging Orders  No imaging studies ordered today    MDM moderate  Assessment and Plan  29 y.o. G2P0101 at [redacted]w[redacted]d presents to MAU for elevated home BP, headaches, blurred vision, and increased lower extremity edema.   #preE with SF 2 severe range BP on presentation warranting admission. She is also symptomatic with the above. Physical exam remarkable for 2+ pitting LE edema and periorbital edema. IV anti-hypertension protocol ordered. preE labs remarkable for p/c 25, Cr 1.1.  Flexeril and tylenol with some relief of patient's headache. Called and  discussed case with Dr. [name] on call for [facility] who will come down and evaluate and admit patient.  #FWB FHT Cat 1 NST: reactive  Alric Seton

## 2020-04-20 NOTE — MAU Note (Signed)
NICU notified of pt's admission to [facility]

## 2020-04-20 NOTE — H&P (Signed)
OB ADMISSION/ HISTORY & PHYSICAL:  Admission Date: 04/20/2020  5:00 PM  Admit Diagnosis: preeclampsia with severe features  Bridget Mcbride is a 30 y.o. female G58P0101 [redacted]w[redacted]d presenting for HTN, HA, visual disturbances and edema. Endorses active FM, denies ctx,  LOF and vaginal bleeding.  History of current pregnancy: G2P0101   Primary OB Provider: [facility] Patient entered care with [facility] at 6.6 wks.   EDC 05/20/20 by 6 wk U/S  Anatomy scan:  21 wks, complete w/ anterior placenta.   Antenatal testing: for Hx of DVT on Lovenox started at 32 weeks Last evaluation: 35  wks  Significant prenatal events: Preeclampsia with SF Patient Active Problem List   Diagnosis Date Noted  . Lupus anticoagulant syndrome (HCC) 08/21/2019  . 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 Posterior right knee pain 03/23/2015  . Inattention 09/26/2014  . Routine general medical examination at a health care facility 08/04/2013    Prenatal Labs: ABO, Rh: --/--/B POS (03/02 1738) Antibody: NEG (03/02 1738) Rubella:   Immune RPR:   NR HBsAg:   Negative HIV:   NR GTT: 91 GBS:   Pending GC/CHL: Negative/Negative Genetics: Panorama low risk, Horizon Negative Tdap/influenza vaccines: Declined/Declined   OB History  Gravida Para Term Preterm AB Living  2 1   1   1   SAB IAB Ectopic Multiple Live Births        0 1    # Outcome Date GA Lbr Len/2nd Weight Sex Delivery Anes PTL Lv  2 Current           1 Preterm 03/19/18 [redacted]w[redacted]d / 00:13 1440 g M Vag-Spont None  LIV    Medical / Surgical History: Past medical history:  Past Medical History:  Diagnosis Date  . Anemia   . BV (bacterial vaginosis)   . Chlamydia   . DVT (deep venous thrombosis) (HCC)    Assoc w/OCP's  . GERD (gastroesophageal reflux disease)   . HSV infection   . IBS (irritable bowel syndrome)     Past surgical history:  Past Surgical History:  Procedure Laterality Date  . CERVICAL CERCLAGE N/A 11/25/2019   Procedure: CERCLAGE CERVICAL;   Surgeon: 01/25/2020, MD;  Location: [facility] LD ORS;  Service: Gynecology;  Laterality: N/A;  . NO PAST SURGERIES     Family History:  Family History  Problem Relation Age of Onset  . Migraines Father   . Diabetes Paternal Aunt   . Arthritis Paternal Grandmother   . Hypertension Mother   . Hypertension Maternal Aunt   . Hypertension Maternal Uncle   . Stroke Maternal Uncle   . Hypertension Maternal Aunt   . Hypertension Maternal Aunt   . Hypertension Maternal Aunt   . Stroke Cousin     Social History:  reports that she has never smoked. She has never used smokeless tobacco. She reports previous alcohol use. She reports that she does not use drugs.  Allergies: Patient has no known allergies.   Current Medications at time of admission:  Prior to Admission medications   Medication Sig Start Date End Date Taking? Authorizing Provider  Cholecalciferol (VITAMIN D3 PO) Take 1 drop by mouth daily.   Yes [provider]  diphenhydrAMINE (BENADRYL) 25 mg capsule Take 25 mg by mouth at bedtime as needed (allergies/sleep.).   Yes [provider]  enoxaparin (LOVENOX) 80 MG/0.8ML injection Inject 80 mg into the skin at bedtime.  09/23/19  Yes [provider]  famotidine (PEPCID) 20 MG tablet Take 20 mg by  mouth 2 (two) times daily.   Yes [provider]  hydroxyprogesterone caproate (MAKENA) 250 mg/mL OIL injection Inject 250 mg into the muscle once.   Yes [provider]  loratadine (CLARITIN) 10 MG tablet Take 10 mg by mouth daily. Claritin RediTabs   Yes [provider]  Prenat-Fe Carbonyl-FA-Omega 3 (ONE-A-DAY WOMENS PRENATAL 1) 28-0.8-235 MG CAPS Take 1 tablet by mouth daily. 12/02/17  Yes [provider]  valACYclovir (VALTREX) 500 MG tablet Take 500 mg by mouth 2 (two) times daily as needed (outbreaks).    Yes [provider]  Ascorbic Acid (VITAMIN C PO) Take 1 tablet by mouth daily.    [provider]     Review of Systems: Constitutional: Negative   HENT: HA Eyes: Edematous, blurred vision Respiratory: Negative   Cardiovascular: Edema to bilateral extremities, hands and face, Hx of DVT on Lovenox. Gastrointestinal: Negative  Genitourinary: Negative for bloody show, negative for LOF   Musculoskeletal: Negative   Skin: Negative   Neurological: Negative   Endo/Heme/Allergies: Negative   Psychiatric/Behavioral: Negative    Physical Exam: VS: Blood pressure (!) 175/111, pulse 76, temperature 98.5 F (36.9 C), temperature source Oral, resp. rate 12, height 5\' 4"  (1.626 m), weight 84.9 kg, SpO2 97 %, unknown if currently breastfeeding. AAO x3, C/O HA, visual disturbances.  Cardiovascular: RRR Respiratory: Lung fields clear to ausculation GU/GI: Abdomen gravid, non-tender, non-distended, active FM, vertex per U/S, EFW 6lbs per Leopold's Extremities: 3+ pitting edema to lower extremities, hands and face, negative clonus, DTR 2+/2+ negative for pain, tenderness, and cords  Cervical exam: 2/60/-3 per Bridget   Prenatal Transfer Tool  Maternal Diabetes: No Genetic Screening: Normal Maternal Ultrasounds/Referrals: Normal Fetal Ultrasounds or other Referrals:  None Maternal Substance Abuse:  No Significant Maternal Medications:  None Significant Maternal Lab Results: Other:  GBS unknown    Assessment: 30 y.o. 26 [redacted]w[redacted]d admitted for Preeclampsia with SF.  Bridget [redacted]w[redacted]d at bedside. R/B of C/S and SVD discussed with pt. Primary cesarean offered. Pt refused at present time.   IOL with pitocin FHR category 1 GBS pending. Ampicillin until resulted. Pain management plan: Epidural PRN Cerclage removed per Bridget Mcbride.  U/S to confirm vertex Sterile spec for Hx of HSV Hx of DVT hold lovenox  Plan:  Admit to L&D Routine admission orders Preeclampsia - MgSO4 4 gm IVB, 2gms/hr, labs q 6 hours Betamethasone 12mg  q 12 hrs x 2 doses Pitocin 2x2 Epidural PRN  Bridget Mcbride and Bridget  notified of admission and plan of care reviewed.   Sallye Mcbride Jere Vanburen MSN, CNM 04/20/2020 7:08 PM

## 2020-04-20 NOTE — Anesthesia Procedure Notes (Signed)
Epidural Patient location during procedure: OB Start time: 04/20/2020 11:03 PM End time: 04/20/2020 11:19 PM  Staffing Anesthesiologist: Lucretia Kern, MD Performed: anesthesiologist   Preanesthetic Checklist Completed: patient identified, IV checked, risks and benefits discussed, monitors and equipment checked, pre-op evaluation and timeout performed  Epidural Patient position: sitting Prep: DuraPrep Patient monitoring: heart rate, continuous pulse ox and blood pressure Approach: midline Location: L3-L4 Injection technique: LOR air  Needle:  Needle type: Tuohy  Needle gauge: 17 G Needle length: 9 cm Needle insertion depth: 7 cm Catheter type: closed end flexible Catheter size: 19 Gauge Catheter at skin depth: 12 cm Test dose: negative  Assessment Events: blood not aspirated, injection not painful, no injection resistance, no paresthesia and negative IV test  Additional Notes Reason for block:procedure for pain

## 2020-04-20 NOTE — Progress Notes (Signed)
Bridget Mcbride is a 30 y.o. G2P0101 at [redacted]w[redacted]d for PreEclampsia with SF. Presented to the MAU with B/P 175/111, HA, Visual distubance - blurred vision, and new onset of edema. Per pt - gained 10 lbs in 4 days. Labetalol protocol initiated in MAU. Dr [name] at bedside. Risk of preeclampsia, Primary c/s and SVD discussed. Pt refusing a primary cesarean. Cerclage removed. MgSO4  4 gm IVB started.  Admitted to L&D for pitocin IOL.   Subjective:  Bridget Mcbride resting quietly with significant other at bedside. States she is feeling much better than when she was in the MAU. Declines HA and blurred vision at present. Preeclampsia risk discussed with pt and husband including seizure, stroke, coma, retinal detachment. Primary cesarean offered. Pt declined. Pt requesting IOL with pitocin. Encouraged epidural placement. Pt agrees. Encouraged low lights, no phones or tv, pt to rest. R/B/A of pitocin reviewed. Pt and husband agree to proceed with pitocin. Magnesium 2gms infusing.   Objective: Vitals:   04/20/20 1846 04/20/20 1922 04/20/20 2001 04/20/20 2022  BP: (!) 175/111 (!) 146/91 (!) 158/100 (!) 149/95  Pulse:  (!) 102 (!) 102 92  Resp: 12 16 20    Temp: 98.5 F (36.9 C) 98.5 F (36.9 C) 98.5 F (36.9 C)   TempSrc: Oral     SpO2: 97%     Weight:      Height:        No intake/output data recorded.    FHT:  FHR: 135 bpm, variability: moderate,  accelerations:  Abscent,  decelerations:  Absent UC:   irregular,  SVE:   Dilation: 2 Effacement (%): 60 Station: -3 Exam by:: Dr. [email or web address]  Labs:   Recent Labs    04/20/20 1738  WBC 8.3  HGB 14.7  HCT 43.4  PLT 183   CMP Latest Ref Rng & Units 04/20/2020 01/28/2020 10/22/2019  Glucose 70 - 99 mg/dL 12/22/2019) 75 73  BUN 6 - 20 mg/dL 20 12 11   Creatinine 0.44 - 1.00 mg/dL 61(P) 5.09(T  Sodium 135 - 145 mmol/L 136 136 134(L)  Potassium 3.5 - 5.1 mmol/L 4.4 4.1 3.9  Chloride 98 - 111 mmol/L 105 103 101  CO2 22 - 32 mmol/L 19(L) 26 27  Calcium 8.9 -  10.3 mg/dL 9.2 2.67 9.7  Total Protein 6.5 - 8.1 g/dL 6.2(L) 7.1 7.1  Total Bilirubin 0.3 - 1.2 mg/dL 0.5 0.4 0.5  Alkaline Phos 38 - 126 U/L 197(H) 58 37(L)  AST 15 - 41 U/L 25 11(L) 11(L)  ALT 0 - 44 U/L 16 8 9    PCR - 25.15  Assessment / Plan: 30 y.o. AGP@ [redacted]w[redacted]d IOL for preeclampsia with SF.   Labor: Pitocin induction 2x2 Preeclampsia:  on magnesium sulfate , labs q 6 hours. Betamethasone 12mg  q 12 hrs x 2 doses Fetal Wellbeing:  Category I Pain Control:  Epidural  PRN I/D:  pending, Ampicillin until PCR resulted. Anticipated MOD:  NSVD  Dr updated on pt status and POC  [facility] MSN, CNM 04/20/2020, 9:31 PM

## 2020-04-20 NOTE — MAU Note (Signed)
Bridget Mcbride is a 30 y.o. at [redacted]w[redacted]d here in MAU reporting: increased swelling and a headache since yesterday. Has tried tylenol with no relief. States swelling is up to her thighs, increased in her hands and face, and states her eyeballs feel swollen. Denies any visual changes. BP at home was 176/111. Also reporting back pain. Denies bleeding and LOF.  Onset of complaint: yesterday  Pain score: headache 8/10, back pain 7/10  Vitals:   04/20/20 1715  BP: (!) 181/106  Pulse: 81  Resp: 16  Temp: 98.7 F (37.1 C)  SpO2: 98%     FHT: +FM  Lab orders placed from triage: UA, pt used bathroom in the lobby and did not give a sample, pt is aware we will need a sample

## 2020-04-21 ENCOUNTER — Encounter (HOSPITAL_COMMUNITY): Payer: Self-pay | Admitting: Obstetrics & Gynecology

## 2020-04-21 ENCOUNTER — Encounter (HOSPITAL_COMMUNITY)
Admission: AD | Disposition: A | Discharge status: Home / Self Care | Payer: Self-pay | Source: Home / Self Care | Attending: Obstetrics & Gynecology

## 2020-04-21 ENCOUNTER — Inpatient Hospital Stay (HOSPITAL_COMMUNITY): Payer: Medicaid Other

## 2020-04-21 DIAGNOSIS — O1410 Severe pre-eclampsia, unspecified trimester: Secondary | ICD-10-CM | POA: Diagnosis present

## 2020-04-21 LAB — CBC
HCT: 40.8 % (ref 36.0–46.0)
HCT: 42.8 % (ref 36.0–46.0)
HCT: 38.2 % (ref 36.0–46.0)
Hemoglobin: 14.7 g/dL (ref 12.0–15.0)
Hemoglobin: 14.4 g/dL (ref 12.0–15.0)
Hemoglobin: 12.8 g/dL (ref 12.0–15.0)
MCH: 30.5 pg (ref 26.0–34.0)
MCH: 29.6 pg (ref 26.0–34.0)
MCH: 29.2 pg (ref 26.0–34.0)
MCHC: 36 g/dL (ref 30.0–36.0)
MCHC: 33.6 g/dL (ref 30.0–36.0)
MCHC: 33.5 g/dL (ref 30.0–36.0)
MCV: 84.6 fL (ref 80.0–100.0)
MCV: 88.1 fL (ref 80.0–100.0)
MCV: 87.2 fL (ref 80.0–100.0)
Platelets: 185 10*3/uL (ref 150–400)
Platelets: 184 10*3/uL (ref 150–400)
Platelets: 166 10*3/uL (ref 150–400)
RBC: 4.82 MIL/uL (ref 3.87–5.11)
RBC: 4.86 MIL/uL (ref 3.87–5.11)
RBC: 4.38 MIL/uL (ref 3.87–5.11)
RDW: 13.2 % (ref 11.5–15.5)
RDW: 13.4 % (ref 11.5–15.5)
RDW: 13.3 % (ref 11.5–15.5)
WBC: 11.3 10*3/uL — ABNORMAL HIGH (ref 4.0–10.5)
WBC: 10.2 10*3/uL (ref 4.0–10.5)
WBC: 11 10*3/uL — ABNORMAL HIGH (ref 4.0–10.5)
nRBC: 0.3 % — ABNORMAL HIGH (ref 0.0–0.2)
nRBC: 0.2 % (ref 0.0–0.2)
nRBC: 0 % (ref 0.0–0.2)

## 2020-04-21 LAB — COMPREHENSIVE METABOLIC PANEL
ALT: 14 U/L (ref 0–44)
ALT: 14 U/L (ref 0–44)
AST: 22 U/L (ref 15–41)
AST: 21 U/L (ref 15–41)
Albumin: 2.4 g/dL — ABNORMAL LOW (ref 3.5–5.0)
Albumin: 2.4 g/dL — ABNORMAL LOW (ref 3.5–5.0)
Alkaline Phosphatase: 192 U/L — ABNORMAL HIGH (ref 38–126)
Alkaline Phosphatase: 198 U/L — ABNORMAL HIGH (ref 38–126)
Anion gap: 10 (ref 5–15)
Anion gap: 11 (ref 5–15)
BUN: 16 mg/dL (ref 6–20)
BUN: 18 mg/dL (ref 6–20)
CO2: 18 mmol/L — ABNORMAL LOW (ref 22–32)
CO2: 20 mmol/L — ABNORMAL LOW (ref 22–32)
Calcium: 8.6 mg/dL — ABNORMAL LOW (ref 8.9–10.3)
Calcium: 8.6 mg/dL — ABNORMAL LOW (ref 8.9–10.3)
Chloride: 105 mmol/L (ref 98–111)
Chloride: 101 mmol/L (ref 98–111)
Creatinine, Ser: 1.26 mg/dL — ABNORMAL HIGH (ref 0.44–1.00)
Creatinine, Ser: 1.31 mg/dL — ABNORMAL HIGH (ref 0.44–1.00)
GFR, Estimated: 59 mL/min — ABNORMAL LOW (ref >60)
GFR, Estimated: 56 mL/min — ABNORMAL LOW (ref >60)
Glucose, Bld: 123 mg/dL — ABNORMAL HIGH (ref 70–99)
Glucose, Bld: 128 mg/dL — ABNORMAL HIGH (ref 70–99)
Potassium: 4.2 mmol/L (ref 3.5–5.1)
Potassium: 5.2 mmol/L — ABNORMAL HIGH (ref 3.5–5.1)
Sodium: 133 mmol/L — ABNORMAL LOW (ref 135–145)
Sodium: 132 mmol/L — ABNORMAL LOW (ref 135–145)
Total Bilirubin: 0.6 mg/dL (ref 0.3–1.2)
Total Bilirubin: 0.7 mg/dL (ref 0.3–1.2)
Total Protein: 5.6 g/dL — ABNORMAL LOW (ref 6.5–8.1)
Total Protein: 5.8 g/dL — ABNORMAL LOW (ref 6.5–8.1)

## 2020-04-21 LAB — RPR: RPR Ser Ql: NONREACTIVE

## 2020-04-21 LAB — MAGNESIUM
Magnesium: 6 mg/dL — ABNORMAL HIGH (ref 1.7–2.4)
Magnesium: >7 mg/dL (ref 1.7–2.4)
Magnesium: 5.4 mg/dL — ABNORMAL HIGH (ref 1.7–2.4)

## 2020-04-21 SURGERY — Surgical Case
Anesthesia: Epidural

## 2020-04-21 MED ORDER — LABETALOL HCL 5 MG/ML IV SOLN
40.0000 mg | INTRAVENOUS | Status: DC | PRN
Start: 2020-04-21 — End: 2020-04-25
  Administered 2020-04-21 – 2020-04-22 (×2): 40 mg via INTRAVENOUS
  Filled 2020-04-21 (×3): qty 8

## 2020-04-21 MED ORDER — WITCH HAZEL-GLYCERIN EX PADS: 1.0000 "application " | MEDICATED_PAD | CUTANEOUS | Status: DC | PRN

## 2020-04-21 MED ORDER — ONDANSETRON HCL 4 MG/2ML IJ SOLN
INTRAMUSCULAR | Status: AC
  Filled 2020-04-21: qty 2

## 2020-04-21 MED ORDER — ZOLPIDEM TARTRATE 5 MG PO TABS: 5.0000 mg | ORAL_TABLET | Freq: Every evening | ORAL | Status: DC | PRN

## 2020-04-21 MED ORDER — NALOXONE HCL 0.4 MG/ML IJ SOLN: 0.4000 mg | INTRAMUSCULAR | Status: DC | PRN

## 2020-04-21 MED ORDER — FENTANYL CITRATE (PF) 100 MCG/2ML IJ SOLN: 100.0000 ug | Freq: Once | INTRAMUSCULAR | Status: AC

## 2020-04-21 MED ORDER — SIMETHICONE 80 MG PO CHEW
80.0000 mg | CHEWABLE_TABLET | Freq: Three times a day (TID) | ORAL | Status: DC
  Administered 2020-04-22 – 2020-04-25 (×10): 80 mg via ORAL
  Filled 2020-04-21 (×10): qty 1

## 2020-04-21 MED ORDER — NALBUPHINE HCL 10 MG/ML IJ SOLN
5.0000 mg | Freq: Once | INTRAMUSCULAR | Status: AC | PRN
  Administered 2020-04-21: 5 mg via INTRAVENOUS

## 2020-04-21 MED ORDER — MENTHOL 3 MG MT LOZG
1.0000 | LOZENGE | OROMUCOSAL | Status: DC | PRN
Start: 2020-04-21 — End: 2020-04-25

## 2020-04-21 MED ORDER — CEFAZOLIN SODIUM-DEXTROSE 2-4 GM/100ML-% IV SOLN
INTRAVENOUS | Status: AC
  Filled 2020-04-21: qty 100

## 2020-04-21 MED ORDER — LABETALOL HCL 5 MG/ML IV SOLN
80.0000 mg | INTRAVENOUS | Status: DC | PRN
Start: 2020-04-21 — End: 2020-04-25
  Administered 2020-04-22: 80 mg via INTRAVENOUS
  Filled 2020-04-21: qty 16

## 2020-04-21 MED ORDER — MAGNESIUM SULFATE 40 GM/1000ML IV SOLN: 1.0000 g/h | INTRAVENOUS | Status: DC

## 2020-04-21 MED ORDER — SENNOSIDES-DOCUSATE SODIUM 8.6-50 MG PO TABS
2.0000 | ORAL_TABLET | ORAL | Status: DC
  Administered 2020-04-23 – 2020-04-25 (×3): 2 via ORAL
  Filled 2020-04-21 (×3): qty 2

## 2020-04-21 MED ORDER — ONDANSETRON HCL 4 MG/2ML IJ SOLN
INTRAMUSCULAR | Status: DC | PRN
  Administered 2020-04-21: 4 mg via INTRAVENOUS

## 2020-04-21 MED ORDER — COCONUT OIL OIL
1.0000 "application " | TOPICAL_OIL | Status: DC | PRN
  Administered 2020-04-24: 1 via TOPICAL

## 2020-04-21 MED ORDER — TETANUS-DIPHTH-ACELL PERTUSSIS 5-2.5-18.5 LF-MCG/0.5 IM SUSY: 0.5000 mL | PREFILLED_SYRINGE | Freq: Once | INTRAMUSCULAR | Status: DC

## 2020-04-21 MED ORDER — HYDRALAZINE HCL 20 MG/ML IJ SOLN
10.0000 mg | INTRAMUSCULAR | Status: DC | PRN
Start: 2020-04-21 — End: 2020-04-25

## 2020-04-21 MED ORDER — DEXAMETHASONE SODIUM PHOSPHATE 10 MG/ML IJ SOLN
INTRAMUSCULAR | Status: AC
  Filled 2020-04-21: qty 1

## 2020-04-21 MED ORDER — LACTATED RINGERS IV SOLN: INTRAVENOUS | Status: DC

## 2020-04-21 MED ORDER — BUPIVACAINE HCL (PF) 0.25 % IJ SOLN
INTRAMUSCULAR | Status: DC | PRN
  Administered 2020-04-21: 8 mL via EPIDURAL

## 2020-04-21 MED ORDER — MAGNESIUM SULFATE 40 GM/1000ML IV SOLN
1.0000 g/h | INTRAVENOUS | Status: AC
  Administered 2020-04-21: 1 g/h via INTRAVENOUS
  Filled 2020-04-21: qty 1000

## 2020-04-21 MED ORDER — FENTANYL CITRATE (PF) 100 MCG/2ML IJ SOLN
INTRAMUSCULAR | Status: DC | PRN
  Administered 2020-04-21 (×2): 100 ug via EPIDURAL

## 2020-04-21 MED ORDER — OXYCODONE HCL 5 MG PO TABS: 5.0000 mg | ORAL_TABLET | Freq: Once | ORAL | Status: DC | PRN

## 2020-04-21 MED ORDER — KETOROLAC TROMETHAMINE 30 MG/ML IJ SOLN
30.0000 mg | Freq: Once | INTRAMUSCULAR | Status: AC | PRN
  Administered 2020-04-21: 30 mg via INTRAVENOUS

## 2020-04-21 MED ORDER — FENTANYL CITRATE (PF) 100 MCG/2ML IJ SOLN
INTRAMUSCULAR | Status: AC
  Administered 2020-04-21: 100 ug via INTRAVENOUS
  Filled 2020-04-21: qty 2

## 2020-04-21 MED ORDER — NALBUPHINE HCL 10 MG/ML IJ SOLN: 5.0000 mg | Freq: Once | INTRAMUSCULAR | Status: AC | PRN

## 2020-04-21 MED ORDER — KETOROLAC TROMETHAMINE 30 MG/ML IJ SOLN
INTRAMUSCULAR | Status: AC
  Filled 2020-04-21: qty 1

## 2020-04-21 MED ORDER — DIPHENHYDRAMINE HCL 50 MG/ML IJ SOLN
12.5000 mg | INTRAMUSCULAR | Status: DC | PRN
  Administered 2020-04-21 – 2020-04-23 (×3): 12.5 mg via INTRAVENOUS
  Filled 2020-04-21 (×3): qty 1

## 2020-04-21 MED ORDER — CEFAZOLIN SODIUM-DEXTROSE 2-3 GM-%(50ML) IV SOLR
INTRAVENOUS | Status: DC | PRN
  Administered 2020-04-21: 2 g via INTRAVENOUS

## 2020-04-21 MED ORDER — OXYTOCIN-SODIUM CHLORIDE 30-0.9 UT/500ML-% IV SOLN
INTRAVENOUS | Status: AC
  Filled 2020-04-21: qty 500

## 2020-04-21 MED ORDER — LABETALOL HCL 5 MG/ML IV SOLN
20.0000 mg | INTRAVENOUS | Status: DC | PRN
Start: 2020-04-21 — End: 2020-04-25
  Administered 2020-04-21 – 2020-04-22 (×4): 20 mg via INTRAVENOUS
  Filled 2020-04-21 (×3): qty 4

## 2020-04-21 MED ORDER — SIMETHICONE 80 MG PO CHEW: 80.0000 mg | CHEWABLE_TABLET | ORAL | Status: DC | PRN

## 2020-04-21 MED ORDER — NALOXONE HCL 4 MG/10ML IJ SOLN
1.0000 ug/kg/h | INTRAVENOUS | Status: DC | PRN
  Filled 2020-04-21: qty 5

## 2020-04-21 MED ORDER — VALACYCLOVIR HCL 500 MG PO TABS
500.0000 mg | ORAL_TABLET | Freq: Every day | ORAL | Status: DC
  Administered 2020-04-21: 500 mg via ORAL
  Filled 2020-04-21: qty 1

## 2020-04-21 MED ORDER — SCOPOLAMINE 1 MG/3DAYS TD PT72
MEDICATED_PATCH | TRANSDERMAL | Status: AC
  Filled 2020-04-21: qty 1

## 2020-04-21 MED ORDER — ONDANSETRON HCL 4 MG/2ML IJ SOLN
4.0000 mg | Freq: Three times a day (TID) | INTRAMUSCULAR | Status: DC | PRN
  Administered 2020-04-21: 4 mg via INTRAVENOUS
  Filled 2020-04-21: qty 2

## 2020-04-21 MED ORDER — ENOXAPARIN SODIUM 120 MG/0.8ML ~~LOC~~ SOLN
120.0000 mg | SUBCUTANEOUS | Status: DC
  Administered 2020-04-22 – 2020-04-25 (×4): 120 mg via SUBCUTANEOUS
  Filled 2020-04-21 (×4): qty 0.8

## 2020-04-21 MED ORDER — DEXAMETHASONE SODIUM PHOSPHATE 10 MG/ML IJ SOLN
INTRAMUSCULAR | Status: DC | PRN
  Administered 2020-04-21: 10 mg via INTRAVENOUS

## 2020-04-21 MED ORDER — FENTANYL CITRATE (PF) 100 MCG/2ML IJ SOLN
INTRAMUSCULAR | Status: AC
  Filled 2020-04-21: qty 2

## 2020-04-21 MED ORDER — MORPHINE SULFATE (PF) 0.5 MG/ML IJ SOLN
INTRAMUSCULAR | Status: DC | PRN
  Administered 2020-04-21: 3 mg via EPIDURAL

## 2020-04-21 MED ORDER — OXYCODONE-ACETAMINOPHEN 5-325 MG PO TABS
1.0000 | ORAL_TABLET | ORAL | Status: DC | PRN
  Administered 2020-04-22: 2 via ORAL
  Administered 2020-04-22 – 2020-04-23 (×2): 1 via ORAL
  Administered 2020-04-23: 2 via ORAL
  Administered 2020-04-23: 1 via ORAL
  Administered 2020-04-23 – 2020-04-24 (×3): 2 via ORAL
  Filled 2020-04-21: qty 1
  Filled 2020-04-21 (×3): qty 2
  Filled 2020-04-21: qty 1
  Filled 2020-04-21 (×2): qty 2
  Filled 2020-04-21: qty 1

## 2020-04-21 MED ORDER — OXYTOCIN-SODIUM CHLORIDE 30-0.9 UT/500ML-% IV SOLN: 2.5000 [IU]/h | INTRAVENOUS | Status: AC

## 2020-04-21 MED ORDER — ONDANSETRON HCL 4 MG/2ML IJ SOLN: 4.0000 mg | Freq: Once | INTRAMUSCULAR | Status: DC | PRN

## 2020-04-21 MED ORDER — MORPHINE SULFATE (PF) 0.5 MG/ML IJ SOLN
INTRAMUSCULAR | Status: AC
  Filled 2020-04-21: qty 10

## 2020-04-21 MED ORDER — DIPHENHYDRAMINE HCL 25 MG PO CAPS
25.0000 mg | ORAL_CAPSULE | ORAL | Status: DC | PRN
  Administered 2020-04-22: 25 mg via ORAL
  Filled 2020-04-21: qty 1

## 2020-04-21 MED ORDER — OXYCODONE HCL 5 MG/5ML PO SOLN: 5.0000 mg | Freq: Once | ORAL | Status: DC | PRN

## 2020-04-21 MED ORDER — PRENATAL MULTIVITAMIN CH
1.0000 | ORAL_TABLET | Freq: Every day | ORAL | Status: DC
  Administered 2020-04-22 – 2020-04-25 (×4): 1 via ORAL
  Filled 2020-04-21 (×4): qty 1

## 2020-04-21 MED ORDER — SODIUM CHLORIDE 0.9% FLUSH: 3.0000 mL | INTRAVENOUS | Status: DC | PRN

## 2020-04-21 MED ORDER — DIPHENHYDRAMINE HCL 25 MG PO CAPS: 25.0000 mg | ORAL_CAPSULE | Freq: Four times a day (QID) | ORAL | Status: DC | PRN

## 2020-04-21 MED ORDER — SCOPOLAMINE 1 MG/3DAYS TD PT72
1.0000 | MEDICATED_PATCH | Freq: Once | TRANSDERMAL | Status: AC
  Administered 2020-04-21: 1.5 mg via TRANSDERMAL

## 2020-04-21 MED ORDER — HYDROMORPHONE HCL 1 MG/ML IJ SOLN: 0.2500 mg | INTRAMUSCULAR | Status: DC | PRN

## 2020-04-21 MED ORDER — MAGNESIUM SULFATE 40 GM/1000ML IV SOLN: 2.0000 g/h | INTRAVENOUS | Status: DC

## 2020-04-21 MED ORDER — DIBUCAINE (PERIANAL) 1 % EX OINT: 1.0000 "application " | TOPICAL_OINTMENT | CUTANEOUS | Status: DC | PRN

## 2020-04-21 MED ORDER — NALBUPHINE HCL 10 MG/ML IJ SOLN
5.0000 mg | INTRAMUSCULAR | Status: DC | PRN
  Administered 2020-04-22 (×3): 5 mg via INTRAVENOUS
  Filled 2020-04-21 (×4): qty 1

## 2020-04-21 MED ORDER — OXYTOCIN-SODIUM CHLORIDE 30-0.9 UT/500ML-% IV SOLN
INTRAVENOUS | Status: DC | PRN
  Administered 2020-04-21: 300 mL via INTRAVENOUS

## 2020-04-21 MED ORDER — LIDOCAINE-EPINEPHRINE (PF) 2 %-1:200000 IJ SOLN
INTRAMUSCULAR | Status: DC | PRN
  Administered 2020-04-21: 5 mL via EPIDURAL
  Administered 2020-04-21: 3 mL via EPIDURAL

## 2020-04-21 MED ORDER — FAMOTIDINE IN NACL 20-0.9 MG/50ML-% IV SOLN
20.0000 mg | Freq: Once | INTRAVENOUS | Status: AC
  Administered 2020-04-21: 20 mg via INTRAVENOUS
  Filled 2020-04-21: qty 50

## 2020-04-21 MED ORDER — NALBUPHINE HCL 10 MG/ML IJ SOLN: 5.0000 mg | INTRAMUSCULAR | Status: DC | PRN

## 2020-04-21 SURGICAL SUPPLY — 45 items
APL SKNCLS STERI-STRIP NONHPOA (GAUZE/BANDAGES/DRESSINGS) ×1
BENZOIN TINCTURE PRP APPL 2/3 (GAUZE/BANDAGES/DRESSINGS) ×2 IMPLANT
CHLORAPREP W/TINT 26ML (MISCELLANEOUS) ×2 IMPLANT
CLAMP CORD UMBIL (MISCELLANEOUS) IMPLANT
CLOSURE STERI STRIP 1/2 X4 (GAUZE/BANDAGES/DRESSINGS) ×2 IMPLANT
CLOTH BEACON ORANGE TIMEOUT ST (SAFETY) ×2 IMPLANT
DRAIN JACKSON PRT FLT 10 (DRAIN) IMPLANT
DRSG OPSITE POSTOP 4X10 (GAUZE/BANDAGES/DRESSINGS) ×2 IMPLANT
ELECT REM PT RETURN 9FT ADLT (ELECTROSURGICAL) ×2
ELECTRODE REM PT RTRN 9FT ADLT (ELECTROSURGICAL) ×1 IMPLANT
EVACUATOR SILICONE 100CC (DRAIN) IMPLANT
EXTRACTOR VACUUM M CUP 4 TUBE (SUCTIONS) IMPLANT
GAUZE SPONGE 4X4 12PLY STRL LF (GAUZE/BANDAGES/DRESSINGS) ×2 IMPLANT
GLOVE BIO SURGEON STRL SZ 6.5 (GLOVE) ×2 IMPLANT
GLOVE BIOGEL PI IND STRL 7.0 (GLOVE) ×2 IMPLANT
GLOVE BIOGEL PI INDICATOR 7.0 (GLOVE) ×2
GOWN STRL REUS W/TWL LRG LVL3 (GOWN DISPOSABLE) ×4 IMPLANT
HEMOSTAT SURGICEL 4X8 (HEMOSTASIS) ×2 IMPLANT
KIT ABG SYR 3ML LUER SLIP (SYRINGE) IMPLANT
NEEDLE HYPO 25X5/8 SAFETYGLIDE (NEEDLE) IMPLANT
NS IRRIG 1000ML POUR BTL (IV SOLUTION) ×2 IMPLANT
PACK C SECTION WH (CUSTOM PROCEDURE TRAY) ×2 IMPLANT
PAD ABD 7.5X8 STRL (GAUZE/BANDAGES/DRESSINGS) ×2 IMPLANT
PAD ABD DERMACEA PRESS 5X9 (GAUZE/BANDAGES/DRESSINGS) ×2 IMPLANT
PAD OB MATERNITY 4.3X12.25 (PERSONAL CARE ITEMS) ×2 IMPLANT
PENCIL SMOKE EVAC W/HOLSTER (ELECTROSURGICAL) ×2 IMPLANT
RETRACTOR WND ALEXIS 25 LRG (MISCELLANEOUS) ×1 IMPLANT
RTRCTR C-SECT PINK 25CM LRG (MISCELLANEOUS) IMPLANT
RTRCTR WOUND ALEXIS 25CM LRG (MISCELLANEOUS) ×2
STRIP CLOSURE SKIN 1/2X4 (GAUZE/BANDAGES/DRESSINGS) ×2 IMPLANT
SUT CHROMIC 0 CT 1 (SUTURE) ×2 IMPLANT
SUT MNCRL AB 3-0 PS2 27 (SUTURE) ×2 IMPLANT
SUT PLAIN 2 0 (SUTURE) ×4
SUT PLAIN 2 0 XLH (SUTURE) ×2 IMPLANT
SUT PLAIN ABS 2-0 CT1 27XMFL (SUTURE) ×2 IMPLANT
SUT SILK 2 0 SH (SUTURE) IMPLANT
SUT VIC AB 0 CTX 36 (SUTURE) ×8
SUT VIC AB 0 CTX36XBRD ANBCTRL (SUTURE) ×4 IMPLANT
SUT VIC AB 2-0 CT1 (SUTURE) ×2 IMPLANT
SUT VIC AB 2-0 SH 27 (SUTURE)
SUT VIC AB 2-0 SH 27XBRD (SUTURE) IMPLANT
TOWEL OR 17X24 6PK STRL BLUE (TOWEL DISPOSABLE) ×2 IMPLANT
TRAY FOLEY W/BAG SLVR 14FR LF (SET/KITS/TRAYS/PACK) ×2 IMPLANT
VACUUM CUP M-STYLE MYSTIC II (SUCTIONS) ×2 IMPLANT
WATER STERILE IRR 1000ML POUR (IV SOLUTION) ×2 IMPLANT

## 2020-04-21 NOTE — Lactation Note (Signed)
This note was copied from a baby's chart. Lactation Consultation Note  Patient Name: Bridget Mcbride Today's Date: 04/21/2020 Age:31 hours  Initial visit to P1 mother of 5 hours old LPTI currently in NICU.   Mother requests lactation consult to be done at a later time today. LC will try to come back at a later time.    Romney Compean A Higuera Ancidey 04/21/2020, 7:05 PM

## 2020-04-21 NOTE — Transfer of Care (Signed)
Immediate Anesthesia Transfer of Care Note  Patient: Bridget Mcbride  Procedure(s) Performed: CESAREAN SECTION (N/A )  Patient Location: PACU  Anesthesia Type:Epidural  Level of Consciousness: awake, alert  and oriented  Airway & Oxygen Therapy: Patient Spontanous Breathing  Post-op Assessment: Report given to RN and Post -op Vital signs reviewed and stable  Post vital signs: Reviewed and stable  Last Vitals:  Vitals Value Taken Time  BP 138/90 04/21/20 1400  Temp    Pulse 73 04/21/20 1405  Resp 16 04/21/20 1405  SpO2 100 % 04/21/20 1405  Vitals shown include unvalidated device data.  Last Pain:  Vitals:   04/21/20 1201  TempSrc: Oral  PainSc:          Complications: No complications documented.

## 2020-04-21 NOTE — Progress Notes (Signed)
Patient ID: Bridget Mcbride, female   DOB: 1990-08-20, 30 y.o.   MRN: [id number]  Pt is sleepy BP (!) 138/95   Pulse 83   Temp 97.6 F (36.4 C) (Oral)   Resp 16   Ht 5\' 4"  (1.626 m)   Wt 84.9 kg   SpO2 97%   BMI 32.13 kg/m  cx 4/80/-1 Cat 2 tracing.  Pitocin stopped. Changed pts position and if no improvement will proceed with CS BP stable  Urine output is good Check mag level Pt agrees with the plan

## 2020-04-21 NOTE — Progress Notes (Signed)
Bridget Mcbride is a 30 y.o. G2P0101 at [redacted]w[redacted]d IOL severe preeclampsia S/p IV labetalol and hydralazine MgSO4 infusing Pitocin at 14 mU Epidural in place Hx of DVT OCP related, previously taking Lovenox Hx of cerclage, removed 04/20/20 Hx of 31 week delivery   Subjective: Comfortable with epidural  Objective: BP (!) 155/96   Pulse 93   Temp 98.7 F (37.1 C) (Oral)   Resp 18   Ht 5\' 4"  (1.626 m)   Wt 84.9 kg   SpO2 97%   BMI 32.13 kg/m  I/O last 3 completed shifts: In: 776.7 [P.O.:200; I.V.:576.7] Out: 1925 [Urine:1925] Total I/O In: 116 [I.V.:116] Out: -   FHT:  FHR: 120 bpm, variability: moderate,  accelerations:  Abscent,  decelerations:  Present early decelerations with contractions UC:   regular, every 4 minutes SVE:   Dilation: 3.5 Effacement (%): 80 Station: -2 Exam by:: Bridget Mcbride, CNM  AROM clear 0810 IUPC and FSE placed  Labs: Lab Results  Component Value Date   WBC 10.2 04/21/2020   HGB 14.4 04/21/2020   HCT 42.8 04/21/2020   MCV 88.1 04/21/2020   PLT 184 04/21/2020    Assessment / Plan: Induction of labor due to preeclampsia,  progressing well on pitocin  Labor: AROM complete. IUPC and FSE placed. Continue pitocin per protocol Preeclampsia:  on magnesium sulfate, no signs or symptoms of toxicity and intake and ouput balanced Fetal Wellbeing:  Category I Pain Control:  Epidural I/D:  n/a Anticipated MOD:  NSVD  Bridget Mcbride Bridget Mcbride 04/21/2020, 8:50 AM

## 2020-04-21 NOTE — Progress Notes (Signed)
Bridget Mcbride is a 30 y.o. G2P0101 at [redacted]w[redacted]d IOL severe preeclampsia S/p IV labetalol and hydralazine MgSO4 infusing Pitocin at 10 mU Epidural in place Hx of DVT OCP related, previously taking Lovenox Hx of cerclage, removed 04/20/20 Hx of 31 week delivery   Subjective: Comfortable with epidural Dr. Normand Mcbride at bedside for POC discussion and exam  Objective: BP (!) 138/95   Pulse 83   Temp 97.6 F (36.4 C) (Oral)   Resp 16   Ht 5\' 4"  (1.626 m)   Wt 84.9 kg   SpO2 97%   BMI 32.13 kg/m  I/O last 3 completed shifts: In: 776.7 [P.O.:200; I.V.:576.7] Out: 1925 [Urine:1925] Total I/O In: 1436.7 [P.O.:440; I.V.:896.7; IV Piggyback:100] Out: 725 [Urine:725]  FHT: 120s, minimal variability, repetitive subtle late decelerations, no accelerations. UC:   regular, every 4 minutes SVE:   Dilation: 4 Effacement (%): 80 Station: -1 Exam by:: Dillard, MD  AROM clear 0810 IUPC and FSE in place  Labs: Lab Results  Component Value Date   WBC 10.2 04/21/2020   HGB 14.4 04/21/2020   HCT 42.8 04/21/2020   MCV 88.1 04/21/2020   PLT 184 04/21/2020    Assessment / Plan: IOL due to preeclampsia No cervical change  Labor: Pitocin break x 30 mins. Restart pitocin per protocol given FHR tracing. If continued late decelerations, cesarean delivery Preeclampsia:  on magnesium sulfate, no signs or symptoms of toxicity and intake and ouput balanced. Mag level requested per Dr. 06/21/2020 Fetal Wellbeing:  Category II Pain Control:  Epidural I/D:  n/a Anticipated MOD:    Bridget Mcbride Bridget Mcbride 04/21/2020, 12:07 PM

## 2020-04-21 NOTE — Progress Notes (Signed)
Offered to try to get pt up out of bed. Pt declined at this time stating, "I feel too weak." Will reassess.

## 2020-04-21 NOTE — Plan of Care (Signed)
  Problem: Coping: Goal: Level of anxiety will decrease Outcome: Progressing   Problem: Elimination: Goal: Will not experience complications related to urinary retention Outcome: Progressing   Problem: Pain Managment: Goal: General experience of comfort will improve Outcome: Progressing   Problem: Education: Goal: Knowledge of disease or condition will improve Outcome: Progressing Goal: Knowledge of the prescribed therapeutic regimen will improve Outcome: Progressing   Problem: Fluid Volume: Goal: Peripheral tissue perfusion will improve Outcome: Progressing   Problem: Clinical Measurements: Goal: Complications related to disease process, condition or treatment will be avoided or minimized Outcome: Progressing   Problem: Education: Goal: Knowledge of condition will improve Outcome: Progressing

## 2020-04-21 NOTE — Op Note (Signed)
Bridget Mcbride   Bridget Mcbride  04/21/2020  Indications: Preeclampsia with severe features  2. Fetal Intolerance to Labor 3. 35 weeks Pre-operative Diagnosis: fetal intolerance to labor .  Preeclampsia with severe features   Post-operative Diagnosis: Same   Surgeon: Surgeon(s) and Role:    * Jaymes Graff, MD - Primary   Assistants: Verdis Prime CNM   Anesthesia: epidural   Procedure Details:  The patient was seen in the Holding Room. The risks, benefits, complications, treatment options, and expected outcomes were discussed with the patient. The patient concurred with the proposed plan, giving informed consent. identified as Bridget Mcbride and the procedure verified as C-Section Delivery. A Time Out was held and the above information confirmed.  After induction of anesthesia, the patient was draped and prepped in the usual sterile manner. A transverse incision was made and carried down through the subcutaneous tissue to the fascia. Fascial incision was made in the midline and extended transversely. The fascia was separated from the underlying rectus muscle superiorly and inferiorly. The peritoneum was identified and entered. Peritoneal incision was extended longitudinally with good visualization of bowel and bladder. The utero-vesical peritoneal reflection was incised transversely and the bladder flap was bluntly freed from the lower uterine segment.  An alexsis retractor was placed in the abdomen.   A low transverse uterine incision was made. The head could not be delivered.  A vacuum was used in the green zone with one pull.  Suction released when the head was delivered.   Delivered from cephalic presentation was a  infant, with Apgar scores of not on delivery Mcbride at one minute and not on delivery Mcbride at five minutes. Cord ph was arterial 7.25 the umbilical cord was clamped and cut cord blood was obtained for evaluation. The placenta was removed Intact  and appeared normal. The uterine outline, tubes and ovaries appeared normal}. The uterine incision was closed with running locked sutures of 0Vicryl. A second layer 0 vicrlyl was used to imbricate the uterine incision    Hemostasis was observed. Lavage was carried out until clear. The alexsis was removed.  The peritoneum was closed with 0 chromic.  The muscles were examined and any bleeders were made hemostatic using bovie cautery device.   The fascia was then reapproximated with running sutures of 0 vicryl.  The subcutaneous tissue was reapproximated  With interrupted stitches using 2-0 plain gut. The subcuticular closure was performed using 3-16monocryl     Instrument, sponge, and needle counts were correct prior the abdominal closure and were correct at the conclusion of the case.    Findings: infant was delivered from vtx presentation. The fluid was clear.  The uterus tubes and ovaries appeared normal.     Estimated Blood Loss: 250cc   Total IV Fluids:   Urine Output: 1075CC OF clear urine  Specimens: placenta to pathology  Complications: no complications  Disposition: PACU - hemodynamically stable.   Maternal Condition: stable   Baby condition / location:  NICU  Attending Attestation: I performed the procedure.   Signed: Surgeon(s): Jaymes Graff, MD

## 2020-04-21 NOTE — Progress Notes (Signed)
Subjective:    Bridget Mcbride is resting comfortably with epidural in place. B/P 123/71-140/90. Denies HA, blurred vision or epigastric pain at present. SVE 2/75/-2. CAT 1 now,  some intermittent minimal variability at times. CBC- stable, plts 185. Awaiting CMP. Lab called. CMP is in process, late d/t lab delays.   Objective:    VS: BP 140/90   Pulse 86   Temp 98.8 F (37.1 C) (Oral)   Resp 18   Ht 5\' 4"  (1.626 m)   Wt 84.9 kg   SpO2 97%   BMI 32.13 kg/m  FHR : baseline 120 / variability moderate / accelerations 10x10 / absent decelerations Toco: contractions every 1-5 minutes  Membranes: intact Dilation: 2 Effacement (%): 70,80 Station: -2 Presentation: Vertex Exam by:: Kim CNM Pitocin  40mU/min  Assessment/Plan:   30 y.o. 26 [redacted]w[redacted]d IOL for preeclampsia with SF  Labor: Progressing on Pitocin, will continue to increase then AROM Preeclampsia:  on magnesium sulfate Fetal Wellbeing:  Category I Pain Control:  Epidural I/D:  GBS negative Anticipated MOD:  NSVD   [name] updated on pt status and POC  Su Hilt MSN, CNM 04/21/2020 3:17 AM

## 2020-04-21 NOTE — Anesthesia Postprocedure Evaluation (Signed)
Anesthesia Post Note  Patient: Bridget Mcbride  Procedure(s) Performed: CESAREAN SECTION (N/A )     Patient location during evaluation: Mother Baby Anesthesia Type: Epidural Level of consciousness: oriented and awake and alert Pain management: pain level controlled Vital Signs Assessment: post-procedure vital signs reviewed and stable Respiratory status: spontaneous breathing and respiratory function stable Cardiovascular status: blood pressure returned to baseline and stable Postop Assessment: no headache, no backache, no apparent nausea or vomiting and able to ambulate Anesthetic complications: no   No complications documented.  Last Vitals:  Vitals:   04/21/20 1527 04/21/20 1647  BP: (!) 157/99 (!) 157/99  Pulse: 71 70  Resp:    Temp: (!) 36.4 C   SpO2: 97% 97%    Last Pain:  Vitals:   04/21/20 1527  TempSrc: Oral  PainSc: 1    Pain Goal:                   Trevor Iha

## 2020-04-22 ENCOUNTER — Ambulatory Visit (HOSPITAL_COMMUNITY): Admission: RE | Admit: 2020-04-22 | Payer: Medicaid Other | Source: Ambulatory Visit

## 2020-04-22 DIAGNOSIS — I82409 Acute embolism and thrombosis of unspecified deep veins of unspecified lower extremity: Secondary | ICD-10-CM | POA: Diagnosis present

## 2020-04-22 DIAGNOSIS — Z98891 History of uterine scar from previous surgery: Secondary | ICD-10-CM

## 2020-04-22 LAB — COMPREHENSIVE METABOLIC PANEL
ALT: 17 U/L (ref 0–44)
AST: 24 U/L (ref 15–41)
Albumin: 2.3 g/dL — ABNORMAL LOW (ref 3.5–5.0)
Alkaline Phosphatase: 156 U/L — ABNORMAL HIGH (ref 38–126)
Anion gap: 9 (ref 5–15)
BUN: 20 mg/dL (ref 6–20)
CO2: 20 mmol/L — ABNORMAL LOW (ref 22–32)
Calcium: 7.9 mg/dL — ABNORMAL LOW (ref 8.9–10.3)
Chloride: 103 mmol/L (ref 98–111)
Creatinine, Ser: 1.14 mg/dL — ABNORMAL HIGH (ref 0.44–1.00)
GFR, Estimated: >60 mL/min (ref >60)
Glucose, Bld: 125 mg/dL — ABNORMAL HIGH (ref 70–99)
Potassium: 5 mmol/L (ref 3.5–5.1)
Sodium: 132 mmol/L — ABNORMAL LOW (ref 135–145)
Total Bilirubin: 0.6 mg/dL (ref 0.3–1.2)
Total Protein: 5 g/dL — ABNORMAL LOW (ref 6.5–8.1)

## 2020-04-22 LAB — CBC
HCT: 36 % (ref 36.0–46.0)
Hemoglobin: 12.3 g/dL (ref 12.0–15.0)
MCH: 29.8 pg (ref 26.0–34.0)
MCHC: 34.2 g/dL (ref 30.0–36.0)
MCV: 87.2 fL (ref 80.0–100.0)
Platelets: 178 10*3/uL (ref 150–400)
RBC: 4.13 MIL/uL (ref 3.87–5.11)
RDW: 13.4 % (ref 11.5–15.5)
WBC: 16.1 10*3/uL — ABNORMAL HIGH (ref 4.0–10.5)
nRBC: 0.1 % (ref 0.0–0.2)

## 2020-04-22 MED ORDER — LABETALOL HCL 200 MG PO TABS: 200.0000 mg | ORAL_TABLET | Freq: Three times a day (TID) | ORAL | Status: DC

## 2020-04-22 MED ORDER — LABETALOL HCL 200 MG PO TABS
200.0000 mg | ORAL_TABLET | Freq: Three times a day (TID) | ORAL | Status: DC
  Administered 2020-04-22 – 2020-04-23 (×5): 200 mg via ORAL
  Filled 2020-04-22 (×5): qty 1

## 2020-04-22 NOTE — Lactation Note (Signed)
This note was copied from a baby's chart. Lactation Consultation Note  Patient Name: Girl Traniya Prichett [id number] Date: 04/22/2020 Reason for consult: Follow-up assessment;Mother's request;NICU baby Age:31 hours   1400 - RN (OB RN) called lactation to NICU room to discuss breast feeding with Ms. Cabacungan. The ped (MD) and NNP were in the room at this time. They assisted with placing baby with Ms. Langham. NNP informed me that baby was not cueing at this time and to delay breast feeding attempt at this time.   I encouraged Ms. Taubman to initiate pumping when she returned to her room. Her RN stated that she would help Ms. Natarajan initiate pumping after they returned. I encouraged her to call lactation today as needed.  More education (including review of the NICU [place] guide) is warranted tomorrow (3/5) when patient is ready.  Interventions Interventions: Education  Consult Status Consult Status: Follow-up Date: 04/23/20 Follow-up type: In-patient    Walker Shadow 04/22/2020, 3:15 PM

## 2020-04-22 NOTE — Lactation Note (Signed)
This note was copied from a baby's chart. Lactation Consultation Note  Patient Name: Girl Zayana Salvador Today's Date: 04/22/2020 Reason for consult: Initial assessment;NICU baby;Late-preterm 34-36.6wks;Infant < 6lbs Age:31 hours  0821 - 0831 - I conducted an initial visit with Ms. Halberstam. RN (OB) and support person were present. Her RN informed me that Ms. Brockmann's BP was currently too high to initiate pumping. She states that she will initiate pumping with her BP is at an acceptable level. I encouraged her to call lactation today for follow up.  Ms. Spink invited me to speak with her briefly. I obtained a breast feeding history at this visit. Ms. Bunyan states that she has a 31 year old at home who was born preterm and spent 6 weeks in the NICU. They had difficulty establishing a latch due to multiple interventions during this stay, including thickened feeds due to reflux and fast paced bottle feeding (which Ms. Hattery attributes to baby's breast refusal).  She gave this history because she wants lactation to know that it is her desire to exclusively breast feed with her current daughter. She would like to initiate STS and pumping and hopefully latch baby as soon as possible.  I dropped off the NICU booklet and breast feeding brochure. I encouraged the RN and Ms. Beets to call when ready for more education and assistance.   Maternal Data Does the patient have breastfeeding experience prior to this delivery?: Yes How long did the patient breastfeed?:  (pumped 9 months)  Feeding Mother's Current Feeding Choice: Breast Milk and Donor Milk   Lactation Tools Discussed/Used Tools: Other (comment) (DEBP in the room; not initiated)  Interventions Interventions: Education (maternal history taken)  Discharge Pump: Personal (Used personal - to discuss other options at next visit) WIC Program:  ([facility] [employer])  Consult Status Consult Status:  Follow-up Date: 04/22/20 Follow-up type: In-patient    Walker Shadow 04/22/2020, 9:00 AM

## 2020-04-22 NOTE — Progress Notes (Signed)
Patient screened out for psychosocial assessment since none of the following apply: °Psychosocial stressors documented in mother or baby's chart °Gestation less than 32 weeks °Code at delivery  °Infant with anomalies °Please contact the Clinical Social Worker if specific needs arise, by MOB's request, or if MOB scores greater than 9/yes to question 10 on Edinburgh Postpartum Depression Screen. ° °Safa Derner Boyd-Gilyard, MSW, LCSW °Clinical Social Work °[phone] °  °

## 2020-04-22 NOTE — Progress Notes (Addendum)
Bridget Mcbride [id number] Postpartum Postoperative Day # [address] [place], [place], [redacted]w[redacted]d, S/P Primary LT Cesarean Section due to PreE with SF and NRFHT. Pt presented on 3/2 was admitted for removal of cerclage for incompetent cervix and IOL at 35.5 wg for preE with SF, pt required round of IV labetalol during labor, started on mag, mag level increased to 7, therefore mag discontinued on 3/2, pt progress with Pitocin and AROM, but due to NRFHT Dr Normand Sloop proceeded with PCS on 3/3, EBL was hgb drop of 12.8-12.3. Pt then restarted on PP mage at 1gm/hr at 2000 on 3/3, will be d/c'ed at 24s on 3/4 @ 2000. BP continue to be in SF PP on mag, IV labetalol was given again on 3/4 around 0300, then 200mg  PO labetalol TID was started, around 0830 IV labetalol was required aged. Since BP <160/110, asymptomatic, BP now 142/100, asymptomatic. 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   Subjective: Patient up ad lib, denies syncope or dizziness. Reports consuming regular diet without issues and denies N/V. Patient reports 0 bowel movement + passing flatus.  Denies issues with urination and reports bleeding is "lighter."  Patient is breastfeeding and reports going well.  Desires undecided for postpartum contraception.  Pain is being appropriately managed with use of po meds. Denies HA, RUQ pain or vision changes.    Objective: Patient Vitals for the past 24 hrs:  BP Temp Temp src Pulse Resp SpO2  04/22/20 1543 (!) 146/83 97.8 F (36.6 C) Oral 67 18 98 %  04/22/20 1129 (!) 149/90 97.7 F (36.5 C) Oral 70 18 98 %  04/22/20 0900 (!) 142/100 (!) 97.4 F (36.3 C) Oral 70 18 --  04/22/20 0843 (!) 169/97 -- -- 68 -- --  04/22/20 0831 (!) 179/105 -- -- 76 -- --  04/22/20 0815 (!) 174/98 -- -- 71 -- --  04/22/20 0600 (!) 151/95 -- -- 62 18 97 %  04/22/20 0500 -- -- -- -- -- 97 %  04/22/20 0406 (!) 157/91 -- -- (!) 59 18 100 %  04/22/20 0400 -- -- -- -- -- 97 %   04/22/20 0345 (!) 167/101 -- -- (!) 58 18 96 %  04/22/20 0328 (!) 178/93 -- -- (!) 52 -- 97 %  04/22/20 0201 (!) 156/100 97.6 F (36.4 C) Oral (!) 57 18 99 %  04/22/20 0101 (!) 143/96 98 F (36.7 C) Oral (!) 57 18 99 %  04/22/20 0040 (!) 150/104 -- -- (!) 52 -- --  04/22/20 0012 (!) 170/100 -- -- 61 -- --  04/22/20 0000 (!) 151/112 -- -- 68 18 --  04/21/20 2300 (!) 143/93 -- -- (!) 59 -- 97 %  04/21/20 2246 (!) 156/98 -- -- 74 18 --  04/21/20 2200 (!) 165/104 -- -- 70 18 99 %  04/21/20 2115 (!) 146/92 -- -- 62 -- 95 %  04/21/20 2100 (!) 167/102 -- -- (!) 57 -- 99 %  04/21/20 2016 (!) 174/103 97.7 F (36.5 C) Oral 65 18 96 %  04/21/20 1800 -- -- -- -- -- 97 %  04/21/20 1700 -- -- -- -- -- 96 %  04/21/20 1647 (!) 157/99 -- -- 70 -- 97 %     Physical Exam:  General: alert, cooperative, appears stated age and no distress Mood/Affect: fatigued Lungs: clear to auscultation, no wheezes, rales or rhonchi, symmetric air entry.  Heart: normal rate, regular rhythm, normal S1, S2, no  murmurs, rubs, clicks or gallops. Breast: breasts appear normal, no suspicious masses, no skin or nipple changes or axillary nodes. Abdomen:  + bowel sounds, soft, non-tender Incision: healing well, no significant drainage, no dehiscence, no significant erythema, Honeycomb dressing  Uterine Fundus: firm, involution -1 Lochia: appropriate Skin: Warm, Dry. DVT Evaluation: No evidence of DVT seen on physical exam. Negative Homan's sign. No cords or calf tenderness. 2+ pitting edema to bilateral extremities, no clonus with 1+ Patellar DTR.   Labs: Recent Labs    04/21/20 0641 04/21/20 1422 04/22/20 0511  HGB 14.4 12.8 12.3  HCT 42.8 38.2 36.0  WBC 10.2 11.0* 16.1*    CBG (last 3)  No results for input(s): GLUCAP in the last 72 hours.   I/O: I/O last 3 completed shifts: In: 6101.7 [P.O.:1600; I.V.:4401.7; IV Piggyback:100] Out: 4570 [Urine:4320; Blood:250]   Assessment Postpartum Postoperative  Day # 1  [place], [id number], [redacted]w[redacted]d, S/P Primary LT Cesarean Section due to PreE with SF and NRFHT. Pt presented on 3/2 was admitted for removal of cerclage for incompetent cervix and IOL at 35.5 wg for preE with SF, pt required round of IV labetalol during labor, started on mag, mag level increased to 7, therefore mag discontinued on 3/2, pt progress with Pitocin and AROM, but due to NRFHT Dr Normand Sloop proceeded with PCS on 3/3, EBL was hgb drop of 12.8-12.3. Pt then restarted on PP mage at 1gm/hr at 2000 on 3/3, will be d/c'ed at 24s on 3/4 @ 2000. BP continue to be in SF PP on mag, IV labetalol was given again on 3/4 around 0300, then 200mg  PO labetalol TID was started, around 0830 IV labetalol was required aged. Since BP <160/110, asymptomatic, BP now 142/100, asymptomatic. 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 .  Pt stable. -1 Involution. Breast Pumping Feeding. Hemodynamically Stable.   Plan: Continue other mgmt as ordered H/O DVT: start lovenox 120mg  today and continue until 6 weeks PP.  PreE: if SR >160/110 occur again will increase labetalol anticipate need to d/c labetalol on 3/5 and start procardia 60mg  XL PO daily when mag off, mag off at 2000. Monitor BP and give IV labetalol as needed for SR Bps.  VTE Prophylactics: SCD, ambulated as tolerates.  Pain control: Motrin/Tylenol/Narcotics PRN Education given regarding options for contraception, including barrier methods, injectable contraception, IUD placement, oral contraceptives.  Lactation consult  Anticipate discharge on POD#3  Dr. to be updated on patient status  [facility] NP-C, CNM 04/22/2020, 4:35 PM

## 2020-04-23 LAB — COMPREHENSIVE METABOLIC PANEL
ALT: 18 U/L (ref 0–44)
AST: 20 U/L (ref 15–41)
Albumin: 2.2 g/dL — ABNORMAL LOW (ref 3.5–5.0)
Alkaline Phosphatase: 128 U/L — ABNORMAL HIGH (ref 38–126)
Anion gap: 6 (ref 5–15)
BUN: 23 mg/dL — ABNORMAL HIGH (ref 6–20)
CO2: 25 mmol/L (ref 22–32)
Calcium: 7.6 mg/dL — ABNORMAL LOW (ref 8.9–10.3)
Chloride: 103 mmol/L (ref 98–111)
Creatinine, Ser: 1.1 mg/dL — ABNORMAL HIGH (ref 0.44–1.00)
GFR, Estimated: >60 mL/min (ref >60)
Glucose, Bld: 74 mg/dL (ref 70–99)
Potassium: 4.7 mmol/L (ref 3.5–5.1)
Sodium: 134 mmol/L — ABNORMAL LOW (ref 135–145)
Total Bilirubin: 0.5 mg/dL (ref 0.3–1.2)
Total Protein: 4.7 g/dL — ABNORMAL LOW (ref 6.5–8.1)

## 2020-04-23 MED ORDER — NIFEDIPINE ER OSMOTIC RELEASE 30 MG PO TB24
60.0000 mg | ORAL_TABLET | Freq: Every day | ORAL | Status: DC
  Administered 2020-04-23: 60 mg via ORAL
  Filled 2020-04-23: qty 2

## 2020-04-23 MED ORDER — FAMOTIDINE 20 MG PO TABS
20.0000 mg | ORAL_TABLET | Freq: Two times a day (BID) | ORAL | Status: DC
  Administered 2020-04-23 – 2020-04-25 (×5): 20 mg via ORAL
  Filled 2020-04-23 (×6): qty 1

## 2020-04-23 MED ORDER — FAMOTIDINE 20 MG PO TABS: 20.0000 mg | ORAL_TABLET | Freq: Every day | ORAL | Status: DC

## 2020-04-23 NOTE — Lactation Note (Signed)
This note was copied from a baby's chart. Lactation Consultation Note  Patient Name: Girl Darriona Dehaas [id number] Date: 04/23/2020 Reason for consult: Follow-up assessment;Late-preterm 34-36.6wks;NICU baby;Infant < 6lbs Age:31 hours   G2P2 mom with 95 hr old LPI baby girl; baby was swaddled with dad upon entry into the NICU. Discussed results of pumping; mom admitted that pumping frequently was a task due to recovery and medications.   Mom described that they were seeing condensation around the flanges, but no drops had formed yet. Praised them for their efforts and was encouraged to do what they could and get as close to 8x/24 hrs as possible. Discussed pumping education, storage of milk and cleaning of pump parts.Mom was familiar from previous experience.   Mom says baby cues occasionally and is interested in working with latching baby the following day when they have more energy and is feeling better. Also discussed recovery and healing as a part of the process. No questions were brought to the attention of LC at this time. Mom was going to be heading back to their room.  Alarik Radu Terrilee Croak, MS, ACSM EP-C  Interventions Interventions: Education;Skin to skin  Discharge Pump: DEBP WIC Program: No  Consult Status Consult Status: Follow-up Date: 04/24/20    Sherlonda Flater 04/23/2020, 5:13 PM

## 2020-04-23 NOTE — Progress Notes (Signed)
Subjective: POD# 2 Information for the patient's newborn:  Bridget Mcbride, Bridget Mcbride [[id number]]  female    Baby's Name [facility] Circumcision N/A  Reports feeling tired, tolerating transition from Labetalol PO to Procardia PO w/o complaints Feeding: pumping, infant in NICU and tube feeding Reports tolerating PO and denies N/V, foley removed, ambulating and urinating w/o difficulty  Pain controlled with PO meds Denies HA/SOB/dizziness  Flatus passing Vaginal bleeding is normal, no clots     Objective:  VS:  Vitals:   04/22/20 1940 04/23/20 0010 04/23/20 0340 04/23/20 0919  BP:  (!) 159/93 (!) 155/89 (!) 159/95  Pulse: 69 79 77 79  Resp: 20 20    Temp: 98.3 F (36.8 C) 98 F (36.7 C) 98.4 F (36.9 C) 99 F (37.2 C)  TempSrc: Oral Oral Oral Oral  SpO2: 99% 100% 99% 100%  Weight:      Height:        Intake/Output Summary (Last 24 hours) at 04/23/2020 1242 Last data filed at 04/23/2020 0543 Gross per 24 hour  Intake 460 ml  Output 3750 ml  Net -3290 ml     Recent Labs    04/21/20 1422 04/22/20 0511  WBC 11.0* 16.1*  HGB 12.8 12.3  HCT 38.2 36.0  PLT 166 178    Blood type: --/--/B POS (03/02 1738) Rubella: Immune (08/31 0000)    Physical Exam:  General: alert, cooperative and no distress CV: Regular rate and rhythm Resp: clear Abdomen: soft, nontender, normal bowel sounds Incision: pressure dressing removed, honeycomb w/ 3cm area of serosanguineous drainage Uterine Fundus: firm, below umbilicus, nontender Lochia: appropriate Ext: 1+ pitting edema, neg for pain, tenderness, or cords   Assessment/Plan: 30 y.o.   POD# 2. [id number]                   Pre-eclampsia w/ severe features    -S/P PP MgSO4 stopped 04/22/20 @ 1959    -labile BP managed w/ Procadria XL 60 mg PO daily Hx DVT     -prophylactic Lovenox 120 mg SQ daily Routine post-op PP care          Advised ambulation to reduce risk of DVT Lactation support Anticipate D/C 04/24/20   Bridget Schanz,  MSN, CNM 04/23/2020, 12:42 PM

## 2020-04-24 LAB — CREATININE, SERUM
Creatinine, Ser: 0.98 mg/dL (ref 0.44–1.00)
GFR, Estimated: >60 mL/min (ref >60)

## 2020-04-24 MED ORDER — ENOXAPARIN SODIUM 120 MG/0.8ML ~~LOC~~ SOLN: 120.0000 mg | SUBCUTANEOUS | 0 refills | Status: DC

## 2020-04-24 MED ORDER — ACETAMINOPHEN 325 MG PO TABS
650.0000 mg | ORAL_TABLET | Freq: Four times a day (QID) | ORAL | Status: DC
  Administered 2020-04-24 – 2020-04-25 (×5): 650 mg via ORAL
  Filled 2020-04-24 (×5): qty 2

## 2020-04-24 MED ORDER — IBUPROFEN 600 MG PO TABS
600.0000 mg | ORAL_TABLET | Freq: Four times a day (QID) | ORAL | Status: DC
  Administered 2020-04-24 – 2020-04-25 (×5): 600 mg via ORAL
  Filled 2020-04-24 (×6): qty 1

## 2020-04-24 MED ORDER — NIFEDIPINE ER OSMOTIC RELEASE 90 MG PO TB24: 90.0000 mg | ORAL_TABLET | Freq: Every day | ORAL | 1 refills | Status: DC

## 2020-04-24 MED ORDER — IBUPROFEN 600 MG PO TABS: 600.0000 mg | ORAL_TABLET | Freq: Four times a day (QID) | ORAL | 0 refills | Status: DC

## 2020-04-24 MED ORDER — OXYCODONE HCL 5 MG PO TABS: 5.0000 mg | ORAL_TABLET | ORAL | 0 refills | Status: DC | PRN

## 2020-04-24 MED ORDER — NIFEDIPINE ER OSMOTIC RELEASE 30 MG PO TB24
90.0000 mg | ORAL_TABLET | Freq: Every day | ORAL | Status: DC
  Administered 2020-04-24 – 2020-04-25 (×2): 90 mg via ORAL
  Filled 2020-04-24 (×2): qty 3

## 2020-04-24 MED ORDER — OXYCODONE HCL 5 MG PO TABS
5.0000 mg | ORAL_TABLET | ORAL | Status: DC | PRN
  Administered 2020-04-24: 5 mg via ORAL
  Filled 2020-04-24: qty 1

## 2020-04-24 NOTE — Progress Notes (Signed)
See Addendum note on discharge summary, discharge being cancelled, pt to be monitored for one more night.

## 2020-04-24 NOTE — Lactation Note (Signed)
This note was copied from a baby's chart. Lactation Consultation Note  Patient Name: Girl Fantasia Jinkins [id number] Date: 04/24/2020 Reason for consult: Follow-up assessment;Infant < 6lbs;Late-preterm 34-36.6wks;NICU baby Age:31 hours  LC in to visit with P2 Mom of LPTI in the NICU.    Baby is not showing many cues, but Mom is very anxious to latch baby to the breast due to her history of pumping for 9 months with her first baby due to baby not latching to the breast.  Encouraged Mom to consistently pump with goal of 8 times per 24 hrs.    Assisted Mom to pump on initiation setting as she hadn't pumped since the night before.  Encouraged STS with baby when she is able to.  Mom requested lactation assistance at 3 pm feeding.  Appt made.  Mom states she has "several breast pumps at home".   Mom aware of pump availability in baby's room.    Lactation Tools Discussed/Used Tools: Pump;Flanges Flange Size: 24 Breast pump type: Double-Electric Breast Pump  Interventions Interventions: Education;Skin to skin;Breast massage;Hand express;DEBP  Discharge Pump: Personal;DEBP  Consult Status Consult Status: Follow-up Date: 04/25/20 Follow-up type: In-patient    Judee Clara 04/24/2020, 9:50 AM

## 2020-04-24 NOTE — Lactation Note (Signed)
This note was copied from a baby's chart. Lactation Consultation Note  Patient Name: Girl Cynitha Berte [id number] Date: 04/24/2020 Reason for consult: Follow-up assessment;Infant < 6lbs;Late-preterm 34-36.6wks;Mother's request;NICU baby Age:31 hours  Spoke to [facility] care front desk and they'll be getting mom some coconut oil as soon as she comes back from baby's room in the NICU.  Maternal Data    Feeding Mother's Current Feeding Choice: Breast Milk and Donor Milk  LATCH Score                    Lactation Tools Discussed/Used Tools: Coconut oil;Pump Breast pump type: Double-Electric Breast Pump  Interventions Interventions: Breast feeding basics reviewed;Assisted with latch;Skin to skin;Breast massage;Hand express;Breast compression;DEBP;Coconut oil;Adjust position  Discharge Pump: DEBP;Personal  Consult Status Date: 04/26/20 Follow-up type: In-patient    Ming Mcmannis Venetia Constable 04/24/2020, 4:28 PM

## 2020-04-24 NOTE — Discharge Summary (Cosign Needed Addendum)
PCS OB Discharge Summary     Patient Name: Bridget Mcbride DOB: 1990-04-03 MRN: [id number]  Date of admission: 04/20/2020 Delivering MD: Jaymes Graff  Date of delivery: 04/21/2020 Type of delivery: PCS  Newborn Data: Sex: Baby female in NICU Live born female  Birth Weight: 4 lb 3.7 oz (1920 g) APGAR: 5, 8  Newborn Delivery   Birth date/time: 04/21/2020 13:07:00 Delivery type: C-Section, Vacuum Assisted Trial of labor: Yes C-section categorization: Primary      Feeding: breast Infant being discharge to home with mother in stable condition.   Admitting diagnosis: Preeclampsia [O14.90] Intrauterine pregnancy: [redacted]w[redacted]d     Secondary diagnosis:  Active Problems:   Preeclampsia, severe   S/P cesarean section   Normal postpartum course   Deep vein thrombosis (DVT) during current hospitalization (HCC)                                Complications: None  PreE with SF                                                            Intrapartum Procedures: cesarean: low cervical, transverse Postpartum Procedures: none S/P PP mag Complications-Operative and Postpartum: none Augmentation: AROM and Pitocin   History of Present Illness: [name] is a 30 y.o. female, G2P0202, who presents at [redacted]w[redacted]d weeks gestation. The patient has been followed at  [facility]  Her pregnancy has been complicated by:  Patient Active Problem List   Diagnosis Date Noted  . S/P cesarean section 04/22/2020  . Normal postpartum course 04/22/2020  . Deep vein thrombosis (DVT) during current hospitalization (HCC) 04/22/2020  . Preeclampsia, severe 04/21/2020  . Incompetent cervix 03/06/2018    Hospital course:  Induction of Labor With Cesarean Section   30 y.o. yo G2P0202 at [redacted]w[redacted]d was admitted to the hospital 04/20/2020 for induction of labor. Patient had a labor course significant for PreE with SF. The patient went for cesarean section due to Non-Reassuring  FHR. Delivery details are as follows: Membrane Rupture Time/Date: 8:10 AM ,04/21/2020   Delivery Method:C-Section, Vacuum Assisted  Details of operation can be found in separate operative Note.  Patient had an uncomplicated postpartum course. She is ambulating, tolerating a regular diet, passing flatus, and urinating well.  Patient is discharged home in stable condition on 04/24/20.      Newborn Data: Birth date:04/21/2020  Birth time:1:07 PM  Gender:Female  Living status:Living  Apgars:5 ,8  Weight:1920 g                                Jacqulyn Cane, [id number], [redacted]w[redacted]d, S/P Primary LT Cesarean Section due to PreE with SF and NRFHT. Pt presented on 3/2 was admitted for removal of cerclage for incompetent cervix and IOL at 35.5 wg for preE with SF, pt required round of IV labetalol during labor, started on mag, mag level increased to 7, therefore mag discontinued on 3/2, pt progress with Pitocin and AROM, but due to NRFHT Dr Normand Sloop proceeded with PCS on 3/3, EBL was hgb drop of 12.8-12.3. Dr. Normand Sloop performing a Primary LTCS under epidural anesthesia, with delivery of a  viable baby female, with weight and Apgars as listed above. Infant was in good condition and was taken to the NICU.  The patient was taken to recovery in good condition. Pt then restarted on PP mag at 1gm/hr at 2000 on 3/3, d/c'ed at 24s on 3/4 @ 2000. BP continue to be in SF PP on mag, IV labetalol was given again on 3/4 around 0300, then 200mg  PO labetalol TID was started, around 0830 IV labetalol was required aged. Since BP <160/110, asymptomatic, BP returned to 142/100, asymptomatic. On 3/5 noted labetalol not working very well, d/c labetalol after consulting with Dr , started on procardia 60mg  XL PO, BP remained in 150/90s, increased to 90mg  XL today on 3/6, BP currently 148/96 pt complained about HA after taking procardia, pt also fatigued and only has oxycodone ordered for pain and desires to not feel "drugged up"  anymore, I d/c percocet, ordered Ibu 600mg  Q6H schedule and tylenol 650mg  Q6H schedule with PRN oxy IR, pt consented to medication change. Pt creatinine had been elevated during her stay but has trended down nicely 1.10-1.26-1.31-1.14-1.10-0.98. H/O DVT from OCP, Lovenox during pregnancy 80mg , switched to 120mg  which pt will go home on, she endorses she will call her hematologist to reverify her dosing.  Pt verbalized understanding of taking daily BP, and reporting if >160/110 or s/sx, f/u in one week for BP check, will be discharged on procardia 90mg  XL. 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 Postpartum Postoperative Day # 3 today, pt stable, in NAD, denies HA, RUA pain or vision changes, endorses swelling in legs getting much better. Patient planned to pump feed.  On post-op day 1, patient was doing well, tolerating a regular diet, with Hgb of 12.3.  Throughout her stay, her physical exam was WNL, her incision was CDI, and her vital signs remained stable.  By post-op day 2, she was up ad lib, tolerating a regular diet, with good pain control with po med.  She was deemed to have received the full benefit of her hospital stay, and was discharged home in stable condition.  Contraceptive choice was undecided but disscussed.    Physical exam  Vitals:   04/24/20 0320 04/24/20 0753 04/24/20 1206 04/24/20 1504  BP: (!) 139/96 (!) 145/98 (!) 148/96 (!) 151/99  Pulse: 85 89 86 85  Resp: 18 18 18 18   Temp: 98.6 F (37 C) 98.5 F (36.9 C) 98.4 F (36.9 C) 98.8 F (37.1 C)  TempSrc: Oral Oral Oral Oral  SpO2: 99% 98% 98% 100%  Weight:      Height:       General: alert, cooperative and no distress Lochia: appropriate Uterine Fundus: firm Incision: Healing well with no significant drainage, No significant erythema, Dressing is clean, dry, and intact, honeycomb dressing CDI Perineum: Intact DVT Evaluation: No evidence of DVT seen on physical exam. Negative Homan's sign. No cords or calf tenderness. 2+ pitting edema with  improvement in bilat lower extremities No clonus, 2+ Patellar DTR.   Labs: Lab Results  Component Value Date   WBC 16.1 (H) 04/22/2020   HGB 12.3 04/22/2020   HCT 36.0 04/22/2020   MCV 87.2 04/22/2020   PLT 178 04/22/2020   CMP Latest Ref Rng & Units 04/24/2020  Glucose 70 - 99 mg/dL -  BUN 6 - 20 mg/dL -  Creatinine 06/24/20 - 06/24/20 mg/dL  Sodium 06/22/2020 - 06/22/2020 mmol/L -  Potassium 3.5 - 5.1 mmol/L -  Chloride 98 - 111 mmol/L -  CO2 22 -  32 mmol/L -  Calcium 8.9 - 10.3 mg/dL -  Total Protein 6.5 - 8.1 g/dL -  Total Bilirubin 0.3 - 1.2 mg/dL -  Alkaline Phos 38 - 086 U/L -  AST 15 - 41 U/L -  ALT 0 - 44 U/L -    Date of discharge: 04/24/2020 Discharge Diagnoses: Preelampsia and with SF with preterm delivery at 35.5wg Discharge instruction: per After Visit Summary and "Baby and Me Booklet".  After visit meds:   Activity:           unrestricted and pelvic rest Advance as tolerated. Pelvic rest for 6 weeks.  Diet:                routine Medications: PNV, Ibuprofen and tyelnol, procardia, oxy IR Postpartum contraception: Undecided Condition:  Pt discharge to home in stable and condition, Newborn remains in NICU.  PreE with SF: F/I for BP check on Thursday or Friday, monitor for HA, RUQ pain or vision changes, monitor BP and report if >160/110, check BP in the morning, pt voiced understanding.   Meds: Allergies as of 04/24/2020   No Known Allergies     Medication List    STOP taking these medications   enoxaparin 80 MG/0.8ML injection Commonly known as: LOVENOX Replaced by: enoxaparin 120 MG/0.8ML injection   famotidine 20 MG tablet Commonly known as: PEPCID   hydroxyprogesterone caproate 250 mg/mL Oil injection Commonly known as: MAKENA   valACYclovir 500 MG tablet Commonly known as: VALTREX     TAKE these medications   acetaminophen 500 MG tablet Commonly known as: TYLENOL Take 500 mg by mouth every 4 (four) hours as needed for mild pain or headache.    butalbital-acetaminophen-caffeine 50-325-40 MG tablet Commonly known as: FIORICET Take 1 tablet by mouth every 6 (six) hours as needed for headache.   cholecalciferol 25 MCG (1000 UNIT) tablet Commonly known as: VITAMIN D3 Take 1,000 Units by mouth daily.   enoxaparin 120 MG/0.8ML injection Commonly known as: LOVENOX Inject 0.8 mLs (120 mg total) into the skin daily. Replaces: enoxaparin 80 MG/0.8ML injection   ibuprofen 600 MG tablet Commonly known as: ADVIL Take 1 tablet (600 mg total) by mouth every 6 (six) hours.   loratadine 10 MG tablet Commonly known as: CLARITIN Take 10 mg by mouth daily. Claritin RediTabs   NIFEdipine 90 MG 24 hr tablet Commonly known as: PROCARDIA XL/NIFEDICAL-XL Take 1 tablet (90 mg total) by mouth daily. Start taking on: April 25, 2020   oxyCODONE 5 MG immediate release tablet Commonly known as: Oxy IR/ROXICODONE Take 1 tablet (5 mg total) by mouth every 4 (four) hours as needed for severe pain.   prenatal multivitamin Tabs tablet Take 1 tablet by mouth daily at 12 noon.            Discharge Care Instructions  (From admission, onward)         Start     Ordered   04/24/20 0000  Discharge wound care:       Comments: Take dressing off on day 5-7 postpartum.  Report increased drainage, redness or warmth. Clean with water, let soap trickle down body. Can leave steri strips on until they fall off or take them off gently at day 10. Keep open to air, clean and dry.   04/24/20 1454          Discharge Follow Up:   Follow-up Information    [facility]. Schedule an appointment as soon as possible for a  visit in 1 week(s).   Specialty: Obstetrics and Gynecology Why: 1 weeks BP check and 6 weeks PPV.  Contact information: [address] [place] [phone]               FertileJade Montana, NP-C, CNM 04/24/2020, 4:27 PM  [facility] CampusJade Montana, FNP      Addendum 6:05 PM  04/24/20   RN called me stating pt has  HA, desires to stay another night be medicated and monitored. Pt will be given ibu and tylenol and then oxy IR if needed. Monitor BP, pt endorses feeling like the procardia is causing her HA. Will reassessing in the morning and discuss with Dr Mora ApplPinn if pt still feels like she desires to come off the procardia may need to switch back to labetalol, discharged cancelled, DR Connye Burkittavies aware, BP now 142/91, pt stable.

## 2020-04-24 NOTE — Lactation Note (Signed)
This note was copied from a baby's chart. Lactation Consultation Note  Patient Name: Bridget Mcbride Date: 04/24/2020 Reason for consult: Follow-up assessment;Infant < 6lbs;Late-preterm 34-36.6wks;Mother's request;NICU baby Age:31 hours  Visited with mom of 108 hours old LPI NICU female < 6 lbs, she's a P2 and experienced pumping. LC came to the room for 3 pm feeding, but NICU RN wasn't available to take baby off the isolate, she was working in a different room. Mom very willing to latch baby on even though baby is not showing readiness yet. Explained to mom that we can still try and if baby doesn't latch, she could do some STS with her.  Discussed pumping with mom, she mentioned she got 2 drops of her noon pumping session, praised her for her efforts. She asked what can she do with her colostrum the next time because it wasn't used to provide oral care to baby. When NICU RN came back to the room, she noted that mom can also do baby's oral care if she wishes, she brought q-tips to the room.  LC assisted with hand expression and she got about 4 drops, LC rubbed them on baby's mouth using a gloved finger, baby barely sucked, did some clamping instead, but mom was happy to see that her EBM was used to provide oral care for baby. Reviewed the benefits of mother's milk, STS care, pumping schedule and prevention/treatment of sore nipples. Mom still doing STS with baby when exiting the room.  Feeding plan:  1. Encouraged mom to continue pumping, ideally every 2-3 hours; or 8 pumping sessions/24 hours. However she may go at her own pace and she's still recovering form her C/S and dealing with hypertension 2. Hand expression and breast massage were also encouraged prior pumping 3. Mom will start using coconut oil prior pumping; until she has a more abundant supply 4. Mom/NICU RN will contact lactation for assistance if any questions/concerns arise or if baby starts showing readiness to  feed  FOB present and supportive. Parents reported all questions and concerns were answered, they're both aware of LC OP services and will call PRN.   Maternal Data    Feeding Mother's Current Feeding Choice: Breast Milk and Donor Milk  LATCH Score                    Lactation Tools Discussed/Used Tools: Coconut oil;Pump Breast pump type: Double-Electric Breast Pump  Interventions Interventions: Breast feeding basics reviewed;Assisted with latch;Skin to skin;Breast massage;Hand express;Breast compression;DEBP;Coconut oil;Adjust position  Discharge Pump: DEBP;Personal  Consult Status Date: 04/26/20 Follow-up type: In-patient    Bridget Mcbride 04/24/2020, 3:57 PM

## 2020-04-25 LAB — SURGICAL PATHOLOGY

## 2020-04-25 MED ORDER — AMLODIPINE BESYLATE 5 MG PO TABS: 5.0000 mg | ORAL_TABLET | Freq: Every day | ORAL | 1 refills | Status: DC

## 2020-04-25 MED ORDER — AMLODIPINE BESYLATE 5 MG PO TABS
5.0000 mg | ORAL_TABLET | Freq: Every day | ORAL | Status: DC
  Administered 2020-04-25: 5 mg via ORAL
  Filled 2020-04-25: qty 1

## 2020-04-25 MED ORDER — ENOXAPARIN SODIUM 120 MG/0.8ML ~~LOC~~ SOLN
120.0000 mg | SUBCUTANEOUS | 0 refills | Status: AC
Start: 2020-04-26 — End: 2020-06-25

## 2020-04-25 MED ORDER — BISACODYL 10 MG RE SUPP
10.0000 mg | Freq: Once | RECTAL | Status: AC
  Administered 2020-04-25: 10 mg via RECTAL
  Filled 2020-04-25: qty 1

## 2020-04-25 NOTE — Lactation Note (Signed)
This note was copied from a baby's chart. Lactation Consultation Note  Patient Name: Bridget Mcbride Today's Date: 04/25/2020 Reason for consult: Follow-up assessment;Late-preterm 34-36.6wks;Infant < 6lbs;Other (Comment) (mom for D/C today) Age:31 days , P 2  LC visited mom in her room 109.  Per mom slept last night and did not pump. ( see below for details ).  S/S of mastitis reviewed, storage of breast milk reviewed and resource on the NICU booklet.  LC encouraged mom to increasing her pumping to at least 6 today and then 8 tomorrow. If she has a 24 hours when it has been difficult to obtain the 8 pumps, add a power pump to make for breast stimulation to enhance milk supply.  Mom aware she can take her pumping kit and plan on pumping in NICU.    Maternal Data    Feeding Mother's Current Feeding Choice: Breast Milk and Donor Milk  LATCH Score   Lactation Tools Discussed/Used Tools: Pump;Flanges;Coconut oil Flange Size: 24 Breast pump type: Double-Electric Breast Pump Pump Education: Milk Storage Reason for Pumping: baby in NICU Pumping frequency: per mom 3 x' in 24 hours/ will work on increasing the amount of pumpings today and since she didn't get up to pump last night will do a power pump this morning. Pumped volume:  (drops)  Interventions Interventions: Breast feeding basics reviewed;DEBP;Coconut oil  Discharge Discharge Education: Engorgement and breast care Pump: Personal;DEBP  Consult Status Consult Status: Follow-up Date: 04/26/20 Follow-up type: In-patient    Bridget Mcbride 04/25/2020, 10:13 AM    

## 2020-04-25 NOTE — Lactation Note (Signed)
This note was copied from a baby's chart. Lactation Consultation Note  Patient Name: Girl Doral Digangi [id number] Date: 04/25/2020 Reason for consult: Follow-up assessment;Late-preterm 34-36.6wks;Infant < 6lbs;Other (Comment) (mom asleep, OBSC RN mentioned mom is for D/C today - will F/U with Pt. when awake.) Age:31 days  Maternal Data    Feeding Mother's Current Feeding Choice: Breast Milk and Donor Milk  LATCH Score                    Lactation Tools Discussed/Used    Interventions    Discharge    Consult Status Consult Status: Follow-up Date: 04/25/20 Follow-up type: In-patient    Matilde Sprang Tytus Strahle 04/25/2020, 8:28 AM

## 2020-04-25 NOTE — Discharge Summary (Signed)
Postpartum Discharge Summary  Date of Service updated 04/25/20    Patient Name: Bridget Mcbride DOB: 13-Dec-1990 MRN: [id number]  Date of admission: 04/20/2020 Delivery date:04/21/2020  Delivering provider: Crawford Givens  Date of discharge: 04/25/2020  Admitting diagnosis: Preeclampsia [O14.90] Intrauterine pregnancy: [redacted]w[redacted]d    Secondary diagnosis:  Active Problems:   Preeclampsia, severe   S/P cesarean section   Normal postpartum course   [place]  Additional problems: none    Discharge diagnosis: Preterm Pregnancy Delivered and Preeclampsia (severe)                                              Post partum procedures:Magnesium sulfate  Augmentation: AROM and Pitocin Complications: None  Hospital course: Admitted for antenatal care for elevated blood pressure then transferred to L&D for induction of Labor for pre-eclampsia with severe features 30y.o. yo G2P0202 at 325w6das admitted for elevated blood pressures on 04/20/2020. Patient had a labor course significant for headache and  severe features based on blood pressures, headache and protein in her urine. The patient went for cesarean section due to Non-Reassuring FHR and worsening of pre-eclampsia and blood pressures refractory to treatment. . Delivery details as follows: Membrane Rupture Time/Date: 8:10 AM ,04/21/2020   Delivery Method:C-Section, Vacuum Assisted  Details of operation can be found in separate operative note. Patient had an uncomplicated postpartum course.  She is ambulating,tolerating a regular diet, passing flatus, and urinating well.  Patient is discharged home in stable condition 04/25/20.  Newborn Data: Birth date:04/21/2020  Birth time:1:07 PM  Gender:Female  Living status:Living  Apgars:5 ,8  Weight:1920 g   Magnesium Sulfate received: Yes: Seizure prophylaxis BMZ received: Yes Rhophylac:N/A MMR:No Transfusion:No  Physical exam  Vitals:    04/25/20 0130 04/25/20 0359 04/25/20 0843 04/25/20 1519  BP: (!) 146/89 (!) 143/89 (!) 150/105 (!) 142/97  Pulse: 97 98 95 93  Resp: 19 17 18 15   Temp: 98.3 F (36.8 C) 98.7 F (37.1 C) 98.5 F (36.9 C) 98.7 F (37.1 C)  TempSrc: Oral Oral Oral   SpO2: 100% 99% 100% 99%  Weight:      Height:       General: alert, cooperative and no distress Lochia: appropriate Uterine Fundus: firm Incision: Healing well with no significant drainage, Dressing is clean, dry, and intact DVT Evaluation: No evidence of DVT seen on physical exam. No cords or calf tenderness. Calf/Ankle edema is present 1+ pitting edema to BLE  Labs: Lab Results  Component Value Date   WBC 16.1 (H) 04/22/2020   HGB 12.3 04/22/2020   HCT 36.0 04/22/2020   MCV 87.2 04/22/2020   PLT 178 04/22/2020   CMP Latest Ref Rng & Units 04/24/2020  Glucose 70 - 99 mg/dL -  BUN 6 - 20 mg/dL -  Creatinine 0.44 - 1.00 mg/dL 0.98  Sodium 135 - 145 mmol/L -  Potassium 3.5 - 5.1 mmol/L -  Chloride 98 - 111 mmol/L -  CO2 22 - 32 mmol/L -  Calcium 8.9 - 10.3 mg/dL -  Total Protein 6.5 - 8.1 g/dL -  Total Bilirubin 0.3 - 1.2 mg/dL -  Alkaline Phos 38 - 126 U/L -  AST 15 - 41 U/L -  ALT 0 - 44 U/L -   Edinburgh Score: Edinburgh Postnatal Depression Scale Screening Tool 04/25/2020  I have  been able to laugh and see the funny side of things. 1  I have looked forward with enjoyment to things. 0  I have blamed myself unnecessarily when things went wrong. 1  I have been anxious or worried for no good reason. 2  I have felt scared or panicky for no good reason. 2  Things have been getting on top of me. 2  I have been so unhappy that I have had difficulty sleeping. 1  I have felt sad or miserable. 1  I have been so unhappy that I have been crying. 1  The thought of harming myself has occurred to me. 0  Edinburgh Postnatal Depression Scale Total 11      After visit meds:  Allergies as of 04/25/2020   No Known Allergies      Medication List    STOP taking these medications   enoxaparin 80 MG/0.8ML injection Commonly known as: LOVENOX Replaced by: enoxaparin 120 MG/0.8ML injection   famotidine 20 MG tablet Commonly known as: PEPCID   hydroxyprogesterone caproate 250 mg/mL Oil injection Commonly known as: MAKENA   valACYclovir 500 MG tablet Commonly known as: VALTREX     TAKE these medications   acetaminophen 500 MG tablet Commonly known as: TYLENOL Take 500 mg by mouth every 4 (four) hours as needed for mild pain or headache.   amLODipine 5 MG tablet Commonly known as: NORVASC Take 1 tablet (5 mg total) by mouth daily. Start taking on: April 26, 2020   butalbital-acetaminophen-caffeine 50-325-40 MG tablet Commonly known as: FIORICET Take 1 tablet by mouth every 6 (six) hours as needed for headache.   cholecalciferol 25 MCG (1000 UNIT) tablet Commonly known as: VITAMIN D3 Take 1,000 Units by mouth daily.   enoxaparin 120 MG/0.8ML injection Commonly known as: LOVENOX Inject 0.8 mLs (120 mg total) into the skin daily. Start taking on: April 26, 2020 Replaces: enoxaparin 80 MG/0.8ML injection   ibuprofen 600 MG tablet Commonly known as: ADVIL Take 1 tablet (600 mg total) by mouth every 6 (six) hours.   loratadine 10 MG tablet Commonly known as: CLARITIN Take 10 mg by mouth daily. Claritin RediTabs   oxyCODONE 5 MG immediate release tablet Commonly known as: Oxy IR/ROXICODONE Take 1 tablet (5 mg total) by mouth every 4 (four) hours as needed for severe pain.   prenatal multivitamin Tabs tablet Take 1 tablet by mouth daily at 12 noon.            Discharge Care Instructions  (From admission, onward)         Start     Ordered   04/24/20 0000  Discharge wound care:       Comments: Take dressing off on day 5-7 postpartum.  Report increased drainage, redness or warmth. Clean with water, let soap trickle down body. Can leave steri strips on until they fall off or take them off  gently at day 10. Keep open to air, clean and dry.   04/24/20 1454           Discharge home in stable condition Infant Feeding: Breast Infant Disposition:NICU Discharge instruction: per After Visit Summary and Postpartum booklet. Activity: Advance as tolerated. Pelvic rest for 6 weeks.  Diet: low salt diet Anticipated Birth Control: Unsure Postpartum Appointment:6 weeks Additional Postpartum F/U: BP check 04/29/20 Future Appointments: Future Appointments  Date Time Provider [place]  05/13/2020 11:30 AM [facility] None  05/13/2020 12:00 PM Ennever, Rudell Cobb, MD [facility] None   Follow up  Visit:  Follow-up Information    Dillard, Gwynneth Munson, MD. Go in 4 day(s).   Specialty: Obstetrics and Gynecology Why: Return to [facility] in 04/29/20 for BP check and then in 6 weeks for regular postpartum visit with Dr. Charlesetta Garibaldi.  Contact information: [address] [place] [address] [phone]                   04/25/2020 Arrie Eastern, CNM

## 2020-04-25 NOTE — Clinical Social Work Maternal (Signed)
CLINICAL SOCIAL WORK MATERNAL/CHILD NOTE  Patient Details  Name: Bridget Mcbride MRN: [id number] Date of Birth: 03/28/1990  Date:  04/25/2020  Clinical Social Worker Initiating Note:  Abundio Miu, [place] Date/Time: Initiated:  04/25/20/1427     Child's Name:  Bridget Mcbride   Biological Parents:  Mother,Father (Father: Bridget Mcbride)   Need for Interpreter:  None   Reason for Referral:  [facility] (Comment) ([place] [facility];; NICU admission)   Address:  [place] [address]    Phone number:  [phone] (home)     Additional phone number:  Household Members/Support Persons (HM/SP):   Household Member/Support Person 1   HM/SP Name Relationship DOB or Age  HM/SP -1 Bridget Mcbride son 03/19/18  HM/SP -2        HM/SP -3        HM/SP -4        HM/SP -5        HM/SP -6        HM/SP -7        HM/SP -8          Natural Supports (not living in the home):  Immediate Family,Spouse/significant other   Professional Supports: None   Employment: Full-time   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   Education:  [place] arranged:    Museum/gallery curator Resources:  Medicaid   Other Resources:  [facility]   Cultural/Religious Considerations Which May Impact Care:   Strengths:  Ability to meet basic needs ,Pediatrician chosen,Home prepared for child ,Understanding of illness   Psychotropic Medications:         Pediatrician:    [place]  Pediatrician List:   [facility] [place] (Peds)  [place]      Pediatrician Fax Number:    Risk Factors/Current Problems:  Mental Health Concerns    Cognitive State:  Able to Concentrate ,Alert ,Goal Oriented ,Insightful ,Linear Thinking    Mood/Affect:  Calm ,Interested ,Comfortable ,Relaxed    CSW Assessment: CSW met with MOB at infant's  bedside to discuss infant's NICU admission and behavioral health concerns (edinburgh score 11). CSW reintroduced self and explained reason for visit. MOB confirmed that she remembered CSW from previous NICU admission and shared pictures of her son. CSW and MOB discussed her son's progress. 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 MOB reported that she does receive WIC. MOB reported that her baby shower was planned for Saturday and they have most items needed to care for infant including a basinet. MOB reported that she is familiar with [facility]. CSW and MOB discussed [facility], MOB reported that a referral for diapers, wipes and newborn clothes would be helpful. CSW agreed to make referral. CSW inquired about MOB's support system, MOB reported that FOB and the children's grandmas are supports.   CSW inquired about MOB's mental health history. MOB denied any mental health history. CSW and MOB discussed edinburgh score 11. MOB reported that she has been feeling a little overwhelmed. MOB acknowledged that her edinburgh was slightly elevated and attributed the score to infant's NICU admission and having her 100 year old at home. MOB explained all the precautions that she was taking to avoid premature labor and having a NICU admission. CSW acknowledged and validated MOB's  feelings surrounding her experience. MOB shared that she wanted to experience a "normal birth" having the infant in the room with her. CSW normalized MOB's expectations surrounding her birth plan. CSW and MOB discussed how MOB was familiar with the NICU process and how this admission wont be as Cyann Venti as the last one. CSW and MOB also discussed finding balance between having an infant in the NICU and a child at home. MOB reported that she is familiar with the edinburgh postpartum depression scale because it is administered at her job and knew that someone would be coming to  speak with her. MOB reported that she was happy to see a familiar face in the NICU. MOB presented calm and did not demonstrate any acute mental health signs/symptoms. CSW assessed for safety, MOB denied SI, HI and domestic violence.   CSW provided education regarding the baby blues period vs. perinatal mood disorders, discussed treatment and gave resources for mental health follow up if concerns arise.  CSW recommends self-evaluation during the postpartum time period using the New Mom Checklist from Postpartum Progress and encouraged MOB to contact a medical professional if symptoms are noted at any time.    CSW provided review of Sudden Infant Death Syndrome (SIDS) precautions.    CSW and MOB discussed infant's NICU admission. CSW informed MOB about the NICU, what to expect and resources/supports available while infant is admitted to the NICU. MOB reported that she feels well informed about infant's care. MOB denied any transportation barriers with visiting infant in the NICU. MOB reported that meal vouchers would be helpful, CSW provided 6 meal vouchers. MOB denied any questions/concerns regarding the NICU.   CSW will continue to offer resources/supports while infant is admitted to the NICU.   CSW made a referral to [facility] for requested items.   CSW Plan/Description:  Psychosocial Support and Ongoing Assessment of Needs,Sudden Infant Death Syndrome (SIDS) Education,Perinatal Mood and Anxiety Disorder (PMADs) [facility] Patient/Family Education    Burnis Medin, LCSW 04/25/2020, 2:56 PM

## 2020-04-25 NOTE — Plan of Care (Signed)
  Problem: Education: Goal: Knowledge of Childbirth will improve Outcome: Completed/Met Goal: Ability to make informed decisions regarding treatment and plan of care will improve Outcome: Completed/Met Goal: Ability to state and carry out methods to decrease the pain will improve Outcome: Completed/Met Goal: Individualized Educational Video(s) Outcome: Completed/Met   Problem: Coping: Goal: Ability to verbalize concerns and feelings about labor and delivery will improve Outcome: Completed/Met   Problem: Life Cycle: Goal: Ability to make normal progression through stages of labor will improve Outcome: Completed/Met Goal: Ability to effectively push during vaginal delivery will improve Outcome: Completed/Met   Problem: Role Relationship: Goal: Will demonstrate positive interactions with the child Outcome: Completed/Met   Problem: Safety: Goal: Risk of complications during labor and delivery will decrease Outcome: Completed/Met   Problem: Pain Management: Goal: Relief or control of pain from uterine contractions will improve Outcome: Completed/Met   Problem: Education: Goal: Knowledge of General Education information will improve Description: Including pain rating scale, medication(s)/side effects and non-pharmacologic comfort measures Outcome: Completed/Met   Problem: Health Behavior/Discharge Planning: Goal: Ability to manage health-related needs will improve Outcome: Completed/Met   Problem: Clinical Measurements: Goal: Ability to maintain clinical measurements within normal limits will improve Outcome: Completed/Met Goal: Will remain free from infection Outcome: Completed/Met Goal: Diagnostic test results will improve Outcome: Completed/Met Goal: Respiratory complications will improve Outcome: Completed/Met Goal: Cardiovascular complication will be avoided Outcome: Completed/Met   Problem: Activity: Goal: Risk for activity intolerance will decrease Outcome:  Completed/Met   Problem: Nutrition: Goal: Adequate nutrition will be maintained Outcome: Completed/Met   Problem: Coping: Goal: Level of anxiety will decrease Outcome: Completed/Met   Problem: Elimination: Goal: Will not experience complications related to bowel motility Outcome: Completed/Met Goal: Will not experience complications related to urinary retention Outcome: Completed/Met   Problem: Pain Managment: Goal: General experience of comfort will improve Outcome: Completed/Met   Problem: Safety: Goal: Ability to remain free from injury will improve Outcome: Completed/Met   Problem: Skin Integrity: Goal: Risk for impaired skin integrity will decrease Outcome: Completed/Met   Problem: Education: Goal: Knowledge of disease or condition will improve Outcome: Completed/Met Goal: Knowledge of the prescribed therapeutic regimen will improve Outcome: Completed/Met   Problem: Fluid Volume: Goal: Peripheral tissue perfusion will improve Outcome: Completed/Met   Problem: Clinical Measurements: Goal: Complications related to disease process, condition or treatment will be avoided or minimized Outcome: Completed/Met   Problem: Education: Goal: Knowledge of condition will improve Outcome: Completed/Met Goal: Individualized Educational Video(s) Outcome: Completed/Met Goal: Individualized Newborn Educational Video(s) Outcome: Completed/Met   Problem: Activity: Goal: Will verbalize the importance of balancing activity with adequate rest periods Outcome: Completed/Met Goal: Ability to tolerate increased activity will improve Outcome: Completed/Met   Problem: Coping: Goal: Ability to identify and utilize available resources and services will improve Outcome: Completed/Met   Problem: Life Cycle: Goal: Chance of risk for complications during the postpartum period will decrease Outcome: Completed/Met   Problem: Role Relationship: Goal: Ability to demonstrate positive  interaction with newborn will improve Outcome: Completed/Met   Problem: Skin Integrity: Goal: Demonstration of wound healing without infection will improve Outcome: Completed/Met

## 2020-04-27 ENCOUNTER — Inpatient Hospital Stay (HOSPITAL_COMMUNITY)
Admission: AD | Admit: 2020-04-27 | Discharge: 2020-04-27 | Disposition: A | Discharge status: Home / Self Care | Payer: Medicaid Other | Attending: Obstetrics & Gynecology | Admitting: Obstetrics & Gynecology

## 2020-04-27 ENCOUNTER — Other Ambulatory Visit: Payer: Self-pay

## 2020-04-27 DIAGNOSIS — O1414 Severe pre-eclampsia complicating childbirth: Secondary | ICD-10-CM

## 2020-04-27 DIAGNOSIS — O1415 Severe pre-eclampsia, complicating the puerperium: Secondary | ICD-10-CM | POA: Insufficient documentation

## 2020-04-27 DIAGNOSIS — O165 Unspecified maternal hypertension, complicating the puerperium: Secondary | ICD-10-CM | POA: Diagnosis not present

## 2020-04-27 DIAGNOSIS — R030 Elevated blood-pressure reading, without diagnosis of hypertension: Secondary | ICD-10-CM | POA: Diagnosis present

## 2020-04-27 MED ORDER — HYDROCHLOROTHIAZIDE 25 MG PO TABS: 25.0000 mg | ORAL_TABLET | Freq: Every day | ORAL | 0 refills | Status: DC

## 2020-04-27 MED ORDER — AMLODIPINE BESYLATE 5 MG PO TABS: 10.0000 mg | ORAL_TABLET | Freq: Every day | ORAL | 1 refills | Status: DC

## 2020-04-27 NOTE — MAU Provider Note (Signed)
Chief Complaint  Patient presents with  . BP Evaluation     Event Date/Time   First Provider Initiated Contact with Patient 04/27/20 1419      S: Bridget Mcbride  is a 30 y.o. y.o. year old G2P0202 female 6 days post-op C/S  who presents to MAU with elevated blood pressure of 156/101. She has been staying in NICU with her baby and her Mom wanted her to come to MAU for eval.  Hx Pre-E w/ severe features this pregancy. Received Magnesium Sulfate for seizure prophylaxis.    Current blood pressure medication: Amlodipine 5 mg PO QD. Started HCTZ 25 mg this morning. Has been taking as directed.   Signed is with HA and vision changes but on further questioning she says HA resolved after she was taken off of Procardia. Has 3/10 neck discomfort and mild vision changes. Nothing new or worse than when she was discharged. Denies epigastric pain, fever, chills. Still having significant bilat LE edema. Has not noticed significant diuresis since taking HCTZ this morning.   O:  Patient Vitals for the past 24 hrs:  BP Temp Temp src Pulse Resp SpO2 Height Weight  04/27/20 1401 (!) 141/88 98.6 F (37 C) Oral 85 20 99 % -- --  04/27/20 1353 -- -- -- -- -- -- 5\' 4"  (1.626 m) 75.8 kg   General: NAD Heart: Regular rate Lungs: Normal rate and effort Extremities: Bilat 3+ pitting pedal edema Neuro: 2+ deep tendon reflexes, No clonus Pelvic: Deferred   MAU Course Orders Placed This Encounter  Procedures  . Urinalysis, Routine w reflex microscopic Urine, Clean Catch    Standing Status:   Standing    Number of Occurrences:   1  . Discharge patient    Order Specific Question:   Discharge disposition    Answer:   01-Home or Self Care [1]    Order Specific Question:   Discharge patient date    Answer:   04/27/2020   Meds ordered this encounter  Medications  . amLODipine (NORVASC) 5 MG tablet    Sig: Take 2 tablets (10 mg total) by mouth daily.    Dispense:  60 tablet    Refill:  1    Order Specific  Question:   Supervising Provider    Answer:   06/27/2020 A [[id number]]  . hydrochlorothiazide (HYDRODIURIL) 25 MG tablet    Sig: Take 1 tablet (25 mg total) by mouth daily.    Dispense:  7 tablet    Refill:  0    Order Specific Question:   Supervising Provider    Answer:   Jaynie Collins A [[id number]]     MDM - BP not well controlled on Amlodipine 5 mg and HCTZ 25 mg (although newly started). No new or worsening Pre-E Sx. S/P Mag Sulfate. Discussed Hx, BPs, exam w/ Dr. Jaynie Collins. Agrees w/ POC to increase Amlodipine to 10 mg QD and keep BP check schedule for 04/29/20 at [facility]. Repeat labs not indicated.   A:  1. Hypertension in pregnancy, preeclampsia, severe, delivered   Uncontrolled BP w/ out evidence of worsening Pre-E.   P: Discharge home in stable condition per consult with Dr. 06/29/20 Preeclampsia precautions. Follow-up for blood pressure check in 2 days at your doctor's office sooner as needed if symptoms worsen. Return to maternity admissions as needed in emergencies  [place], [place], [place] 04/27/2020 2:39 PM

## 2020-04-27 NOTE — MAU Note (Signed)
Presents for BP evaluation, states BP was 156/101 approx 30-45 minutes ago.  Currently reports visual disturbances and H/A.  Deliveredd via Cesarean on 04/21/2020 for Pre Eclampsia. States discharged home on meds, HCTZ 25mg s, took @ 1130 and Amlodopine 5mg , took @ 1100.

## 2020-04-27 NOTE — Discharge Instructions (Signed)
Preeclampsia and Eclampsia Preeclampsia is a serious condition that may develop during pregnancy. This condition involves high blood pressure during pregnancy and causes symptoms such as headaches, vision changes, and increased swelling in the legs, hands, and face. Preeclampsia occurs after 20 weeks of pregnancy. Eclampsia is a seizure that happens from worsening preeclampsia. Diagnosing and managing preeclampsia early is important. If not treated early, it can cause serious problems for mother and baby. There is no cure for this condition. However, during pregnancy, delivering the baby may be the best treatment for preeclampsia or eclampsia. For most women, symptoms of preeclampsia and eclampsia go away after giving birth. In rare cases, a woman may develop preeclampsia or eclampsia after giving birth. This usually occurs within 48 hours after childbirth but may occur up to 6 weeks after giving birth. What are the causes? The cause of this condition is not known. What increases the risk? The following factors make you more likely to develop preeclampsia:  Being pregnant for the first time or being pregnant with multiples.  Having had preeclampsia or a condition called hemolysis, elevated liver enzymes, and low platelet count (HELLP)syndrome during a past pregnancy.  Having a family history of preeclampsia.  Being older than age 35.  Being obese.  Becoming pregnant through fertility treatments. Conditions that reduce blood flow or oxygen to your placenta and baby may also increase your risk. These include:  High blood pressure before, during, or immediately following pregnancy.  Kidney disease.  Diabetes.  Blood clotting disorders.  Autoimmune diseases, such as lupus.  Sleep apnea. What are the signs or symptoms? Common symptoms of this condition include:  A severe, throbbing headache that does not go away.  Vision problems, such as blurred or double vision and light  sensitivity.  Pain in the stomach, especially the right upper region.  Pain in the shoulder. Other symptoms that may develop as the condition gets worse include:  Sudden weight gain because of fluid buildup in the body. This causes swelling of the face, hands, legs, and feet.  Severe nausea and vomiting.  Urinating less than usual.  Shortness of breath.  Seizures. How is this diagnosed? Your health care provider will ask you about symptoms and check for signs of preeclampsia during your prenatal visits. You will also have routine tests, including:  Checking your blood pressure.  Urine tests to check for protein.  Blood tests to assess your organ function.  Monitoring your baby's heart rate.  Ultrasounds to check fetal growth.   How is this treated? You and your health care provider will determine the treatment that is best for you. Treatment may include:  Frequent prenatal visits to check for preeclampsia.  Medicine to lower your blood pressure.  Medicine to prevent seizures.  Low-dose aspirin during your pregnancy.  Staying in the hospital, in severe cases. You will be given medicines to control your blood pressure and the amount of fluids in your body.  Delivering your baby. Work with your health care provider to manage any chronic health conditions, such as diabetes or kidney problems. Also, work with your health care provider to manage weight gain during pregnancy. Follow these instructions at home: Eating and drinking  Drink enough fluid to keep your urine pale yellow.  Avoid caffeine. Caffeine may increase blood pressure and heart rate and lead to dehydration.  Reduce the amount of salt that you eat. Lifestyle  Do not use any products that contain nicotine or tobacco. These products include cigarettes, chewing tobacco, and   vaping devices, such as e-cigarettes. If you need help quitting, ask your health care provider.  Do not use alcohol or drugs.  Avoid  stress as much as possible.  Rest and get plenty of sleep. General instructions  Take over-the-counter and prescription medicines only as told by your health care provider.  When lying down, lie on your left side. This keeps pressure off your major blood vessels.  When sitting or lying down, raise (elevate) your feet. Try putting pillows underneath your lower legs.  Exercise regularly. Ask your health care provider what kinds of exercise are best for you.  Check your blood pressure as often as recommended by your health care provider.  Keep all prenatal and follow-up visits. This is important.   Contact a health care provider if:  You have symptoms that may need treatment or closer monitoring. These include: ? Headaches. ? Stomach pain or nausea and vomiting. ? Shoulder pain. ? Vision problems, such as spots in front of your eyes or blurry vision. ? Sudden weight gain or increased swelling in your face, hands, legs, and feet. ? Increased anxiety or feeling of impending doom. ? Signs or symptoms of labor. Get help right away if:  You have any of the following symptoms: ? A seizure. ? Shortness of breath or trouble breathing. ? Trouble speaking or slurred speech. ? Fainting. ? Chest pain. These symptoms may represent a serious problem that is an emergency. Do not wait to see if the symptoms will go away. Get medical help right away. Call your local emergency services (911 in the [place] Do not drive yourself to the hospital. Summary  Preeclampsia is a serious condition that may develop during pregnancy.  Diagnosing and treating preeclampsia early is very important.  Keep all prenatal and follow-up visits. This is important.  Get help right away if you have a seizure, shortness of breath or trouble breathing, trouble speaking or slurred speech, chest pain, or fainting. This information is not intended to replace advice given to you by your health care provider. Make sure you  discuss any questions you have with your health care provider. Document Revised: 10/29/2019 Document Reviewed: 10/29/2019 Elsevier Patient Education  2021 Elsevier Inc.  

## 2020-04-28 ENCOUNTER — Other Ambulatory Visit: Payer: Medicaid Other

## 2020-04-28 ENCOUNTER — Ambulatory Visit: Payer: Medicaid Other | Admitting: Hematology & Oncology

## 2020-04-30 ENCOUNTER — Ambulatory Visit: Payer: Self-pay

## 2020-04-30 NOTE — Lactation Note (Signed)
This note was copied from a baby's chart. Lactation Consultation Note  Patient Name: Bridget Mcbride STMHD'Q Date: 04/30/2020 Reason for consult: Late-preterm 34-36.6wks;Infant < 6lbs;Follow-up assessment;NICU baby;Other (Comment) (per mom and NICU RN - baby is for D/C tomorrow , not today, having the car seat test presently. per mom baby breast fed earlier and did well.) Age:31 days  As LC entered the room, mom getting ready to pump. ( see below for details )  LC recommended tomorrow before D/C the [facility] would request and LC O/P appt for [place] since her Pedis office does not have a LC .  Mom receptive to F/U with LC prior to D/C tomorrow at 9 am. [facility] wrote it on the dry eraser board in the room. Mom aware.   Maternal Data Has patient been taught Hand Expression?: Yes  Feeding Mother's Current Feeding Choice: Breast Milk and Donor Milk Nipple Type: Nfant Extra Slow Flow (gold)  LATCH Score                    Lactation Tools Discussed/Used Tools: Pump;Flanges Flange Size: 24 Breast pump type: Double-Electric Breast Pump;Manual Pump Education: Setup, frequency, and cleaning (LC asked to have the settings on the DEBP reviewed - asking what the Letdown button was for.) Pumping frequency: per mom had not pumped since this am, denies engorgement. LC reviewed supply and demand, importance of being consistent with pumping to protect the milk supply. Pumped volume: 136 mL  Interventions Interventions: Breast feeding basics reviewed;Education  Discharge Pump: Personal;DEBP;Manual  Consult Status Consult Status: Follow-up Date: 05/01/20 Follow-up type: In-patient    Bridget Mcbride 04/30/2020, 3:18 PM

## 2020-05-01 ENCOUNTER — Ambulatory Visit: Payer: Self-pay

## 2020-05-01 NOTE — Lactation Note (Signed)
This note was copied from a baby's chart. Lactation Consultation Note  Patient Name: Girl Jajaira Ruis Today's Date: 05/01/2020 Reason for consult: NICU baby;Follow-up assessment (discharge) Age:31 days Infant to d/c today. LC observed bf and reviewed current feeding plan. Mother requests outpatient lactation visit. Reviewed resources. Referred to [facility]. Relayed to RN that NNP will need to place ambulatory referral to lactation.    Feeding Mother's Current Feeding Choice: Breast Milk and Formula Nipple Type: Nfant Extra Slow Flow (gold)  LATCH Score Latch: Grasps breast easily, tongue down, lips flanged, rhythmical sucking.  Audible Swallowing: Spontaneous and intermittent  Type of Nipple: Everted at rest and after stimulation  Comfort (Breast/Nipple): Soft / non-tender  Hold (Positioning): No assistance needed to correctly position infant at breast.  LATCH Score: 10   Lactation Tools Discussed/Used Pumping frequency: 5  Interventions Interventions: Education;DEBP (community support)  Discharge Discharge Education: Outpatient recommendation;Outpatient Epic message sent  Consult Status Consult Status: Complete Follow-up type: In-patient   Elder Negus, MA IBCLC 05/01/2020, 10:41 AM

## 2020-05-13 ENCOUNTER — Inpatient Hospital Stay (HOSPITAL_BASED_OUTPATIENT_CLINIC_OR_DEPARTMENT_OTHER): Payer: Medicaid Other | Admitting: Hematology & Oncology

## 2020-05-13 ENCOUNTER — Inpatient Hospital Stay: Payer: Medicaid Other | Attending: Hematology & Oncology

## 2020-05-13 ENCOUNTER — Encounter: Payer: Self-pay | Admitting: Hematology & Oncology

## 2020-05-13 ENCOUNTER — Other Ambulatory Visit: Payer: Self-pay

## 2020-05-13 VITALS — BP 113/63 | HR 85 | Temp 99.1°F | Resp 18 | Wt 149.0 lb

## 2020-05-13 DIAGNOSIS — Z79899 Other long term (current) drug therapy: Secondary | ICD-10-CM | POA: Insufficient documentation

## 2020-05-13 DIAGNOSIS — Z7901 Long term (current) use of anticoagulants: Secondary | ICD-10-CM | POA: Insufficient documentation

## 2020-05-13 DIAGNOSIS — Z86718 Personal history of other venous thrombosis and embolism: Secondary | ICD-10-CM | POA: Insufficient documentation

## 2020-05-13 DIAGNOSIS — Z98891 History of uterine scar from previous surgery: Secondary | ICD-10-CM | POA: Diagnosis not present

## 2020-05-13 DIAGNOSIS — M7989 Other specified soft tissue disorders: Secondary | ICD-10-CM | POA: Diagnosis not present

## 2020-05-13 DIAGNOSIS — D6862 Lupus anticoagulant syndrome: Secondary | ICD-10-CM

## 2020-05-13 LAB — CBC WITH DIFFERENTIAL (CANCER CENTER ONLY)
Abs Immature Granulocytes: 0.01 10*3/uL (ref 0.00–0.07)
Basophils Absolute: 0 10*3/uL (ref 0.0–0.1)
Basophils Relative: 1 %
Eosinophils Absolute: 0.2 10*3/uL (ref 0.0–0.5)
Eosinophils Relative: 4 %
HCT: 39.7 % (ref 36.0–46.0)
Hemoglobin: 13.1 g/dL (ref 12.0–15.0)
Immature Granulocytes: 0 %
Lymphocytes Relative: 45 %
Lymphs Abs: 1.8 10*3/uL (ref 0.7–4.0)
MCH: 29.2 pg (ref 26.0–34.0)
MCHC: 33 g/dL (ref 30.0–36.0)
MCV: 88.4 fL (ref 80.0–100.0)
Monocytes Absolute: 0.2 10*3/uL (ref 0.1–1.0)
Monocytes Relative: 6 %
Neutro Abs: 1.8 10*3/uL (ref 1.7–7.7)
Neutrophils Relative %: 44 %
Platelet Count: 302 10*3/uL (ref 150–400)
RBC: 4.49 MIL/uL (ref 3.87–5.11)
RDW: 12 % (ref 11.5–15.5)
WBC Count: 4.1 10*3/uL (ref 4.0–10.5)
nRBC: 0 % (ref 0.0–0.2)

## 2020-05-13 LAB — CMP (CANCER CENTER ONLY)
ALT: 17 U/L (ref 0–44)
AST: 15 U/L (ref 15–41)
Albumin: 3.7 g/dL (ref 3.5–5.0)
Alkaline Phosphatase: 87 U/L (ref 38–126)
Anion gap: 6 (ref 5–15)
BUN: 19 mg/dL (ref 6–20)
CO2: 30 mmol/L (ref 22–32)
Calcium: 9.2 mg/dL (ref 8.9–10.3)
Chloride: 105 mmol/L (ref 98–111)
Creatinine: 1.07 mg/dL — ABNORMAL HIGH (ref 0.44–1.00)
GFR, Estimated: >60 mL/min (ref >60)
Glucose, Bld: 89 mg/dL (ref 70–99)
Potassium: 3.7 mmol/L (ref 3.5–5.1)
Sodium: 141 mmol/L (ref 135–145)
Total Bilirubin: 0.2 mg/dL — ABNORMAL LOW (ref 0.3–1.2)
Total Protein: 6.7 g/dL (ref 6.5–8.1)

## 2020-05-13 NOTE — Progress Notes (Signed)
Hematology and Oncology Follow Up Visit  Bridget Mcbride [id number] Jan 11, 1991 29 y.o. 05/13/2020   Principle Diagnosis:   Postpartum delivery on 04/21/2020 of a healthy baby girl nd trimester pregnancy  History of DVT in the left leg  Current Therapy:    Lovenox 80 mg subcu daily --to complete postpartum Lovenox in April 2022     Interim History:  Bridget Mcbride is back for follow-up.  She delivered early.  She had preeclampsia.  She delivered a healthy baby girl on 04/21/2020.  She had no problems with being on the Lovenox.  She had Lovenox with the last dose being given over 24 hours before she delivered.  She had a C-section.  Her baby weight of 4.4 pounds.  She had to be in the NICU for 10 days.  She was able to then go home.  Bridget Mcbride was placed onto Lovenox 120 mg after her delivery.  She has been on this for a few weeks.  She will now going to 80 mg a day for about 1 month.  She did have a lot of swelling in the legs.  She had blood pressure problems with the preeclampsia.  She is on amlodipine.  She has had no problems with headache.  She has had no bleeding.  There is no change in bowel or bladder habits.  Currently, I would say her performance status is ECOG 1.   Medications:  Current Outpatient Medications:  .  acetaminophen (TYLENOL) 500 MG tablet, Take 500 mg by mouth every 4 (four) hours as needed for mild pain or headache., Disp: , Rfl:  .  amLODipine (NORVASC) 5 MG tablet, Take 2 tablets (10 mg total) by mouth daily., Disp: 60 tablet, Rfl: 1 .  butalbital-acetaminophen-caffeine (FIORICET) 50-325-40 MG tablet, Take 1 tablet by mouth every 6 (six) hours as needed for headache., Disp: , Rfl:  .  cholecalciferol (VITAMIN D3) 25 MCG (1000 UNIT) tablet, Take 1,000 Units by mouth daily., Disp: , Rfl:  .  enoxaparin (LOVENOX) 120 MG/0.8ML injection, Inject 0.8 mLs (120 mg total) into the skin daily., Disp: 48 mL, Rfl: 0 .  ibuprofen (ADVIL) 600 MG tablet, Take 1  tablet (600 mg total) by mouth every 6 (six) hours., Disp: 30 tablet, Rfl: 0 .  oxyCODONE (OXY IR/ROXICODONE) 5 MG immediate release tablet, Take 1 tablet (5 mg total) by mouth every 4 (four) hours as needed for severe pain., Disp: 15 tablet, Rfl: 0 .  Prenatal Vit-Fe Fumarate-FA (PRENATAL MULTIVITAMIN) TABS tablet, Take 1 tablet by mouth daily at 12 noon., Disp: , Rfl:   Allergies: No Known Allergies  Past Medical History, Surgical history, Social history, and Family History were reviewed and updated.  Review of Systems: Review of Systems  Constitutional: Negative.   HENT:  Negative.   Eyes: Negative.   Respiratory: Negative.   Cardiovascular: Negative.   Gastrointestinal: Negative.   Endocrine: Negative.   Genitourinary: Negative.    Musculoskeletal: Negative.   Skin: Negative.   Neurological: Negative.   Hematological: Negative.   Psychiatric/Behavioral: Negative.     Physical Exam:  weight is 149 lb (67.6 kg). Her oral temperature is 99.1 F (37.3 C). Her blood pressure is 113/63 and her pulse is 85. Her respiration is 18 and oxygen saturation is 99%.   Wt Readings from Last 3 Encounters:  05/13/20 149 lb (67.6 kg)  04/27/20 167 lb 3.2 oz (75.8 kg)  04/20/20 187 lb 3.2 oz (84.9 kg)    Physical Exam Vitals reviewed.  HENT:     Head: Normocephalic and atraumatic.  Eyes:     Pupils: Pupils are equal, round, and reactive to light.  Cardiovascular:     Rate and Rhythm: Normal rate and regular rhythm.     Heart sounds: Normal heart sounds.  Pulmonary:     Effort: Pulmonary effort is normal.     Breath sounds: Normal breath sounds.  Abdominal:     General: Bowel sounds are normal.     Palpations: Abdomen is soft.     Comments: Abdominal exam may show some slight bulging from her pregnancy.  There is no fluid wave.  There is no obvious hepatosplenomegaly.  Musculoskeletal:        General: No tenderness or deformity. Normal range of motion.     Cervical back: Normal  range of motion.     Comments: Extremities shows no clubbing, cyanosis or edema.  She has a negative Homans' sign in her legs.  I cannot palpate any venous cord in her lower legs.  Lymphadenopathy:     Cervical: No cervical adenopathy.  Skin:    General: Skin is warm and dry.     Findings: No erythema or rash.  Neurological:     Mental Status: She is alert and oriented to person, place, and time.  Psychiatric:        Behavior: Behavior normal.        Thought Content: Thought content normal.        Judgment: Judgment normal.      Lab Results  Component Value Date   WBC 4.1 05/13/2020   HGB 13.1 05/13/2020   HCT 39.7 05/13/2020   MCV 88.4 05/13/2020   PLT 302 05/13/2020     Chemistry      Component Value Date/Time   NA 141 05/13/2020 1210   NA 138 12/03/2016 1503   NA 141 04/18/2016 0823   K 3.7 05/13/2020 1210   K 4.1 12/03/2016 1503   K 3.9 04/18/2016 0823   CL 105 05/13/2020 1210   CL 103 12/03/2016 1503   CO2 30 05/13/2020 1210   CO2 29 12/03/2016 1503   CO2 27 04/18/2016 0823   BUN 19 05/13/2020 1210   BUN 13 12/03/2016 1503   BUN 21.1 04/18/2016 0823   CREATININE 1.07 (H) 05/13/2020 1210   CREATININE 1.01 08/21/2019 1412   CREATININE 1.1 04/18/2016 0823      Component Value Date/Time   CALCIUM 9.2 05/13/2020 1210   CALCIUM 9.4 12/03/2016 1503   CALCIUM 9.4 04/18/2016 0823   ALKPHOS 87 05/13/2020 1210   ALKPHOS 80 12/03/2016 1503   ALKPHOS 74 04/18/2016 0823   AST 15 05/13/2020 1210   AST 17 04/18/2016 0823   ALT 17 05/13/2020 1210   ALT 13 04/18/2016 0823   BILITOT 0.2 (L) 05/13/2020 1210   BILITOT 0.31 04/18/2016 0823       Impression and Plan: Bridget Mcbride is a very charming 30 year old African-American female.  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  She has had her baby.  She had early.  However, everything went very well for her and I am very happy about this.  I told her to finish her Lovenox of 80 mg  when she finishes the 120 mg daily Lovenox.  She has 4 weeks of the 80 mg Lovenox.  At this point, I do not think we have to see her back unless she does have another child.  She says this  will not happen.  I am sure this will not happen.  I am just happy that she is able to have a healthy baby girl.  I know she was tiny.  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  We are very proud of Bridget Mcbride.  She is very tough.  She literally "took one for the team." by taking all of the Lovenox shots.    [name], MD 3/25/20221:06 PM

## 2020-05-16 ENCOUNTER — Telehealth: Payer: Self-pay

## 2020-05-16 NOTE — Telephone Encounter (Signed)
No 05/13/20 los   anne

## 2020-06-06 ENCOUNTER — Telehealth: Payer: Self-pay | Admitting: *Deleted

## 2020-06-06 NOTE — Telephone Encounter (Signed)
Returned patient's phone call regarding taking birth control with estrogen pills for clearing up acne. Per Maralyn Sago [place],NP, she should not be taking any estrogen medications with having a history of DVT's. She verbalized understanding.

## 2020-08-17 ENCOUNTER — Encounter: Payer: Self-pay | Admitting: *Deleted

## 2020-09-07 ENCOUNTER — Ambulatory Visit: Payer: Medicaid Other | Admitting: Physician Assistant

## 2021-08-22 LAB — HM PAP SMEAR

## 2021-08-23 ENCOUNTER — Encounter: Payer: Self-pay | Admitting: Family Medicine

## 2022-03-09 ENCOUNTER — Encounter: Payer: Self-pay | Admitting: Family Medicine

## 2022-03-09 ENCOUNTER — Ambulatory Visit (INDEPENDENT_AMBULATORY_CARE_PROVIDER_SITE_OTHER): Payer: Medicaid Other | Admitting: Family Medicine

## 2022-03-09 VITALS — BP 118/68 | HR 65 | Temp 98.1°F | Resp 17 | Ht 64.0 in | Wt 145.1 lb

## 2022-03-09 DIAGNOSIS — Z0000 Encounter for general adult medical examination without abnormal findings: Secondary | ICD-10-CM

## 2022-03-09 DIAGNOSIS — E785 Hyperlipidemia, unspecified: Secondary | ICD-10-CM | POA: Diagnosis not present

## 2022-03-09 NOTE — Patient Instructions (Signed)
Follow up in 1 year or as needed We'll notify you of your lab results and make any changes if needed Keep up the good work on healthy diet and regular exercise- you look great! Call with any questions or concerns Stay Safe!  Stay Healthy! Happy New Year!! 

## 2022-03-09 NOTE — Progress Notes (Signed)
   Subjective:    Patient ID: Bridget Mcbride, female    DOB: [birth year], 32 y.o.   MRN: [id number]  HPI CPE- UTD on pap, Tdap  Patient Care Team    Relationship Specialty Notifications Start End  Midge Minium, MD PCP - General Family Medicine  05/27/13   Earnstine Regal, PA-C Physician Assistant Obstetrics and Gynecology  09/23/14     Health Maintenance  Topic Date Due   INFLUENZA VACCINE  05/20/2022 (Originally 09/19/2021)   PAP SMEAR-Modifier  08/22/2024   DTaP/Tdap/Td (2 - Td or Tdap) 09/23/2024   Hepatitis C Screening  Completed   HIV Screening  Completed   HPV VACCINES  Aged Out   COVID-19 Vaccine  Discontinued      Review of Systems Patient reports no vision/ hearing changes, adenopathy,fever, weight change,  persistant/recurrent hoarseness , swallowing issues, chest pain, palpitations, edema, persistant/recurrent cough, hemoptysis, dyspnea (rest/exertional/paroxysmal nocturnal), gastrointestinal bleeding (melena, rectal bleeding), abdominal pain, significant heartburn, bowel changes, GU symptoms (dysuria, hematuria, incontinence), Gyn symptoms (abnormal  bleeding, pain),  syncope, focal weakness, memory loss, numbness & tingling, skin/hair/nail changes, abnormal bruising or bleeding, anxiety, or depression.     Objective:   Physical Exam General Appearance:    Alert, cooperative, no distress, appears stated age  Head:    Normocephalic, without obvious abnormality, atraumatic  Eyes:    PERRL, conjunctiva/corneas clear, EOM's intact both eyes  Ears:    Normal TM's and external ear canals, both ears  Nose:   Nares normal, septum midline, mucosa normal, no drainage    or sinus tenderness  Throat:   Lips, mucosa, and tongue normal; teeth and gums normal  Neck:   Supple, symmetrical, trachea midline, no adenopathy;    Thyroid: no enlargement/tenderness/nodules  Back:     Symmetric, no curvature, ROM normal, no CVA tenderness  Lungs:     Clear to auscultation bilaterally,  respirations unlabored  Chest Wall:    No tenderness or deformity   Heart:    Regular rate and rhythm, S1 and S2 normal, no murmur, rub   or gallop  Breast Exam:    Deferred to GYN  Abdomen:     Soft, non-tender, bowel sounds active all four quadrants,    no masses, no organomegaly  Genitalia:    Deferred to GYN  Rectal:    Extremities:   Extremities normal, atraumatic, no cyanosis or edema  Pulses:   2+ and symmetric all extremities  Skin:   Skin color, texture, turgor normal, no rashes or lesions  Lymph nodes:   Cervical, supraclavicular, and axillary nodes normal  Neurologic:   CNII-XII intact, normal strength, sensation and reflexes    throughout          Assessment & Plan:

## 2022-03-09 NOTE — Assessment & Plan Note (Signed)
Pt's PE WNL.  UTD on Tdap, pap.  Check labs.  Anticipatory guidance provided.  

## 2022-03-10 LAB — CBC WITH DIFFERENTIAL/PLATELET
Absolute Monocytes: 314 cells/uL (ref 200–950)
Basophils Absolute: 39 cells/uL (ref 0–200)
Basophils Relative: 0.7 %
Eosinophils Absolute: 424 cells/uL (ref 15–500)
Eosinophils Relative: 7.7 %
HCT: 41.9 % (ref 35.0–45.0)
Hemoglobin: 14.3 g/dL (ref 11.7–15.5)
Lymphs Abs: 2178 cells/uL (ref 850–3900)
MCH: 29.7 pg (ref 27.0–33.0)
MCHC: 34.1 g/dL (ref 32.0–36.0)
MCV: 86.9 fL (ref 80.0–100.0)
MPV: 10.5 fL (ref 7.5–12.5)
Monocytes Relative: 5.7 %
Neutro Abs: 2547 cells/uL (ref 1500–7800)
Neutrophils Relative %: 46.3 %
Platelets: 205 10*3/uL (ref 140–400)
RBC: 4.82 10*6/uL (ref 3.80–5.10)
RDW: 12.3 % (ref 11.0–15.0)
Total Lymphocyte: 39.6 %
WBC: 5.5 10*3/uL (ref 3.8–10.8)

## 2022-03-10 LAB — HEPATIC FUNCTION PANEL
AG Ratio: 1.4 (calc) (ref 1.0–2.5)
ALT: 14 U/L (ref 6–29)
AST: 17 U/L (ref 10–30)
Albumin: 4.6 g/dL (ref 3.6–5.1)
Alkaline phosphatase (APISO): 54 U/L (ref 31–125)
Bilirubin, Direct: 0.1 mg/dL (ref 0.0–0.2)
Globulin: 3.2 g/dL (calc) (ref 1.9–3.7)
Indirect Bilirubin: 0.3 mg/dL (calc) (ref 0.2–1.2)
Total Bilirubin: 0.4 mg/dL (ref 0.2–1.2)
Total Protein: 7.8 g/dL (ref 6.1–8.1)

## 2022-03-10 LAB — LIPID PANEL
Cholesterol: 231 mg/dL — ABNORMAL HIGH (ref <200)
HDL: 56 mg/dL (ref >=50)
LDL Cholesterol (Calc): 157 mg/dL (calc) — ABNORMAL HIGH
Non-HDL Cholesterol (Calc): 175 mg/dL (calc) — ABNORMAL HIGH (ref <130)
Total CHOL/HDL Ratio: 4.1 (calc) (ref <5.0)
Triglycerides: 76 mg/dL (ref <150)

## 2022-03-10 LAB — BASIC METABOLIC PANEL
BUN/Creatinine Ratio: 15 (calc) (ref 6–22)
BUN: 18 mg/dL (ref 7–25)
CO2: 26 mmol/L (ref 20–32)
Calcium: 9.7 mg/dL (ref 8.6–10.2)
Chloride: 102 mmol/L (ref 98–110)
Creat: 1.2 mg/dL — ABNORMAL HIGH (ref 0.50–0.97)
Glucose, Bld: 84 mg/dL (ref 65–99)
Potassium: 4.1 mmol/L (ref 3.5–5.3)
Sodium: 139 mmol/L (ref 135–146)

## 2022-03-10 LAB — TSH: TSH: 1.77 mIU/L

## 2022-03-12 ENCOUNTER — Telehealth: Payer: Self-pay

## 2022-03-12 DIAGNOSIS — E785 Hyperlipidemia, unspecified: Secondary | ICD-10-CM

## 2022-03-12 NOTE — Telephone Encounter (Signed)
-----  Message from Midge Minium, MD sent at 03/10/2022  3:55 PM EST ----- Labs look great w/ exception of cholesterol.  Total cholesterol and LDL (bad cholesterol) are both well above goal.  Please start OTC Red Yeast Rice supplement daily to help lower these numbers while working on healthy diet and regular exercise.  We will schedule a lab visit in 6 months to recheck cholesterol and liver functions (dx hyperlipidemia)

## 2022-03-12 NOTE — Telephone Encounter (Signed)
Spoke with pt regarding labs and instructions.   

## 2022-03-21 ENCOUNTER — Ambulatory Visit
Admission: EM | Admit: 2022-03-21 | Discharge: 2022-03-21 | Disposition: A | Discharge status: Home / Self Care | Payer: Medicaid Other | Attending: Internal Medicine | Admitting: Internal Medicine

## 2022-03-21 ENCOUNTER — Ambulatory Visit (INDEPENDENT_AMBULATORY_CARE_PROVIDER_SITE_OTHER): Payer: Medicaid Other

## 2022-03-21 DIAGNOSIS — R509 Fever, unspecified: Secondary | ICD-10-CM

## 2022-03-21 DIAGNOSIS — J069 Acute upper respiratory infection, unspecified: Secondary | ICD-10-CM | POA: Diagnosis present

## 2022-03-21 DIAGNOSIS — J029 Acute pharyngitis, unspecified: Secondary | ICD-10-CM

## 2022-03-21 DIAGNOSIS — R059 Cough, unspecified: Secondary | ICD-10-CM | POA: Diagnosis not present

## 2022-03-21 LAB — POCT RAPID STREP A (OFFICE): Rapid Strep A Screen: NEGATIVE

## 2022-03-21 MED ORDER — PROMETHAZINE-DM 6.25-15 MG/5ML PO SYRP: 5.0000 mL | ORAL_SOLUTION | Freq: Four times a day (QID) | ORAL | 0 refills | Status: DC | PRN

## 2022-03-21 MED ORDER — ACETAMINOPHEN 325 MG PO TABS
650.0000 mg | ORAL_TABLET | Freq: Once | ORAL | Status: AC
  Administered 2022-03-21: 650 mg via ORAL

## 2022-03-21 MED ORDER — ALBUTEROL SULFATE HFA 108 (90 BASE) MCG/ACT IN AERS
1.0000 | INHALATION_SPRAY | Freq: Four times a day (QID) | RESPIRATORY_TRACT | 0 refills | Status: DC | PRN
Start: 2022-03-21 — End: 2022-06-19

## 2022-03-21 MED ORDER — PREDNISONE 20 MG PO TABS: 40.0000 mg | ORAL_TABLET | Freq: Every day | ORAL | 0 refills | Status: AC

## 2022-03-21 NOTE — ED Provider Notes (Signed)
[facility]    CSN: [id number] Arrival date & time: 03/21/22  1405      History   Chief Complaint Chief Complaint  Patient presents with   Generalized Body Aches    HPI Bridget Mcbride is a 32 y.o. female.   Patient presents with sore throat, nasal congestion, sneezing, cough, generalized body aches, chills, fatigue, sinus pressure that started about 5 days ago.  Tmax at home was 103 yesterday. Patient does not report medications for symptoms.  Patient also reporting some chest discomfort that is constant.  Denies history of asthma or any associated shortness of breath.  Denies nausea, vomiting, diarrhea, abdominal pain.  Reports her "partner" has similar symptoms.     Past Medical History:  Diagnosis Date   Anemia    BV (bacterial vaginosis)    Chlamydia    DVT (deep venous thrombosis) (HCC)    Assoc w/OCP's   GERD (gastroesophageal reflux disease)    HSV infection    IBS (irritable bowel syndrome)     Patient Active Problem List   Diagnosis Date Noted   S/P cesarean section 04/22/2020   Normal postpartum course 04/22/2020   Deep vein thrombosis (DVT) during current hospitalization (HCC) 04/22/2020   Preeclampsia, severe 04/21/2020   Incompetent cervix 03/06/2018   Physical exam 08/04/2013    Past Surgical History:  Procedure Laterality Date   CERVICAL CERCLAGE N/A 11/25/2019   Procedure: CERCLAGE CERVICAL;  Surgeon: Osborn Coho, MD;  Location: [facility] LD ORS;  Service: Gynecology;  Laterality: N/A;   CESAREAN SECTION N/A 04/21/2020   Procedure: CESAREAN SECTION;  Surgeon: Jaymes Graff, MD;  Location: [facility] LD ORS;  Service: Obstetrics;  Laterality: N/A;   NO PAST SURGERIES      OB History     Gravida  2   Para  2   Term      Preterm  2   AB      Living  2      SAB      IAB      Ectopic      Multiple  0   Live Births  2            Home Medications    Prior to Admission medications   Medication Sig Start Date End  Date Taking? Authorizing Provider  albuterol (VENTOLIN HFA) 108 (90 Base) MCG/ACT inhaler Inhale 1-2 puffs into the lungs every 6 (six) hours as needed for wheezing or shortness of breath. 03/21/22  Yes Hondo Nanda, Rolly Salter E, FNP  predniSONE (DELTASONE) 20 MG tablet Take 2 tablets (40 mg total) by mouth daily for 5 days. 03/21/22 03/26/22 Yes Daishaun Ayre, Acie Fredrickson, FNP  promethazine-dextromethorphan (PROMETHAZINE-DM) 6.25-15 MG/5ML syrup Take 5 mLs by mouth every 6 (six) hours as needed for cough. 03/21/22  Yes Dazaria Macneill, Rolly Salter E, FNP  acetaminophen (TYLENOL) 500 MG tablet Take 500 mg by mouth every 4 (four) hours as needed for mild pain or headache.    [provider]  enoxaparin (LOVENOX) 120 MG/0.8ML injection Inject 0.8 mLs (120 mg total) into the skin daily. 04/26/20 06/25/20  Roma Schanz, CNM    Family History Family History  Problem Relation Age of Onset   Migraines Father    Diabetes Paternal Aunt    Arthritis Paternal Grandmother    Hypertension Mother    Hypertension Maternal Aunt    Hypertension Maternal Uncle    Stroke Maternal Uncle    Hypertension Maternal Aunt    Hypertension Maternal Aunt  Hypertension Maternal Aunt    Stroke Cousin     Social History Social History   Tobacco Use   Smoking status: Never   Smokeless tobacco: Never  Vaping Use   Vaping Use: Never used  Substance Use Topics   Alcohol use: Not Currently    Alcohol/week: 0.0 standard drinks of alcohol    Comment: occasional   Drug use: No     Allergies   Patient has no known allergies.   Review of Systems Review of Systems Per HPI  Physical Exam Triage Vital Signs ED Triage Vitals [03/21/22 1445]  Enc Vitals Group     BP 102/66     Pulse Rate 100     Resp 20     Temp (!) 102.8 F (39.3 C)     Temp Source Oral     SpO2 96 %     Weight      Height      Head Circumference      Peak Flow      Pain Score 8     Pain Loc      Pain Edu?      Excl. in [place]?    No data found.  Updated Vital  Signs BP 102/66 (BP Location: Right Arm)   Pulse 98   Temp (!) 100.7 F (38.2 C)   Resp 20   SpO2 96%   Breastfeeding No   Visual Acuity Right Eye Distance:   Left Eye Distance:   Bilateral Distance:    Right Eye Near:   Left Eye Near:    Bilateral Near:     Physical Exam Constitutional:      General: She is not in acute distress.    Appearance: Normal appearance. She is not toxic-appearing or diaphoretic.  HENT:     Head: Normocephalic and atraumatic.     Right Ear: Tympanic membrane and ear canal normal.     Left Ear: Tympanic membrane and ear canal normal.     Nose: Congestion present.     Mouth/Throat:     Mouth: Mucous membranes are moist.     Pharynx: Posterior oropharyngeal erythema present.  Eyes:     Extraocular Movements: Extraocular movements intact.     Conjunctiva/sclera: Conjunctivae normal.     Pupils: Pupils are equal, round, and reactive to light.  Cardiovascular:     Rate and Rhythm: Normal rate and regular rhythm.     Pulses: Normal pulses.     Heart sounds: Normal heart sounds.  Pulmonary:     Effort: Pulmonary effort is normal. No respiratory distress.     Breath sounds: Normal breath sounds. No stridor. No wheezing, rhonchi or rales.  Abdominal:     General: Abdomen is flat. Bowel sounds are normal.     Palpations: Abdomen is soft.  Musculoskeletal:        General: Normal range of motion.     Cervical back: Normal range of motion.  Skin:    General: Skin is warm and dry.  Neurological:     General: No focal deficit present.     Mental Status: She is alert and oriented to person, place, and time. Mental status is at baseline.  Psychiatric:        Mood and Affect: Mood normal.        Behavior: Behavior normal.      UC Treatments / Results  Labs (all labs ordered are listed, but only abnormal results are displayed) Labs Reviewed  CULTURE,  GROUP A STREP [facility])  POCT RAPID STREP A (OFFICE)    EKG   Radiology DG Chest 2  View  Result Date: 03/21/2022 CLINICAL DATA:  Cough, sore throat, headache, sinus pressure and nasal congestion. Body aches, chills, fever, fatigue. EXAM: CHEST - 2 VIEW COMPARISON:  None available. FINDINGS: Trachea is midline. Heart size normal. Lungs are clear. No pleural fluid. IMPRESSION: No acute findings. Electronically Signed   By: Lorin Picket M.D.   On: 03/21/2022 15:24    Procedures Procedures (including critical care time)  Medications Ordered in UC Medications  acetaminophen (TYLENOL) tablet 650 mg (650 mg Oral Given 03/21/22 1448)    Initial Impression / Assessment and Plan / UC Course  I have reviewed the triage vital signs and the nursing notes.  Pertinent labs & imaging results that were available during my care of the patient were reviewed by me and considered in my medical decision making (see chart for details).     Patient presents with symptoms likely from a viral upper respiratory infection. Symptoms seem unlikely related to ACS, CHF or COPD exacerbations, pneumonia, pneumothorax. Patient is nontoxic appearing and not in need of emergent medical intervention.  Patient declined COVID testing given that she had a negative COVID test at home.  Do not have flu testing capabilities here in urgent care at this time but given duration of symptoms, it would not change treatment.  Chest x-ray completed given chest discomfort and cough which was negative for any acute cardiopulmonary process.  No concern for cardiac etiology causing chest discomfort as it is most likely due to viral illness and inflammation in chest.  Will treat with prednisone and albuterol inhaler.  Cough medications also prescribed.  Advised patient cough medication can cause drowsiness. Recommended symptom control with medications and supportive care as well. Fever monitoring and management discussed with patient. Tylenol administered in urgent care with improvement in fever.   Return if symptoms fail to  improve. Patient advised to go to the ER if chest discomfort worsens as well.  Patient states understanding and is agreeable.  Discharged with PCP followup.  Final Clinical Impressions(s) / UC Diagnoses   Final diagnoses:  Viral upper respiratory tract infection with cough  Sore throat     Discharge Instructions      Rapid strep was negative.  Throat culture is pending.  Will call if it is abnormal.  It appears that you have a viral illness that should run its course and self resolve with symptomatic treatment.  I have prescribed you 3 medications including an inhaler that you will use as needed for chest discomfort, excessive coughing, chest tightness.  Monitor fevers very closely as well and treat with Tylenol or Motrin.  Advised adequate fluid and rest.  Follow-up if symptoms persist or worsen.  Go straight to the ER if chest discomfort worsens.     ED Prescriptions     Medication Sig Dispense Auth. Provider   predniSONE (DELTASONE) 20 MG tablet Take 2 tablets (40 mg total) by mouth daily for 5 days. 10 tablet [place], Noonday E, [place]   promethazine-dextromethorphan (PROMETHAZINE-DM) 6.25-15 MG/5ML syrup Take 5 mLs by mouth every 6 (six) hours as needed for cough. 118 mL Chareese Sergent, Hildred Alamin E, [place]   albuterol (VENTOLIN HFA) 108 (90 Base) MCG/ACT inhaler Inhale 1-2 puffs into the lungs every 6 (six) hours as needed for wheezing or shortness of breath. 1 each Teodora Medici,       PDMP not reviewed this encounter.  Teodora Medici, [place] 03/21/22 [phone]

## 2022-03-21 NOTE — Discharge Instructions (Signed)
Rapid strep was negative.  Throat culture is pending.  Will call if it is abnormal.  It appears that you have a viral illness that should run its course and self resolve with symptomatic treatment.  I have prescribed you 3 medications including an inhaler that you will use as needed for chest discomfort, excessive coughing, chest tightness.  Monitor fevers very closely as well and treat with Tylenol or Motrin.  Advised adequate fluid and rest.  Follow-up if symptoms persist or worsen.  Go straight to the ER if chest discomfort worsens.

## 2022-03-21 NOTE — ED Triage Notes (Signed)
Pt c/o cough, sore throat, headache, sinus pressure, nasal congestion, body aches chills, fever, fatigue, malaise,   Onset ~ sat

## 2022-03-24 LAB — CULTURE, GROUP A STREP (THRC)

## 2022-06-15 ENCOUNTER — Telehealth: Payer: Self-pay | Admitting: Family Medicine

## 2022-06-15 NOTE — Telephone Encounter (Signed)
Spoke to the pt and advised she needs an apt to be seen by DR Beverely Low for further testing and recommendations. Pt is calling our office to scheduled since I am in a different office today and am unable to do so

## 2022-06-15 NOTE — Telephone Encounter (Signed)
Caller name: CLOVER FEEHAN  On [place]?: Yes  Call back number: [phone] (mobile)  Provider they see: Sheliah Hatch, MD  Reason for call:  Pt states that after taking the vitamins Red Yeast Rice for her cholesterol. She had hair density  as well as constipation and stomach cramps.Hair loss was after virus. And is there something else you suggest? Is there a test to check if there is disease-Advise

## 2022-06-15 NOTE — Telephone Encounter (Signed)
Scheduled appt for pt - thanks!

## 2022-06-19 ENCOUNTER — Encounter: Payer: Self-pay | Admitting: Family Medicine

## 2022-06-19 ENCOUNTER — Ambulatory Visit (INDEPENDENT_AMBULATORY_CARE_PROVIDER_SITE_OTHER): Payer: Medicaid Other | Admitting: Family Medicine

## 2022-06-19 ENCOUNTER — Ambulatory Visit: Payer: Medicaid Other | Admitting: Family Medicine

## 2022-06-19 VITALS — BP 120/68 | HR 95 | Temp 98.5°F | Resp 17 | Ht 64.0 in | Wt 144.1 lb

## 2022-06-19 DIAGNOSIS — R41840 Attention and concentration deficit: Secondary | ICD-10-CM | POA: Diagnosis not present

## 2022-06-19 DIAGNOSIS — L659 Nonscarring hair loss, unspecified: Secondary | ICD-10-CM

## 2022-06-19 DIAGNOSIS — E785 Hyperlipidemia, unspecified: Secondary | ICD-10-CM | POA: Diagnosis not present

## 2022-06-19 LAB — LIPID PANEL
Cholesterol: 193 mg/dL (ref 0–200)
HDL: 49.2 mg/dL (ref >39.00)
LDL Cholesterol: 131 mg/dL — ABNORMAL HIGH (ref 0–99)
NonHDL: 143.98
Total CHOL/HDL Ratio: 4
Triglycerides: 64 mg/dL (ref 0.0–149.0)
VLDL: 12.8 mg/dL (ref 0.0–40.0)

## 2022-06-19 LAB — BASIC METABOLIC PANEL
BUN: 16 mg/dL (ref 6–23)
CO2: 28 mEq/L (ref 19–32)
Calcium: 9.6 mg/dL (ref 8.4–10.5)
Chloride: 101 mEq/L (ref 96–112)
Creatinine, Ser: 1.03 mg/dL (ref 0.40–1.20)
GFR: 71.77 mL/min (ref >60.00)
Glucose, Bld: 75 mg/dL (ref 70–99)
Potassium: 4.2 mEq/L (ref 3.5–5.1)
Sodium: 135 mEq/L (ref 135–145)

## 2022-06-19 LAB — CBC WITH DIFFERENTIAL/PLATELET
Basophils Absolute: 0 10*3/uL (ref 0.0–0.1)
Basophils Relative: 0.7 % (ref 0.0–3.0)
Eosinophils Absolute: 0.1 10*3/uL (ref 0.0–0.7)
Eosinophils Relative: 2.2 % (ref 0.0–5.0)
HCT: 42.4 % (ref 36.0–46.0)
Hemoglobin: 14.2 g/dL (ref 12.0–15.0)
Lymphocytes Relative: 38.3 % (ref 12.0–46.0)
Lymphs Abs: 1.8 10*3/uL (ref 0.7–4.0)
MCHC: 33.5 g/dL (ref 30.0–36.0)
MCV: 88.1 fl (ref 78.0–100.0)
Monocytes Absolute: 0.3 10*3/uL (ref 0.1–1.0)
Monocytes Relative: 6.4 % (ref 3.0–12.0)
Neutro Abs: 2.5 10*3/uL (ref 1.4–7.7)
Neutrophils Relative %: 52.4 % (ref 43.0–77.0)
Platelets: 245 10*3/uL (ref 150.0–400.0)
RBC: 4.82 Mil/uL (ref 3.87–5.11)
RDW: 13.3 % (ref 11.5–15.5)
WBC: 4.8 10*3/uL (ref 4.0–10.5)

## 2022-06-19 LAB — HEPATIC FUNCTION PANEL
ALT: 19 U/L (ref 0–35)
AST: 28 U/L (ref 0–37)
Albumin: 4.4 g/dL (ref 3.5–5.2)
Alkaline Phosphatase: 48 U/L (ref 39–117)
Bilirubin, Direct: 0.1 mg/dL (ref 0.0–0.3)
Total Bilirubin: 0.6 mg/dL (ref 0.2–1.2)
Total Protein: 7.8 g/dL (ref 6.0–8.3)

## 2022-06-19 LAB — TSH: TSH: 1.64 u[IU]/mL (ref 0.35–5.50)

## 2022-06-19 LAB — VITAMIN D 25 HYDROXY (VIT D DEFICIENCY, FRACTURES): VITD: 35.06 ng/mL (ref 30.00–100.00)

## 2022-06-19 NOTE — Assessment & Plan Note (Signed)
Noted at last visit when LDL was 157.  She was intolerant to Red Yeast Rice due to GI side effects.  She is hoping to avoid prescription medication but is aware of the risk/complications of not treating high cholesterol.  Repeat labs today and determine the next step.

## 2022-06-19 NOTE — Patient Instructions (Signed)
Follow up as needed or as scheduled We'll notify you of your lab results and make any changes if needed You can add Biotin (over the counter) to help w/ hair growth) Keep up the good work on healthy diet and regular exercise- you look great! Call with any questions or concerns Happy Early Birthday!!!

## 2022-06-19 NOTE — Progress Notes (Signed)
   Subjective:    Patient ID: Bridget Mcbride, female    DOB: Jul 05, 1990, 32 y.o.   MRN: [id number]  HPI Hair loss- pt reports after a viral illness in January she lost ~50% of her hair.  She reports she has had similar responses after other viral illnesses but wants to make sure there is nothing metabolic going on.  Hyperlipidemia- last LDL 157.  pt attempted Red Yeast Rice but was intolerant due to stomach cramps.  Pt reports she has been eating better.  Difficulty concentrating- pt is wondering about an ADHD evaluation as she is having difficulty concentrating.   Review of Systems For ROS see HPI     Objective:   Physical Exam Vitals reviewed.  Constitutional:      General: She is not in acute distress.    Appearance: Normal appearance. She is well-developed. She is not ill-appearing.  HENT:     Head: Normocephalic and atraumatic.  Eyes:     Conjunctiva/sclera: Conjunctivae normal.     Pupils: Pupils are equal, round, and reactive to light.  Neck:     Thyroid: No thyromegaly.  Cardiovascular:     Rate and Rhythm: Normal rate and regular rhythm.     Pulses: Normal pulses.     Heart sounds: Normal heart sounds. No murmur heard. Pulmonary:     Effort: Pulmonary effort is normal. No respiratory distress.     Breath sounds: Normal breath sounds.  Abdominal:     General: There is no distension.     Palpations: Abdomen is soft.     Tenderness: There is no abdominal tenderness.  Musculoskeletal:     Cervical back: Normal range of motion and neck supple.     Right lower leg: No edema.     Left lower leg: No edema.  Lymphadenopathy:     Cervical: No cervical adenopathy.  Skin:    General: Skin is warm and dry.  Neurological:     Mental Status: She is alert and oriented to person, place, and time.  Psychiatric:        Behavior: Behavior normal.          Assessment & Plan:  Hair loss- new.  Pt reports she lost quite a bit of hair after having COVID.  She had  another viral illness in late January and reports that she lost 'almost 50% of her hair'.  She feels this is likely due to the viral illness but wants to r/o any underlying cause.  Will check labs and address any abnormalities if present.  Concentration problem- new.  Pt would like to be assessed for possible adult ADHD.  Referral placed to [facility].

## 2022-06-20 LAB — IRON,TIBC AND FERRITIN PANEL
%SAT: 49 % (calc) — ABNORMAL HIGH (ref 16–45)
Ferritin: 89 ng/mL (ref 16–154)
Iron: 173 ug/dL (ref 40–190)
TIBC: 351 mcg/dL (calc) (ref 250–450)

## 2022-06-21 ENCOUNTER — Telehealth: Payer: Self-pay

## 2022-06-21 NOTE — Telephone Encounter (Signed)
Patient called stating that she had a question regarding her LDL, she sees it is still elevated and wanted some advice on how she could get that lower. I let her know that I would ask Dr. Beverely Low to get clarification and we would give her a call back

## 2022-06-21 NOTE — Telephone Encounter (Signed)
It is mildly elevated but the ratio of good to bad cholesterol is great!  I was thrilled with the improvement/result.  Continued efforts at healthy diet and regular exercise will keep that number in check or even lower it.

## 2022-06-21 NOTE — Telephone Encounter (Signed)
LM

## 2022-06-22 NOTE — Telephone Encounter (Signed)
Spoke with patient let her know good to bad cholesterol is great and to continue healthy diet and exercise. Pt states understanding.

## 2022-06-25 ENCOUNTER — Telehealth: Payer: Self-pay

## 2022-06-25 NOTE — Telephone Encounter (Signed)
I have spoken to the pt and explained the above message . She expressed verbal understanding

## 2022-06-25 NOTE — Telephone Encounter (Signed)
Pt requesting referral changed to [facility] due to knowing several providers at the current requested location

## 2022-06-25 NOTE — Telephone Encounter (Signed)
If she knows providers there and they accept her insurance, she doesn't need a referral and can schedule at her convenience

## 2022-07-18 ENCOUNTER — Telehealth: Payer: Self-pay

## 2022-07-18 NOTE — Telephone Encounter (Signed)
Pt called in stating she is having slight SOB and numbness and tingling in her R lower leg. Pt has a hx of DVT. Pt states she was recently prescribed dextroamphetamine and wonders if this is causing her symptoms. Spoke with Eileen Stanford who recommends that she go to the ER with those symptoms as she may have another DVT. Advised pt who inquired again about if this was related to her new ADHD med- told pt we were unsure if that is a s/e of the medication. Recommended ER visit. Pt stated understanding.

## 2022-07-19 ENCOUNTER — Emergency Department (HOSPITAL_BASED_OUTPATIENT_CLINIC_OR_DEPARTMENT_OTHER)
Admission: EM | Admit: 2022-07-19 | Discharge: 2022-07-19 | Disposition: A | Discharge status: Home / Self Care | Payer: Medicaid Other | Attending: Emergency Medicine | Admitting: Emergency Medicine

## 2022-07-19 ENCOUNTER — Encounter (HOSPITAL_BASED_OUTPATIENT_CLINIC_OR_DEPARTMENT_OTHER): Payer: Self-pay | Admitting: Emergency Medicine

## 2022-07-19 ENCOUNTER — Emergency Department (HOSPITAL_BASED_OUTPATIENT_CLINIC_OR_DEPARTMENT_OTHER): Payer: Medicaid Other

## 2022-07-19 ENCOUNTER — Other Ambulatory Visit (HOSPITAL_BASED_OUTPATIENT_CLINIC_OR_DEPARTMENT_OTHER): Payer: Self-pay

## 2022-07-19 ENCOUNTER — Other Ambulatory Visit: Payer: Self-pay

## 2022-07-19 ENCOUNTER — Emergency Department (HOSPITAL_BASED_OUTPATIENT_CLINIC_OR_DEPARTMENT_OTHER): Payer: Medicaid Other | Admitting: Radiology

## 2022-07-19 DIAGNOSIS — R0602 Shortness of breath: Secondary | ICD-10-CM | POA: Diagnosis not present

## 2022-07-19 DIAGNOSIS — R202 Paresthesia of skin: Secondary | ICD-10-CM | POA: Insufficient documentation

## 2022-07-19 DIAGNOSIS — R944 Abnormal results of kidney function studies: Secondary | ICD-10-CM | POA: Diagnosis not present

## 2022-07-19 DIAGNOSIS — R0789 Other chest pain: Secondary | ICD-10-CM | POA: Diagnosis not present

## 2022-07-19 DIAGNOSIS — R208 Other disturbances of skin sensation: Secondary | ICD-10-CM

## 2022-07-19 HISTORY — DX: Other specified behavioral and emotional disorders with onset usually occurring in childhood and adolescence: F98.8

## 2022-07-19 LAB — CBC
HCT: 40.7 % (ref 36.0–46.0)
Hemoglobin: 13.9 g/dL (ref 12.0–15.0)
MCH: 29.4 pg (ref 26.0–34.0)
MCHC: 34.2 g/dL (ref 30.0–36.0)
MCV: 86.2 fL (ref 80.0–100.0)
Platelets: 182 10*3/uL (ref 150–400)
RBC: 4.72 MIL/uL (ref 3.87–5.11)
RDW: 12.2 % (ref 11.5–15.5)
WBC: 4.6 10*3/uL (ref 4.0–10.5)
nRBC: 0 % (ref 0.0–0.2)

## 2022-07-19 LAB — BASIC METABOLIC PANEL
Anion gap: 6 (ref 5–15)
BUN: 16 mg/dL (ref 6–20)
CO2: 27 mmol/L (ref 22–32)
Calcium: 9.2 mg/dL (ref 8.9–10.3)
Chloride: 103 mmol/L (ref 98–111)
Creatinine, Ser: 1.08 mg/dL — ABNORMAL HIGH (ref 0.44–1.00)
GFR, Estimated: >60 mL/min (ref >60)
Glucose, Bld: 80 mg/dL (ref 70–99)
Potassium: 4.2 mmol/L (ref 3.5–5.1)
Sodium: 136 mmol/L (ref 135–145)

## 2022-07-19 LAB — TROPONIN I (HIGH SENSITIVITY): Troponin I (High Sensitivity): <2 ng/L (ref <18)

## 2022-07-19 LAB — HCG, QUANTITATIVE, PREGNANCY: hCG, Beta Chain, Quant, S: <1 m[IU]/mL (ref <5)

## 2022-07-19 MED ORDER — IOHEXOL 350 MG/ML SOLN
100.0000 mL | Freq: Once | INTRAVENOUS | Status: AC | PRN
  Administered 2022-07-19: 60 mL via INTRAVENOUS

## 2022-07-19 NOTE — Discharge Instructions (Signed)
Note the workup today was overall reassuring.  As discussed, you do not have a blood clot in the leg or in the lung.  CT scan of your head was without signs of abnormality.  I recommend follow-up with neurology given the numbness/tingling sensation in your right lower leg for further assessment/evaluation.  Please do not hesitate to return to emergency department for worrisome signs or symptoms we discussed become apparent.

## 2022-07-19 NOTE — ED Provider Notes (Signed)
[place] EMERGENCY DEPARTMENT AT [facility] Provider Note   CSN: [id number] Arrival date & time: 07/19/22  0831     History  Chief Complaint  Patient presents with   Numbness    Bridget Mcbride is a 32 y.o. female.  HPI   32 year old female presents emergency department with complaints of shortness of breath as well as right lower extremity numbness/tingling.  Patient reports shortness of breath for the past 2 weeks.  She states that she just recently began taking Adderall and no shortness of breath approximately 2 hours after taking said medication with relief of symptoms once medication "wears off."  Reports yesterday experiencing some left sided chest pain sharp in nature worsened with taking a deep breath.  She also reports 2 weeks of intermittent right lower extremity numbness/tingling.  Describes symptoms located behind right knee involving posterior aspect of right leg from knee down to ankle as well as some sensory deficits on the anterior aspect of right lower leg.  Denies any weakness in lower extremities, saddle anesthesia, bowel/bladder dysfunction, fever, low back pain, history of IV drug use, prolonged corticosteroid use.  Patient does states she has a history of DVT back in 2017 and had similar numbness/tingling sensation prior to diagnosis and appearance of swelling/pain.  She is concerned of the same.  Denies any cough, congestion, abdominal pain, nausea, vomiting, urinary symptoms, vaginal symptoms, change in bowel habits.  Denies any visual disturbance, gait abnormality, slurred speech, facial droop.  Past medical history significant for DVT, GERD, HSV, IBS, ADD, anemia, BV  Home Medications Prior to Admission medications   Medication Sig Start Date End Date Taking? Authorizing Provider  amphetamine-dextroamphetamine (ADDERALL) 5 MG tablet Take 5 mg by mouth 2 (two) times daily with a meal.   Yes [provider]  acetaminophen (TYLENOL) 500 MG  tablet Take 500 mg by mouth every 4 (four) hours as needed for mild pain or headache.    [provider]  enoxaparin (LOVENOX) 120 MG/0.8ML injection Inject 0.8 mLs (120 mg total) into the skin daily. 04/26/20 06/25/20  Roma Schanz, CNM      Allergies    Procardia [nifedipine]    Review of Systems   Review of Systems  All other systems reviewed and are negative.   Physical Exam Updated Vital Signs BP 107/62 (BP Location: Right Arm)   Pulse 75   Temp 99 F (37.2 C) (Oral)   Resp 12   Ht 5\' 4"  (1.626 m)   Wt 63.5 kg   LMP 06/20/2022 (Approximate)   SpO2 100%   BMI 24.03 kg/m  Physical Exam Vitals and nursing note reviewed.  Constitutional:      General: She is not in acute distress.    Appearance: She is well-developed.  HENT:     Head: Normocephalic and atraumatic.  Eyes:     Conjunctiva/sclera: Conjunctivae normal.  Cardiovascular:     Rate and Rhythm: Normal rate and regular rhythm.     Heart sounds: No murmur heard. Pulmonary:     Effort: Pulmonary effort is normal. No respiratory distress.     Breath sounds: Normal breath sounds. No wheezing, rhonchi or rales.     Comments: Left sided chest wall tenderness to palpation. Abdominal:     Palpations: Abdomen is soft.     Tenderness: There is no abdominal tenderness. There is no guarding.  Musculoskeletal:        General: No swelling, tenderness or deformity.     Cervical back:  Neck supple.     Right lower leg: No edema.     Left lower leg: No edema.     Comments: No midline tenderness of cervical, thoracic, lumbar spine with no obvious step-off or deformity noted.  No obvious paraspinal tenderness of the lumbar region.  Muscular strength 5 out of 5 for hip flexion/extension, knee flexion/extension, ankle dorsi/plantarflexion.  Patient with reported sensory deficits along posterior aspect of right leg from popliteal fossa down to right ankle.  DTR symmetric bilaterally for bilateral lower extremities  Skin:     General: Skin is warm and dry.     Capillary Refill: Capillary refill takes less than 2 seconds.  Neurological:     Mental Status: She is alert.     Comments: Alert and oriented to self, place, time and event.   Speech is fluent, clear without dysarthria or dysphasia.   Strength 5/5 in upper/lower extremities   Sensation intact in upper/lower extremities   Normal gait.  Negative Romberg. No pronator drift.  Normal finger-to-nose and feet tapping.  CN I not tested  CN II not tested CN III, IV, VI PERRLA and EOMs intact bilaterally  CN V Intact sensation to sharp and light touch to the face  CN VII facial movements symmetric  CN VIII not tested  CN IX, X no uvula deviation, symmetric rise of soft palate  CN XI 5/5 SCM and trapezius strength bilaterally  CN XII Midline tongue protrusion, symmetric L/R movements   Psychiatric:        Mood and Affect: Mood normal.     ED Results / Procedures / Treatments   Labs (all labs ordered are listed, but only abnormal results are displayed) Labs Reviewed  BASIC METABOLIC PANEL - Abnormal; Notable for the following components:      Result Value   Creatinine, Ser 1.08 (*)    All other components within normal limits  CBC  HCG, QUANTITATIVE, PREGNANCY  TROPONIN I (HIGH SENSITIVITY)    EKG EKG Interpretation  Date/Time:  Thursday Jul 19 2022 08:53:40 EDT Ventricular Rate:  80 PR Interval:  160 QRS Duration: 107 QT Interval:  375 QTC Calculation: 433 R Axis:   60 Text Interpretation: Sinus rhythm Right atrial enlargement Confirmed by Blane Ohara [phone]) on 07/19/2022 9:04:51 AM  Radiology CT Head Wo Contrast  Result Date: 07/19/2022 CLINICAL DATA:  Neuro deficit, acute, stroke suspected. Intermittent right lower leg numbness/tingling x2 weeks. EXAM: CT HEAD WITHOUT CONTRAST TECHNIQUE: Contiguous axial images were obtained from the base of the skull through the vertex without intravenous contrast. RADIATION DOSE REDUCTION: This  exam was performed according to the departmental dose-optimization program which includes automated exposure control, adjustment of the mA and/or kV according to patient size and/or use of iterative reconstruction technique. COMPARISON:  None Available. FINDINGS: Brain: No acute intracranial hemorrhage. Gray-white differentiation is preserved. No hydrocephalus or extra-axial collection. No mass effect or midline shift. Vascular: No hyperdense vessel or unexpected calcification. Skull: No calvarial fracture or suspicious bone lesion. Skull base is unremarkable. Sinuses/Orbits: Unremarkable. Other: None. IMPRESSION: No acute intracranial abnormality. Electronically Signed   By: Orvan Falconer M.D.   On: 07/19/2022 12:22   CT Angio Chest PE W/Cm &/Or Wo Cm  Result Date: 07/19/2022 CLINICAL DATA:  Shortness of breath and intermittent right lower leg numbness and tingling. History of DVT in 2017. Not currently on a blood thinner. EXAM: CT ANGIOGRAPHY CHEST WITH CONTRAST TECHNIQUE: Multidetector CT imaging of the chest was performed using the standard protocol  during bolus administration of intravenous contrast. Multiplanar CT image reconstructions and MIPs were obtained to evaluate the vascular anatomy. RADIATION DOSE REDUCTION: This exam was performed according to the departmental dose-optimization program which includes automated exposure control, adjustment of the mA and/or kV according to patient size and/or use of iterative reconstruction technique. CONTRAST:  60mL OMNIPAQUE IOHEXOL 350 MG/ML SOLN COMPARISON:  Chest x-ray Jul 19, 2022 FINDINGS: Cardiovascular: Satisfactory opacification of the pulmonary arteries to the segmental level. No evidence of pulmonary embolism. Normal heart size. No pericardial effusion. Mediastinum/Nodes: No enlarged mediastinal, hilar, or axillary lymph nodes. Thyroid gland, trachea, and esophagus demonstrate no significant findings. Lungs/Pleura: Patent central airways. No focal  pulmonary opacity. No pleural effusion or pneumothorax. 3 mm subpleural solid pulmonary nodule in the anterior right middle lobe (series 100, image 185). Per Fleischner criteria, no additional follow-up required. Upper Abdomen: No acute abnormality. Musculoskeletal: No chest wall abnormality. No acute or significant osseous findings. Review of the MIP images confirms the above findings. IMPRESSION: No evidence of acute pulmonary embolism. Electronically Signed   By: Jacob Moores M.D.   On: 07/19/2022 12:16   US Venous Img Lower Right (DVT Study)  Result Date: 07/19/2022 CLINICAL DATA:  Lower extremity pain and tingling for 2 weeks. Remote history of DVT in 2017 EXAM: Right LOWER EXTREMITY VENOUS DOPPLER ULTRASOUND TECHNIQUE: Gray-scale sonography with compression, as well as color and duplex ultrasound, were performed to evaluate the deep venous system(s) from the level of the common femoral vein through the popliteal and proximal calf veins. COMPARISON:  Ultrasound 04/18/2016 FINDINGS: VENOUS Normal compressibility of the common femoral, superficial femoral, and popliteal veins, as well as the visualized calf veins. Visualized portions of profunda femoral vein and great saphenous vein unremarkable. No filling defects to suggest DVT on grayscale or color Doppler imaging. Doppler waveforms show normal direction of venous flow, normal respiratory plasticity and response to augmentation. Limited views of the contralateral common femoral vein are unremarkable. OTHER None. Limitations: none IMPRESSION: No evidence of right lower extremity DVT Electronically Signed   By: Karen Kays M.D.   On: 07/19/2022 11:11   DG Chest 2 View  Result Date: 07/19/2022 CLINICAL DATA:  Intermittent right lower leg numbness and tingling and shortness of breath. EXAM: CHEST - 2 VIEW COMPARISON:  Chest radiograph 03/21/2022. FINDINGS: The cardiomediastinal silhouette is normal There is no focal consolidation or pulmonary edema.  There is no pleural effusion or pneumothorax There is no acute osseous abnormality. IMPRESSION: No radiographic evidence of acute cardiopulmonary process. Electronically Signed   By: Lesia Hausen M.D.   On: 07/19/2022 09:33    Procedures Procedures    Medications Ordered in ED Medications  iohexol (OMNIPAQUE) 350 MG/ML injection 100 mL (60 mLs Intravenous Contrast Given 07/19/22 1149)    ED Course/ Medical Decision Making/ A&P Clinical Course as of 07/19/22 1555  Thu Jul 19, 2022  1129 She had decision-making conversation was had with patient regarding obtaining CT imaging of the chest as well as head given right lower extremity sensory deficits as well as symptoms of shortness of breath with history of DVT.  Will reassess afterwards. [CR]    Clinical Course User Index [CR] Peter Garter, PA                             Medical Decision Making Amount and/or Complexity of Data Reviewed Labs: ordered. Radiology: ordered.  Risk Prescription drug management.   This patient  presents to the ED for concern of numbness, shortness of breath, this involves an extensive number of treatment options, and is a complaint that carries with it a high risk of complications and morbidity.  The differential diagnosis includes The causes for shortness of breath include but are not limited to Cardiac (AHF, pericardial effusion and tamponade, arrhythmias, ischemia, etc), Respiratory (COPD, asthma, pneumonia, pneumothorax, primary pulmonary hypertension, PE/VQ mismatch), Hematological (anemia).  CVA, cerebral venous thrombosis, spinal cord impingement, peripheral nerve compression, MS    Co morbidities that complicate the patient evaluation  See HPI   Additional history obtained:  Additional history obtained from EMR External records from outside source obtained and reviewed including MRI of lumbar spine in 2018 of which was negative for any acute spinal cord impingement.  Provider at that time  believed it was secondary to peripheral mechanical compression of nerve hospital records   Lab Tests:  I Ordered, and personally interpreted labs.  The pertinent results include: No leukocytosis noted.  No evidence of anemia.  Platelets within range.  No electrolyte abnormalities.  Patient with elevated creatinine 1.08 but seems to be near patient's baseline from prior laboratory studies performed.  Troponin less than 2.  ECG negative.   Imaging Studies ordered:  I ordered imaging studies including chest x-ray, CT head, CT angio chest PE, lower extremity ultrasound I independently visualized and interpreted imaging which showed  Chest x-ray: No acute cardiopulmonary abnormalities CT angio PE: No evidence of PE or other cardiopulmonary abnormalities CT head: No acute intracranial abnormalities Right lower extremity ultrasound: No evidence of DVT I agree with the radiologist interpretation   Cardiac Monitoring: / EKG:  The patient was maintained on a cardiac monitor.  I personally viewed and interpreted the cardiac monitored which showed an underlying rhythm of: Sinus rhythm with right atrial large minute   Consultations Obtained:  I requested consultation with attending physician Dr. Jodi Mourning who is in agreement of treatment plan going forward  Problem List / ED Course / Critical interventions / Medication management  Decreased sensation of right leg, shortness of breath Reevaluation of the patient showed that the patient stayed the same I have reviewed the patients home medicines and have made adjustments as needed   Social Determinants of Health:  Denies tobacco, illicit drug use   Test / Admission - Considered:  Decreased sensation of right leg, shortness of breath Vitals signs  within normal range and stable throughout visit. Laboratory/imaging studies significant for: See above 32 year old female presents emergency department with complaints of decreased sensation in  right lower extremity as well as symptoms of shortness of breath and vague chest pain.  Regarding shortness of breath and chest pain, workup today overall reassuring.  Given duration of symptoms, second troponin was pursued of which was negative; EKG without obvious acute ischemic change.  Patient with heart score of 0-3 so doubt ACS.  Patient with history of DVT so CT imaging to rule out PE was pursued which was negative.  Patient without evidence of pneumonia, COPD/asthma exacerbation, CHF, significant anemia.  Patient's perceived shortness of breath seems to be related to Adderall usage as it worsens after use and seems to be relieved as medication "wears off."  Regarding patient's right lower extremity decreased sensation, DVT imaging was pursued given reported history of similar symptoms with prior DVT.  DVT study was negative.  No evidence of arterial compromise with symmetric pulses.  No obvious appreciable weakness in lower extremities.  Unsure of exact etiology of patient's right  lower extremity decreased sensation.  Patient with similar presentation in the past with negative MRIs of lumbar spine with transient symptoms.  CT imaging of the head was pursued for potential CVA which was negative.  Further workup for patient's right lower extremity decreased sensation deemed unnecessary at this time given duration of patient's symptoms as well as lack of additional findings on exam neurologically.  Will recommend close follow-up in the outpatient setting with neurology for further assessment/evaluation.  Patient overall well-appearing, afebrile in no acute distress.  Treatment plan discussed at length with patient and she acknowledged understanding was agreeable to said plan. Worrisome signs and symptoms were discussed with the patient, and the patient acknowledged understanding to return to the ED if noticed. Patient was stable upon discharge.          Final Clinical Impression(s) / ED  Diagnoses Final diagnoses:  Decreased sensation of leg  Shortness of breath    Rx / DC Orders ED Discharge Orders          Ordered    Ambulatory referral to Neurology       Comments: An appointment is requested in approximately: 2 weeks   07/19/22 1246              Peter Garter, [place] 07/19/22 1555    Blane Ohara, MD 07/21/22 [phone]

## 2022-07-19 NOTE — ED Notes (Signed)
Discharge paperwork given and verbally understood. 

## 2022-07-19 NOTE — ED Triage Notes (Signed)
Pt arrived POV from home. Pt caox4, ambulatory, NAD. Pt c/o intermittent R lower leg numbness/tingling x2 weeks along with SOB. Hx DVT in 2017, not currently on blood thinner. Pt states she started new ADD med (Adderall) 2 wks ago just prior to s/s starting. No obvious resp distress at present.

## 2022-07-20 ENCOUNTER — Encounter: Payer: Self-pay | Admitting: Neurology

## 2022-08-14 ENCOUNTER — Emergency Department (HOSPITAL_BASED_OUTPATIENT_CLINIC_OR_DEPARTMENT_OTHER)
Admission: EM | Admit: 2022-08-14 | Discharge: 2022-08-14 | Disposition: A | Discharge status: Home / Self Care | Payer: Medicaid Other | Attending: Emergency Medicine | Admitting: Emergency Medicine

## 2022-08-14 ENCOUNTER — Other Ambulatory Visit: Payer: Self-pay

## 2022-08-14 ENCOUNTER — Encounter (HOSPITAL_BASED_OUTPATIENT_CLINIC_OR_DEPARTMENT_OTHER): Payer: Self-pay | Admitting: Emergency Medicine

## 2022-08-14 DIAGNOSIS — Y93E1 Activity, personal bathing and showering: Secondary | ICD-10-CM | POA: Diagnosis not present

## 2022-08-14 DIAGNOSIS — H5710 Ocular pain, unspecified eye: Secondary | ICD-10-CM | POA: Diagnosis present

## 2022-08-14 DIAGNOSIS — S0501XA Injury of conjunctiva and corneal abrasion without foreign body, right eye, initial encounter: Secondary | ICD-10-CM | POA: Insufficient documentation

## 2022-08-14 DIAGNOSIS — W458XXA Other foreign body or object entering through skin, initial encounter: Secondary | ICD-10-CM | POA: Diagnosis not present

## 2022-08-14 MED ORDER — ERYTHROMYCIN 5 MG/GM OP OINT: TOPICAL_OINTMENT | OPHTHALMIC | 0 refills | Status: DC

## 2022-08-14 MED ORDER — OFLOXACIN 0.3 % OP SOLN: 1.0000 [drp] | Freq: Four times a day (QID) | OPHTHALMIC | 0 refills | Status: AC

## 2022-08-14 MED ORDER — TETRACAINE HCL 0.5 % OP SOLN
2.0000 [drp] | Freq: Once | OPHTHALMIC | Status: AC
  Administered 2022-08-14: 2 [drp] via OPHTHALMIC
  Filled 2022-08-14: qty 4

## 2022-08-14 MED ORDER — FLUORESCEIN SODIUM 1 MG OP STRP
1.0000 | ORAL_STRIP | Freq: Once | OPHTHALMIC | Status: AC
  Administered 2022-08-14: 1 via OPHTHALMIC
  Filled 2022-08-14: qty 1

## 2022-08-14 MED ORDER — ERYTHROMYCIN 5 MG/GM OP OINT
TOPICAL_OINTMENT | Freq: Once | OPHTHALMIC | Status: AC
  Filled 2022-08-14: qty 3.5

## 2022-08-14 MED ORDER — ARTIFICIAL TEARS OPHTHALMIC OINT
TOPICAL_OINTMENT | Freq: Once | OPHTHALMIC | Status: AC
  Filled 2022-08-14: qty 3.5

## 2022-08-14 NOTE — ED Triage Notes (Signed)
Pt arrives to ED with c/o right eye irritation after getting body wash ion the eye yesterday.

## 2022-08-14 NOTE — Discharge Instructions (Addendum)
You have corneal abrasion, that is about 50% diameter of your cornea. This necessitates close follow-up with ophthalmology.  Please call them for a prompt follow-up in 1 to 2 days.  Apply the antibiotic ointment as prescribed. Use the artificial tears that we provided every 2 hours as needed for eye irritation.

## 2022-08-14 NOTE — ED Provider Notes (Signed)
[place] EMERGENCY DEPARTMENT AT [facility] Provider Note   CSN: [id number] Arrival date & time: 08/14/22  0846     History  Chief Complaint  Patient presents with   Eye Problem    Bridget Mcbride is a 32 y.o. female.  HPI    32 year old female comes in with chief complaint of eye problem.  Patient states that last night she went to take a shower around midnight, and the body wash splashed into her eye.  She had immediate pain and discomfort.  She felt like she washed the eye copiously to clear the body wash.  She however has persistent eye pain, there is light sensitivity and pain with movement of the eye.  She feels that there is a tear over her eyelid.  She does not wear contacts.  Home Medications Prior to Admission medications   Medication Sig Start Date End Date Taking? Authorizing Provider  ofloxacin (OCUFLOX) 0.3 % ophthalmic solution Place 1 drop into the right eye 4 (four) times daily for 7 days. 08/14/22 08/21/22 Yes Syair Fricker, MD  amphetamine-dextroamphetamine (ADDERALL) 5 MG tablet Take 5 mg by mouth 2 (two) times daily with a meal.    [provider]  enoxaparin (LOVENOX) 120 MG/0.8ML injection Inject 0.8 mLs (120 mg total) into the skin daily. 04/26/20 06/25/20  Roma Schanz, CNM      Allergies    Procardia [nifedipine]    Review of Systems   Review of Systems  Physical Exam Updated Vital Signs BP 120/80 (BP Location: Left Arm)   Pulse 81   Temp 98.6 F (37 C) (Oral)   Resp 16   Ht 5\' 4"  (1.626 m)   Wt 63.5 kg   LMP 06/20/2022 (Approximate)   SpO2 98%   BMI 24.03 kg/m  Physical Exam Vitals and nursing note reviewed.  Constitutional:      Appearance: She is well-developed.  HENT:     Head: Atraumatic.  Eyes:     Extraocular Movements: Extraocular movements intact.     Comments: Patient has slight edema of the upper and lower eyelid of the right eye.  Patient has positive chemosis and is tearing. Extraocular muscles  intact. Bedside gross visual acuity screen is normal with finger counting. Under [employer], patient has large corneal abrasion/ulceration evidence.  About 50% of coverage of dye uptake noted  Cardiovascular:     Rate and Rhythm: Normal rate.  Pulmonary:     Effort: Pulmonary effort is normal.  Musculoskeletal:     Cervical back: Normal range of motion and neck supple.  Skin:    General: Skin is dry.  Neurological:     Mental Status: She is alert and oriented to person, place, and time.     ED Results / Procedures / Treatments   Labs (all labs ordered are listed, but only abnormal results are displayed) Labs Reviewed - No data to display  EKG None  Radiology No results found.  Procedures Procedures    Medications Ordered in ED Medications  tetracaine (PONTOCAINE) 0.5 % ophthalmic solution 2 drop (2 drops Right Eye Given 08/14/22 0937)  fluorescein ophthalmic strip 1 strip (1 strip Left Eye Given 08/14/22 0937)  erythromycin ophthalmic ointment ( Right Eye Given 08/14/22 1025)  artificial tears (LACRILUBE) ophthalmic ointment ( Right Eye Given 08/14/22 1025)    ED Course/ Medical Decision Making/ A&P  Medical Decision Making Risk Prescription drug management.   32 year old female comes in with chief complaint of eye pain.  Differential diagnosis for her includes corneal abrasion, corneal ulcer, eye contusion, chemical injury to the eye.  Eyelid contusion also considered in the differential given that it is swollen.  Patient's exam is overall reassuring.  Gross visual exam is also reassuring.  Our exam does confirm large corneal abrasion, we will treat this like corneal ulcer.  Patient advised to follow-up with ophthalmologist in 1 to 2 days.  Return precautions discussed.  She will return if she has worsening pain, change in vision or loss of vision.  Will give her fluoroquinolone.  Final Clinical Impression(s) / ED Diagnoses Final  diagnoses:  Abrasion of right cornea, initial encounter    Rx / DC Orders ED Discharge Orders          Ordered    erythromycin ophthalmic ointment  Status:  Discontinued        08/14/22 1018    ofloxacin (OCUFLOX) 0.3 % ophthalmic solution  4 times daily        08/14/22 1019              Derwood Kaplan, MD 08/14/22 1048

## 2022-08-29 ENCOUNTER — Ambulatory Visit: Payer: Medicaid Other | Admitting: Neurology

## 2023-07-11 ENCOUNTER — Ambulatory Visit: Payer: Medicaid Other | Admitting: Dermatology

## 2023-07-29 ENCOUNTER — Encounter: Payer: Self-pay | Admitting: Dermatology

## 2023-07-29 ENCOUNTER — Ambulatory Visit (INDEPENDENT_AMBULATORY_CARE_PROVIDER_SITE_OTHER): Admitting: Dermatology

## 2023-07-29 VITALS — BP 97/64

## 2023-07-29 DIAGNOSIS — L659 Nonscarring hair loss, unspecified: Secondary | ICD-10-CM

## 2023-07-29 DIAGNOSIS — L739 Follicular disorder, unspecified: Secondary | ICD-10-CM

## 2023-07-29 DIAGNOSIS — L810 Postinflammatory hyperpigmentation: Secondary | ICD-10-CM | POA: Diagnosis not present

## 2023-07-29 DIAGNOSIS — L700 Acne vulgaris: Secondary | ICD-10-CM

## 2023-07-29 MED ORDER — CLINDAMYCIN PHOSPHATE 1 % EX SWAB: 1.0000 "application " | Freq: Every morning | CUTANEOUS | 3 refills | Status: AC

## 2023-07-29 MED ORDER — SPIRONOLACTONE 50 MG PO TABS: 50.0000 mg | ORAL_TABLET | Freq: Every day | ORAL | 3 refills | Status: AC

## 2023-07-29 MED ORDER — CLOBETASOL PROPIONATE 0.05 % EX SOLN: 1.0000 | Freq: Every day | CUTANEOUS | 2 refills | Status: AC

## 2023-07-29 MED ORDER — TRETINOIN 0.05 % EX CREA: TOPICAL_CREAM | Freq: Every day | CUTANEOUS | 3 refills | Status: AC

## 2023-07-29 NOTE — Patient Instructions (Addendum)
 Date: Mon Jul 29 2023  Hello Bridget Mcbride,  Thank you for visiting today. Here is a summary of the key instructions:  Medications: - Apply clobetasol once a day to scalp area until resolved - Take spironolactone 50 mg daily with dinner - Apply clindamycin  swap every morning after washing face - Apply tretinoin 0.05% at night, starting with two nights a week for the first month   - Increase to three nights a week after 3-4 weeks  Skincare Routine: - Morning: Wash face, apply clindamycin , then moisturizer - Night: Apply tretinoin (as directed above)  Lifestyle Changes: - Drink enough water to avoid side effects like cramping and nausea - Stop taking creatine supplements  Follow-up: - Return for follow-up appointment in 4 months  Other Instructions: - Watch for symptoms like lightheadedness or dizziness while taking spironolactone - Contact the office through MyChart if any issues arise  Please reach out if you have any questions or concerns.  Warm regards,  Dr. Louana Roup, Dermatology         Important Information  Due to recent changes in healthcare laws, you may see results of your pathology and/or laboratory studies on MyChart before the doctors have had a chance to review them. We understand that in some cases there may be results that are confusing or concerning to you. Please understand that not all results are received at the same time and often the doctors may need to interpret multiple results in order to provide you with the best plan of care or course of treatment. Therefore, we ask that you please give us  2 business days to thoroughly review all your results before contacting the office for clarification. Should we see a critical lab result, you will be contacted sooner.   If You Need Anything After Your Visit  If you have any questions or concerns for your doctor, please call our main line at [phone] If no one answers, please leave a voicemail as  directed and we will return your call as soon as possible. Messages left after 4 pm will be answered the following business day.   You may also send us  a message via MyChart. We typically respond to MyChart messages within 1-2 business days.  For prescription refills, please ask your pharmacy to contact our office. Our fax number is [phone].  If you have an urgent issue when the clinic is closed that cannot wait until the next business day, you can page your doctor at the number below.    Please note that while we do our best to be available for urgent issues outside of office hours, we are not available 24/7.   If you have an urgent issue and are unable to reach us , you may choose to seek medical care at your doctor's office, retail clinic, urgent care center, or emergency room.  If you have a medical emergency, please immediately call 911 or go to the emergency department. In the event of inclement weather, please call our main line at [phone] for an update on the status of any delays or closures.  Dermatology Medication Tips: Please keep the boxes that topical medications come in in order to help keep track of the instructions about where and how to use these. Pharmacies typically print the medication instructions only on the boxes and not directly on the medication tubes.   If your medication is too expensive, please contact our office at [phone] or send us  a message through MyChart.   We are unable to  tell what your co-pay for medications will be in advance as this is different depending on your insurance coverage. However, we may be able to find a substitute medication at lower cost or fill out paperwork to get insurance to cover a needed medication.   If a prior authorization is required to get your medication covered by your insurance company, please allow us  1-2 business days to complete this process.  Drug prices often vary depending on where the prescription is  filled and some pharmacies may offer cheaper prices.  The website www.goodrx.com contains coupons for medications through different pharmacies. The prices here do not account for what the cost may be with help from insurance (it may be cheaper with your insurance), but the website can give you the price if you did not use any insurance.  - You can print the associated coupon and take it with your prescription to the pharmacy.  - You may also stop by our office during regular business hours and pick up a GoodRx coupon card.  - If you need your prescription sent electronically to a different pharmacy, notify our office through [facility] or by phone at [phone]

## 2023-07-29 NOTE — Progress Notes (Signed)
   New Patient Visit   Subjective  Bridget Mcbride is a 33 y.o. female who presents for the following: Bump of scalp x ~1 year. It was giving her migraines at one point but it is not sensitive now. She has acne of her face. She was diagnosed with PCOS. The acne started clearing about 6 months after she had her daughter. It stayed that way for about 2 1/2 years but has recently started breaking out again. She saw a dermatologist years ago but nothing he did worked. She also noticed flares with creatine. She has a mole on her left lower leg that just popped up. Her PCP said it could be like a scar.    The following portions of the chart were reviewed this encounter and updated as appropriate: medications, allergies, medical history  Review of Systems:  No other skin or systemic complaints except as noted in HPI or Assessment and Plan.  Objective  Well appearing patient in no apparent distress; mood and affect are within normal limits.   A focused examination was performed of the following areas: Scalp, face, left leg  Relevant exam findings are noted in the Assessment and Plan.               Assessment & Plan   1. Acne vulgaris and PIH - Assessment:  Lifelong history of acne, resolved after childbirth but recurred. Recent exacerbation noted with creatine supplement use. Previous treatment included oral doxycycline. Patient has a history of PCOS, which may contribute to hormonal acne. Current presentation suggests moderate inflammatory acne requiring both topical and systemic management.  - Plan:    Start spironolactone 50 mg PO daily with dinner for hormonal acne     - Informed patient to monitor for side effects such as lightheadedness or dizziness    Start clindamycin  topical solution every morning after face washing, followed by moisturizer    Start tretinoin 0.05% topical cream at night     - Begin with twice weekly application for the first month     - Increase  to three times weekly after 3-4 weeks if tolerated    Advised to discontinue creatine supplements due to acne exacerbation    Encouraged adequate hydration to mitigate potential side effects like cramping and nausea    Follow-up appointment in 4 months  2. Resolved scalp lesion (Folliculitis) with residual alopecia - Assessment:  Patient reported a scalp lesion that persisted for approximately one year, described as a bump without hair, resembling an inflamed hair follicle. The lesion was associated with pressure, pain, and migraines for 3 months. Currently, the bump has resolved, but residual hair thinning remains at the site.  - Plan:    Prescribe clobetasol topical solution for potential underlying inflammation     - Apply once daily until resolved    Monitor for improvement of hair thinning     Return in about 4 months (around 11/28/2023) for Acne.  I, Eliot Guernsey, CMA, am acting as scribe for [employer], DO .   Documentation: I have reviewed the above documentation for accuracy and completeness, and I agree with the above.  Louana Roup, DO

## 2023-10-25 ENCOUNTER — Other Ambulatory Visit: Payer: Self-pay | Admitting: Obstetrics and Gynecology

## 2023-10-29 LAB — SURGICAL PATHOLOGY

## 2023-12-25 ENCOUNTER — Ambulatory Visit: Admitting: Dermatology

## 2024-02-06 ENCOUNTER — Telehealth: Admitting: Physician Assistant

## 2024-02-06 DIAGNOSIS — B9689 Other specified bacterial agents as the cause of diseases classified elsewhere: Secondary | ICD-10-CM | POA: Diagnosis not present

## 2024-02-06 DIAGNOSIS — J208 Acute bronchitis due to other specified organisms: Secondary | ICD-10-CM | POA: Diagnosis not present

## 2024-02-06 MED ORDER — IPRATROPIUM BROMIDE 0.03 % NA SOLN: 2.0000 | Freq: Two times a day (BID) | NASAL | 0 refills | Status: AC

## 2024-02-06 MED ORDER — AMOXICILLIN-POT CLAVULANATE 875-125 MG PO TABS: 1.0000 | ORAL_TABLET | Freq: Two times a day (BID) | ORAL | 0 refills | Status: AC

## 2024-02-06 NOTE — Progress Notes (Signed)
 Virtual Visit Consent   Bridget Mcbride, you are scheduled for a virtual visit with a [place] provider today. Just as with appointments in the office, your consent must be obtained to participate. Your consent will be active for this visit and any virtual visit you may have with one of our providers in the next 365 days. If you have a MyChart account, a copy of this consent can be sent to you electronically.  As this is a virtual visit, video technology does not allow for your provider to perform a traditional examination. This may limit your provider's ability to fully assess your condition. If your provider identifies any concerns that need to be evaluated in person or the need to arrange testing (such as labs, EKG, etc.), we will make arrangements to do so. Although advances in technology are sophisticated, we cannot ensure that it will always work on either your end or our end. If the connection with a video visit is poor, the visit may have to be switched to a telephone visit. With either a video or telephone visit, we are not always able to ensure that we have a secure connection.  By engaging in this virtual visit, you consent to the provision of healthcare and authorize for your insurance to be billed (if applicable) for the services provided during this visit. Depending on your insurance coverage, you may receive a charge related to this service.  I need to obtain your verbal consent now. Are you willing to proceed with your visit today? Bridget Mcbride has provided verbal consent on 02/06/2024 for a virtual visit (video or telephone). Bridget Mcbride, [place]  Date: 02/06/2024 3:54 PM   Virtual Visit via Video Note   I, Bridget Mcbride, connected with  Bridget Mcbride  ([id number], Mar 22, 1990) on 02/06/2024 at  3:45 PM EST by a video-enabled telemedicine application and verified that I am speaking with the correct person using two  identifiers.  Location: Patient: Virtual Visit Location Patient: Home Provider: Virtual Visit Location Provider: Home Office   I discussed the limitations of evaluation and management by telemedicine and the availability of in person appointments. The patient expressed understanding and agreed to proceed.    History of Present Illness: Bridget Mcbride is a 32 y.o. who identifies as a female who was assigned female at birth, and is being seen today for chest congestion with productive cough over the past 3.5 weeks. Denies fever, chills, aches. Cough is productive of thick sputum. Notes sinus congestion and sinus pain. Notes talking will trigger coughing spells. Congestion worse on waking in the morning. As it clears it gets a bit better throughout the day.   OTC -- Dayquil/Nyquil, Alka Seltzer, Theraflu.  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  Resting comfortably  at home.  Head is normocephalic, atraumatic.  No labored breathing.  Speech is clear and coherent with logical content.  Patient is alert and oriented at baseline.   Assessment and Plan: 1. Acute bacterial bronchitis (Primary) - ipratropium (ATROVENT ) 0.03 % nasal spray; Place 2 sprays into both nostrils every 12 (twelve)  hours.  Dispense: 30 mL; Refill: 0 - promethazine -dextromethorphan (PROMETHAZINE -DM) 6.25-15 MG/5ML syrup; Take 5 mLs by mouth 4 (four) times daily as needed for cough.  Dispense: 118 mL; Refill: 0 - amoxicillin -clavulanate (AUGMENTIN ) 875-125 MG  tablet; Take 1 tablet by mouth 2 (two) times daily.  Dispense: 14 tablet; Refill: 0  Rx Augmentin .  Increase fluids.  Rest.  Saline nasal spray.  Probiotic.  Mucinex as directed.  Humidifier in bedroom. Promethazine -DM and Atrovent  nasal spray per orders.  Call or return to clinic if symptoms are not improving.   Follow Up Instructions: I discussed the assessment and treatment plan with the patient. The patient was provided an opportunity to ask questions and all were answered. The patient agreed with the plan and demonstrated an understanding of the instructions.  A copy of instructions were sent to the patient via MyChart unless otherwise noted below.   The patient was advised to call back or seek an in-person evaluation if the symptoms worsen or if the condition fails to improve as anticipated.    Bridget Velma Lunger, PA-C    [1]  Allergies Allergen Reactions   Procardia  [Nifedipine ] Other (See Comments)    Migraine  [2]  Current Outpatient Medications:    amoxicillin -clavulanate (AUGMENTIN ) 875-125 MG tablet, Take 1 tablet by mouth 2 (two) times daily., Disp: 14 tablet, Rfl: 0   ipratropium (ATROVENT ) 0.03 % nasal spray, Place 2 sprays into both nostrils every 12 (twelve) hours., Disp: 30 mL, Rfl: 0   promethazine -dextromethorphan (PROMETHAZINE -DM) 6.25-15 MG/5ML syrup, Take 5 mLs by mouth 4 (four) times daily as needed for cough., Disp: 118 mL, Rfl: 0   amphetamine-dextroamphetamine (ADDERALL) 5 MG tablet, Take 5 mg by mouth 2 (two) times daily with a meal., Disp: , Rfl:    clindamycin  (CLEOCIN  T) 1 % SWAB, Apply 1 application  topically every morning., Disp: 30 each, Rfl: 3   clobetasol  (TEMOVATE ) 0.05 % external solution, Apply 1 Application topically daily. Apply to affected area of scalp daily until resolved., Disp: 50 mL, Rfl: 2   spironolactone  (ALDACTONE ) 50 MG tablet, Take 1 tablet (50 mg total) by mouth daily., Disp: 30 tablet, Rfl: 3   tretinoin  (RETIN-A ) 0.05 % cream,  Apply topically at bedtime. Apply pea size amount to face at bedtime 2 nights per week x 1 month and increase to 3 nights per week as tolerated, Disp: 45 g, Rfl: 3

## 2024-02-06 NOTE — Patient Instructions (Signed)
 Bridget Mcbride, thank you for joining Bridget Velma Lunger, PA-C for today's virtual visit.  While this provider is not your primary care provider (PCP), if your PCP is located in our provider database this encounter information will be shared with them immediately following your visit.   A [facility] MyChart account gives you access to today's visit and all your visits, tests, and labs performed at [facility]  click here if you don't have a [facility] MyChart account or go to [email or web address]  Consent: (Patient) Bridget Mcbride provided verbal consent for this virtual visit at the beginning of the encounter.  Current Medications:  Current Outpatient Medications:    amphetamine-dextroamphetamine (ADDERALL) 5 MG tablet, Take 5 mg by mouth 2 (two) times daily with a meal., Disp: , Rfl:    clindamycin  (CLEOCIN  T) 1 % SWAB, Apply 1 application  topically every morning., Disp: 30 each, Rfl: 3   clobetasol  (TEMOVATE ) 0.05 % external solution, Apply 1 Application topically daily. Apply to affected area of scalp daily until resolved., Disp: 50 mL, Rfl: 2   enoxaparin  (LOVENOX ) 120 MG/0.8ML injection, Inject 0.8 mLs (120 mg total) into the skin daily., Disp: 48 mL, Rfl: 0   spironolactone  (ALDACTONE ) 50 MG tablet, Take 1 tablet (50 mg total) by mouth daily., Disp: 30 tablet, Rfl: 3   tretinoin  (RETIN-A ) 0.05 % cream, Apply topically at bedtime. Apply pea size amount to face at bedtime 2 nights per week x 1 month and increase to 3 nights per week as tolerated, Disp: 45 g, Rfl: 3   Medications ordered in this encounter:  No orders of the defined types were placed in this encounter.    *If you need refills on other medications prior to your next appointment, please contact your pharmacy*  Follow-Up: Call back or seek an in-person evaluation if the symptoms worsen or if the condition fails to improve as anticipated.  [facility] [phone]  Other  Instructions Please take antibiotic as directed.  Increase fluid intake.  Use Saline nasal spray.  Take a daily multivitamin. Take all prescribed medications as directed.  Place a humidifier in the bedroom.  Please call or return clinic if symptoms are not improving.  Sinusitis Sinusitis is redness, soreness, and swelling (inflammation) of the paranasal sinuses. Paranasal sinuses are air pockets within the bones of your face (beneath the eyes, the middle of the forehead, or above the eyes). In healthy paranasal sinuses, mucus is able to drain out, and air is able to circulate through them by way of your nose. However, when your paranasal sinuses are inflamed, mucus and air can become trapped. This can allow bacteria and other germs to grow and cause infection. Sinusitis can develop quickly and last only a short time (acute) or continue over a long period (chronic). Sinusitis that lasts for more than 12 weeks is considered chronic.  CAUSES  Causes of sinusitis include: Allergies. Structural abnormalities, such as displacement of the cartilage that separates your nostrils (deviated septum), which can decrease the air flow through your nose and sinuses and affect sinus drainage. Functional abnormalities, such as when the small hairs (cilia) that line your sinuses and help remove mucus do not work properly or are not present. SYMPTOMS  Symptoms of acute and chronic sinusitis are the same. The primary symptoms are pain and pressure around the affected sinuses. Other symptoms include: Upper toothache. Earache. Headache. Bad breath. Decreased sense of smell and taste. A cough, which worsens when you are  lying flat. Fatigue. Fever. Thick drainage from your nose, which often is green and may contain pus (purulent). Swelling and warmth over the affected sinuses. DIAGNOSIS  Your caregiver will perform a physical exam. During the exam, your caregiver may: Look in your nose for signs of abnormal growths  in your nostrils (nasal polyps). Tap over the affected sinus to check for signs of infection. View the inside of your sinuses (endoscopy) with a special imaging device with a light attached (endoscope), which is inserted into your sinuses. If your caregiver suspects that you have chronic sinusitis, one or more of the following tests may be recommended: Allergy tests. Nasal culture A sample of mucus is taken from your nose and sent to a lab and screened for bacteria. Nasal cytology A sample of mucus is taken from your nose and examined by your caregiver to determine if your sinusitis is related to an allergy. TREATMENT  Most cases of acute sinusitis are related to a viral infection and will resolve on their own within 10 days. Sometimes medicines are prescribed to help relieve symptoms (pain medicine, decongestants, nasal steroid sprays, or saline sprays).  However, for sinusitis related to a bacterial infection, your caregiver will prescribe antibiotic medicines. These are medicines that will help kill the bacteria causing the infection.  Rarely, sinusitis is caused by a fungal infection. In theses cases, your caregiver will prescribe antifungal medicine. For some cases of chronic sinusitis, surgery is needed. Generally, these are cases in which sinusitis recurs more than 3 times per year, despite other treatments. HOME CARE INSTRUCTIONS  Drink plenty of water. Water helps thin the mucus so your sinuses can drain more easily. Use a humidifier. Inhale steam 3 to 4 times a day (for example, sit in the bathroom with the shower running). Apply a warm, moist washcloth to your face 3 to 4 times a day, or as directed by your caregiver. Use saline nasal sprays to help moisten and clean your sinuses. Take over-the-counter or prescription medicines for pain, discomfort, or fever only as directed by your caregiver. SEEK IMMEDIATE MEDICAL CARE IF: You have increasing pain or severe headaches. You have  nausea, vomiting, or drowsiness. You have swelling around your face. You have vision problems. You have a stiff neck. You have difficulty breathing. MAKE SURE YOU:  Understand these instructions. Will watch your condition. Will get help right away if you are not doing well or get worse. Document Released: 02/05/2005 Document Revised: 04/30/2011 Document Reviewed: 02/20/2011 [facility] Patient Information 2014 [place], [place].    If you have been instructed to have an in-person evaluation today at a local Urgent Care facility, please use the link below. It will take you to a list of all of our available [facility], including address, phone number and hours of operation. Please do not delay care.  [facility]  If you or a family member do not have a primary care provider, use the link below to schedule a visit and establish care. When you choose a [facility] primary care physician or advanced practice provider, you gain a long-term partner in health. Find a Primary Care Provider  Learn more about [facility]'s in-office and virtual care options: [facility] - Get Care Now

## 2024-02-22 ENCOUNTER — Telehealth

## 2024-02-22 DIAGNOSIS — J029 Acute pharyngitis, unspecified: Secondary | ICD-10-CM

## 2024-02-23 MED ORDER — LIDOCAINE VISCOUS HCL 2 % MT SOLN: 15.0000 mL | OROMUCOSAL | 0 refills | Status: AC | PRN

## 2024-02-23 NOTE — Progress Notes (Signed)
 We are sorry that you are not feeling well.  Here is how we plan to help! Vocal hoarseness or laryngitis can accompany viral pharyngitis.  Upon close examination of the attached picture there does not appear to be any bacterial infection present.   Your symptoms indicate a likely viral infection (Pharyngitis).   Pharyngitis is inflammation in the back of the throat which can cause a sore throat, scratchiness and sometimes difficulty swallowing.   Pharyngitis is typically caused by a respiratory virus and will just run its course.  Please keep in mind that your symptoms could last up to 10 days.    For throat pain, we recommend over the counter oral pain relief medications such as acetaminophen  or aspirin, or anti-inflammatory medications such as ibuprofen  or naproxen  sodium.  Topical treatments such as oral throat lozenges or sprays may be used as needed. For throat pain, I have prescribed a Viscous Lidocaine  2% solution. Swallow 5-10 mL every 4-6 hours as needed for sore throat. DO NOT eat or drink anything for 15-20 minutes after swallowing to allow the medication to coat the throat.Bridget Mcbride  Avoid close contact with loved ones, especially the very young and elderly.  Remember to wash your hands thoroughly throughout the day as this is the number one way to prevent the spread of infection! We also recommend that you periodically wipe down door knobs and counters with disinfectant.  After careful review of your answers, I would not recommend and antibiotic for your condition.  Antibiotics should not be used to treat conditions that we suspect are caused by viruses like the virus that causes the common cold or flu.  Providers prescribe antibiotics to treat infections caused by bacteria. Antibiotics are very powerful in treating bacterial infections when they are used properly.  To maintain their effectiveness, they should be used only when necessary.  Overuse of antibiotics has resulted in the development of super  bugs that are resistant to treatment!    Some people with strep throat, however, can have atypical symptoms. As such, if anything continued to progress despite treatment recommendations, you may need formal testing in clinic or office.  Home Care: Only take medications as instructed by your medical team. Do not drink alcohol while taking these medications. A steam or ultrasonic humidifier can help congestion.  You can place a towel over your head and breathe in the steam from hot water coming from a faucet. Avoid close contacts especially the very young and the elderly. Cover your mouth when you cough or sneeze. Always remember to wash your hands.  Get Help Right Away If: You develop worsening fever or throat pain. You develop a severe head ache or visual changes. Your symptoms persist after you have completed your treatment plan.  Make sure you Understand these instructions. Will watch your condition. Will get help right away if you are not doing well or get worse.  Your e-visit answers were reviewed by a board certified advanced clinical practitioner to complete your personal care plan.  Depending on the condition, your plan could have included both over the counter or prescription medications.  If there is a problem please reply once you have received a response from your provider.  Your safety is important to us .  If you have drug allergies check your prescription carefully.    You can use MyChart to ask questions about todays visit, request a non-urgent call back, or ask for a work or school excuse for 24 hours related to this e-Visit.  If it has been greater than 24 hours you will need to follow up with your provider, or enter a new e-Visit to address those concerns.  You will get an e-mail in the next two days asking about your experience.  I hope that your e-visit has been valuable and will speed your recovery. Thank you for using e-visits.   I have spent 5 minutes in review  of e-visit questionnaire, review and updating patient chart, medical decision making and response to patient.   Bridget Mcbride Bridget Essman, NP

## 2024-02-24 ENCOUNTER — Encounter: Payer: Self-pay | Admitting: Family Medicine

## 2024-02-24 DIAGNOSIS — J029 Acute pharyngitis, unspecified: Secondary | ICD-10-CM

## 2024-02-24 NOTE — Telephone Encounter (Signed)
 Patient is requesting prednisone  to be sent in?

## 2024-02-25 MED ORDER — PREDNISONE 10 MG PO TABS: ORAL_TABLET | ORAL | 0 refills | Status: AC
